# Patient Record
Sex: Female | Born: 1935 | Race: White | Hispanic: No | Smoking: Never smoker
Health system: Southern US, Community
[De-identification: ages and names within clinical notes are randomized; demographics above are authoritative.]

## PROBLEM LIST (undated history)

## (undated) DIAGNOSIS — K219 Gastro-esophageal reflux disease without esophagitis: Secondary | ICD-10-CM

## (undated) DIAGNOSIS — J439 Emphysema, unspecified: Secondary | ICD-10-CM

## (undated) DIAGNOSIS — D510 Vitamin B12 deficiency anemia due to intrinsic factor deficiency: Secondary | ICD-10-CM

## (undated) DIAGNOSIS — M545 Low back pain, unspecified: Secondary | ICD-10-CM

## (undated) DIAGNOSIS — G8929 Other chronic pain: Secondary | ICD-10-CM

## (undated) DIAGNOSIS — S32599A Other specified fracture of unspecified pubis, initial encounter for closed fracture: Secondary | ICD-10-CM

## (undated) DIAGNOSIS — IMO0002 Reserved for concepts with insufficient information to code with codable children: Secondary | ICD-10-CM

## (undated) DIAGNOSIS — Z8619 Personal history of other infectious and parasitic diseases: Secondary | ICD-10-CM

## (undated) DIAGNOSIS — R609 Edema, unspecified: Secondary | ICD-10-CM

## (undated) DIAGNOSIS — N182 Chronic kidney disease, stage 2 (mild): Secondary | ICD-10-CM

## (undated) DIAGNOSIS — G629 Polyneuropathy, unspecified: Secondary | ICD-10-CM

## (undated) DIAGNOSIS — T7840XA Allergy, unspecified, initial encounter: Secondary | ICD-10-CM

## (undated) DIAGNOSIS — E669 Obesity, unspecified: Secondary | ICD-10-CM

## (undated) DIAGNOSIS — T50905A Adverse effect of unspecified drugs, medicaments and biological substances, initial encounter: Secondary | ICD-10-CM

## (undated) DIAGNOSIS — R739 Hyperglycemia, unspecified: Secondary | ICD-10-CM

## (undated) DIAGNOSIS — E538 Deficiency of other specified B group vitamins: Secondary | ICD-10-CM

## (undated) DIAGNOSIS — I10 Essential (primary) hypertension: Secondary | ICD-10-CM

## (undated) DIAGNOSIS — D696 Thrombocytopenia, unspecified: Secondary | ICD-10-CM

## (undated) HISTORY — PX: ROTATOR CUFF REPAIR: SHX139

## (undated) HISTORY — PX: VEIN SURGERY: SHX48

## (undated) HISTORY — DX: Polyneuropathy, unspecified: G62.9

## (undated) HISTORY — DX: Essential (primary) hypertension: I10

## (undated) HISTORY — PX: ORIF ANKLE FRACTURE: SHX5408

## (undated) HISTORY — DX: Deficiency of other specified B group vitamins: E53.8

## (undated) HISTORY — DX: Low back pain: M54.5

## (undated) HISTORY — DX: Allergy, unspecified, initial encounter: T78.40XA

## (undated) HISTORY — DX: Chronic kidney disease, stage 2 (mild): N18.2

## (undated) HISTORY — PX: TOTAL ABDOMINAL HYSTERECTOMY W/ BILATERAL SALPINGOOPHORECTOMY: SHX83

## (undated) HISTORY — DX: Other chronic pain: G89.29

## (undated) HISTORY — DX: Reserved for concepts with insufficient information to code with codable children: IMO0002

## (undated) HISTORY — DX: Low back pain, unspecified: M54.50

## (undated) HISTORY — DX: Gastro-esophageal reflux disease without esophagitis: K21.9

## (undated) HISTORY — PX: CARPAL TUNNEL RELEASE: SHX101

## (undated) HISTORY — DX: Vitamin B12 deficiency anemia due to intrinsic factor deficiency: D51.0

## (undated) HISTORY — DX: Edema, unspecified: R60.9

## (undated) HISTORY — DX: Obesity, unspecified: E66.9

## (undated) HISTORY — DX: Personal history of other infectious and parasitic diseases: Z86.19

---

## 1999-12-13 ENCOUNTER — Other Ambulatory Visit: Admission: RE | Admit: 1999-12-13 | Discharge: 1999-12-13 | Payer: Self-pay | Admitting: Radiology

## 1999-12-13 ENCOUNTER — Encounter (INDEPENDENT_AMBULATORY_CARE_PROVIDER_SITE_OTHER): Payer: Self-pay | Admitting: Specialist

## 2001-10-13 ENCOUNTER — Ambulatory Visit (HOSPITAL_COMMUNITY): Admission: RE | Admit: 2001-10-13 | Discharge: 2001-10-13 | Payer: Self-pay | Admitting: Family Medicine

## 2001-10-13 ENCOUNTER — Encounter: Payer: Self-pay | Admitting: Family Medicine

## 2003-04-26 ENCOUNTER — Encounter: Payer: Self-pay | Admitting: Family Medicine

## 2003-04-26 ENCOUNTER — Ambulatory Visit (HOSPITAL_COMMUNITY): Admission: RE | Admit: 2003-04-26 | Discharge: 2003-04-26 | Payer: Self-pay | Admitting: Family Medicine

## 2003-05-19 ENCOUNTER — Ambulatory Visit (HOSPITAL_COMMUNITY): Admission: RE | Admit: 2003-05-19 | Discharge: 2003-05-19 | Payer: Self-pay | Admitting: Internal Medicine

## 2003-05-19 ENCOUNTER — Encounter: Payer: Self-pay | Admitting: Internal Medicine

## 2003-05-20 ENCOUNTER — Encounter: Payer: Self-pay | Admitting: Family Medicine

## 2003-05-20 ENCOUNTER — Ambulatory Visit (HOSPITAL_COMMUNITY): Admission: RE | Admit: 2003-05-20 | Discharge: 2003-05-20 | Payer: Self-pay | Admitting: Family Medicine

## 2003-11-21 ENCOUNTER — Emergency Department (HOSPITAL_COMMUNITY): Admission: EM | Admit: 2003-11-21 | Discharge: 2003-11-21 | Payer: Self-pay | Admitting: Emergency Medicine

## 2005-05-11 ENCOUNTER — Ambulatory Visit (HOSPITAL_COMMUNITY): Admission: RE | Admit: 2005-05-11 | Discharge: 2005-05-11 | Payer: Self-pay | Admitting: Family Medicine

## 2006-01-21 ENCOUNTER — Ambulatory Visit (HOSPITAL_COMMUNITY): Admission: RE | Admit: 2006-01-21 | Discharge: 2006-01-21 | Payer: Self-pay | Admitting: Family Medicine

## 2006-03-06 ENCOUNTER — Emergency Department (HOSPITAL_COMMUNITY): Admission: EM | Admit: 2006-03-06 | Discharge: 2006-03-06 | Payer: Self-pay | Admitting: Emergency Medicine

## 2006-03-07 ENCOUNTER — Emergency Department (HOSPITAL_COMMUNITY): Admission: EM | Admit: 2006-03-07 | Discharge: 2006-03-07 | Payer: Self-pay | Admitting: Emergency Medicine

## 2006-09-16 ENCOUNTER — Ambulatory Visit (HOSPITAL_COMMUNITY): Admission: RE | Admit: 2006-09-16 | Discharge: 2006-09-16 | Payer: Self-pay | Admitting: General Surgery

## 2006-09-16 ENCOUNTER — Encounter (INDEPENDENT_AMBULATORY_CARE_PROVIDER_SITE_OTHER): Payer: Self-pay | Admitting: Specialist

## 2006-09-19 ENCOUNTER — Ambulatory Visit (HOSPITAL_COMMUNITY): Admission: RE | Admit: 2006-09-19 | Discharge: 2006-09-19 | Payer: Self-pay | Admitting: General Surgery

## 2006-09-26 ENCOUNTER — Ambulatory Visit (HOSPITAL_COMMUNITY): Admission: RE | Admit: 2006-09-26 | Discharge: 2006-09-26 | Payer: Self-pay | Admitting: *Deleted

## 2006-10-01 ENCOUNTER — Ambulatory Visit (HOSPITAL_COMMUNITY): Payer: Self-pay | Admitting: Oncology

## 2006-10-01 ENCOUNTER — Encounter (HOSPITAL_COMMUNITY): Admission: RE | Admit: 2006-10-01 | Discharge: 2006-10-21 | Payer: Self-pay | Admitting: Oncology

## 2006-10-02 ENCOUNTER — Encounter (INDEPENDENT_AMBULATORY_CARE_PROVIDER_SITE_OTHER): Payer: Self-pay | Admitting: Specialist

## 2006-10-03 ENCOUNTER — Ambulatory Visit (HOSPITAL_COMMUNITY): Admission: RE | Admit: 2006-10-03 | Discharge: 2006-10-03 | Payer: Self-pay | Admitting: Oncology

## 2006-10-23 ENCOUNTER — Encounter (HOSPITAL_COMMUNITY): Admission: RE | Admit: 2006-10-23 | Discharge: 2006-11-22 | Payer: Self-pay | Admitting: Oncology

## 2006-11-18 ENCOUNTER — Ambulatory Visit (HOSPITAL_COMMUNITY): Payer: Self-pay | Admitting: Oncology

## 2006-11-25 ENCOUNTER — Encounter (HOSPITAL_COMMUNITY): Admission: RE | Admit: 2006-11-25 | Discharge: 2006-12-25 | Payer: Self-pay | Admitting: Oncology

## 2007-01-27 ENCOUNTER — Encounter (HOSPITAL_COMMUNITY): Admission: RE | Admit: 2007-01-27 | Discharge: 2007-02-26 | Payer: Self-pay | Admitting: Oncology

## 2007-01-27 ENCOUNTER — Ambulatory Visit (HOSPITAL_COMMUNITY): Payer: Self-pay | Admitting: Oncology

## 2007-03-21 ENCOUNTER — Encounter (HOSPITAL_COMMUNITY): Admission: RE | Admit: 2007-03-21 | Discharge: 2007-04-20 | Payer: Self-pay | Admitting: Oncology

## 2007-03-21 ENCOUNTER — Ambulatory Visit (HOSPITAL_COMMUNITY): Payer: Self-pay | Admitting: Oncology

## 2007-03-24 ENCOUNTER — Ambulatory Visit: Payer: Self-pay | Admitting: Cardiology

## 2007-03-24 ENCOUNTER — Ambulatory Visit (HOSPITAL_COMMUNITY): Admission: RE | Admit: 2007-03-24 | Discharge: 2007-03-24 | Payer: Self-pay | Admitting: Cardiology

## 2007-03-25 ENCOUNTER — Ambulatory Visit: Payer: Self-pay | Admitting: Cardiology

## 2007-03-25 ENCOUNTER — Ambulatory Visit (HOSPITAL_COMMUNITY): Admission: RE | Admit: 2007-03-25 | Discharge: 2007-03-25 | Payer: Self-pay | Admitting: Cardiology

## 2007-04-15 ENCOUNTER — Ambulatory Visit: Payer: Self-pay | Admitting: Cardiology

## 2007-05-19 ENCOUNTER — Ambulatory Visit (HOSPITAL_COMMUNITY): Payer: Self-pay | Admitting: Oncology

## 2007-05-19 ENCOUNTER — Encounter (HOSPITAL_COMMUNITY): Admission: RE | Admit: 2007-05-19 | Discharge: 2007-06-18 | Payer: Self-pay | Admitting: Oncology

## 2007-06-30 ENCOUNTER — Encounter (HOSPITAL_COMMUNITY): Admission: RE | Admit: 2007-06-30 | Discharge: 2007-07-22 | Payer: Self-pay | Admitting: Oncology

## 2007-07-21 ENCOUNTER — Ambulatory Visit (HOSPITAL_COMMUNITY): Payer: Self-pay | Admitting: Oncology

## 2007-07-28 ENCOUNTER — Encounter (HOSPITAL_COMMUNITY): Admission: RE | Admit: 2007-07-28 | Discharge: 2007-08-27 | Payer: Self-pay | Admitting: Oncology

## 2007-09-03 ENCOUNTER — Encounter (HOSPITAL_COMMUNITY): Admission: RE | Admit: 2007-09-03 | Discharge: 2007-10-03 | Payer: Self-pay | Admitting: Oncology

## 2007-09-24 ENCOUNTER — Ambulatory Visit (HOSPITAL_COMMUNITY): Payer: Self-pay | Admitting: Oncology

## 2007-10-06 ENCOUNTER — Encounter (HOSPITAL_COMMUNITY): Admission: RE | Admit: 2007-10-06 | Discharge: 2007-10-22 | Payer: Self-pay | Admitting: Oncology

## 2007-11-03 ENCOUNTER — Encounter (HOSPITAL_COMMUNITY): Admission: RE | Admit: 2007-11-03 | Discharge: 2007-12-03 | Payer: Self-pay | Admitting: Oncology

## 2007-11-17 ENCOUNTER — Ambulatory Visit (HOSPITAL_COMMUNITY): Payer: Self-pay | Admitting: Oncology

## 2007-12-18 ENCOUNTER — Encounter (HOSPITAL_COMMUNITY): Admission: RE | Admit: 2007-12-18 | Discharge: 2008-01-17 | Payer: Self-pay | Admitting: Oncology

## 2008-01-12 ENCOUNTER — Ambulatory Visit (HOSPITAL_COMMUNITY): Payer: Self-pay | Admitting: Oncology

## 2008-01-29 ENCOUNTER — Encounter (HOSPITAL_COMMUNITY): Admission: RE | Admit: 2008-01-29 | Discharge: 2008-02-28 | Payer: Self-pay | Admitting: Oncology

## 2008-02-27 ENCOUNTER — Ambulatory Visit (HOSPITAL_COMMUNITY): Payer: Self-pay | Admitting: Oncology

## 2008-03-05 ENCOUNTER — Encounter (HOSPITAL_COMMUNITY): Admission: RE | Admit: 2008-03-05 | Discharge: 2008-04-04 | Payer: Self-pay | Admitting: Oncology

## 2008-04-15 ENCOUNTER — Ambulatory Visit (HOSPITAL_COMMUNITY): Admission: RE | Admit: 2008-04-15 | Discharge: 2008-04-15 | Payer: Self-pay | Admitting: Family Medicine

## 2008-04-21 ENCOUNTER — Ambulatory Visit (HOSPITAL_COMMUNITY): Payer: Self-pay | Admitting: Oncology

## 2008-04-21 ENCOUNTER — Encounter (HOSPITAL_COMMUNITY): Admission: RE | Admit: 2008-04-21 | Discharge: 2008-05-21 | Payer: Self-pay | Admitting: Oncology

## 2008-05-28 ENCOUNTER — Encounter (HOSPITAL_COMMUNITY): Admission: RE | Admit: 2008-05-28 | Discharge: 2008-06-27 | Payer: Self-pay | Admitting: Oncology

## 2008-06-25 ENCOUNTER — Ambulatory Visit (HOSPITAL_COMMUNITY): Payer: Self-pay | Admitting: Oncology

## 2008-07-13 ENCOUNTER — Encounter (HOSPITAL_COMMUNITY): Admission: RE | Admit: 2008-07-13 | Discharge: 2008-07-19 | Payer: Self-pay | Admitting: Oncology

## 2008-07-13 ENCOUNTER — Encounter (HOSPITAL_COMMUNITY): Payer: Self-pay | Admitting: Oncology

## 2008-07-19 ENCOUNTER — Ambulatory Visit (HOSPITAL_COMMUNITY): Admission: RE | Admit: 2008-07-19 | Discharge: 2008-07-19 | Payer: Self-pay | Admitting: Oncology

## 2008-07-28 ENCOUNTER — Encounter (HOSPITAL_COMMUNITY): Admission: RE | Admit: 2008-07-28 | Discharge: 2008-08-27 | Payer: Self-pay | Admitting: Oncology

## 2008-08-25 ENCOUNTER — Ambulatory Visit (HOSPITAL_COMMUNITY): Payer: Self-pay | Admitting: Oncology

## 2008-08-31 ENCOUNTER — Encounter (HOSPITAL_COMMUNITY): Admission: RE | Admit: 2008-08-31 | Discharge: 2008-09-30 | Payer: Self-pay | Admitting: Oncology

## 2008-10-05 ENCOUNTER — Encounter (HOSPITAL_COMMUNITY): Admission: RE | Admit: 2008-10-05 | Discharge: 2008-11-04 | Payer: Self-pay | Admitting: Oncology

## 2008-10-12 ENCOUNTER — Ambulatory Visit (HOSPITAL_COMMUNITY): Admission: RE | Admit: 2008-10-12 | Discharge: 2008-10-12 | Payer: Self-pay | Admitting: Ophthalmology

## 2008-10-15 ENCOUNTER — Emergency Department (HOSPITAL_COMMUNITY): Admission: EM | Admit: 2008-10-15 | Discharge: 2008-10-15 | Payer: Self-pay | Admitting: Emergency Medicine

## 2008-10-26 ENCOUNTER — Ambulatory Visit (HOSPITAL_COMMUNITY): Admission: RE | Admit: 2008-10-26 | Discharge: 2008-10-26 | Payer: Self-pay | Admitting: Family Medicine

## 2008-10-28 ENCOUNTER — Emergency Department (HOSPITAL_COMMUNITY): Admission: EM | Admit: 2008-10-28 | Discharge: 2008-10-28 | Payer: Self-pay | Admitting: Emergency Medicine

## 2008-11-02 ENCOUNTER — Ambulatory Visit (HOSPITAL_COMMUNITY): Admission: RE | Admit: 2008-11-02 | Discharge: 2008-11-02 | Payer: Self-pay | Admitting: Ophthalmology

## 2008-11-05 ENCOUNTER — Encounter (HOSPITAL_COMMUNITY): Admission: RE | Admit: 2008-11-05 | Discharge: 2008-12-05 | Payer: Self-pay | Admitting: Oncology

## 2008-11-05 ENCOUNTER — Ambulatory Visit (HOSPITAL_COMMUNITY): Payer: Self-pay | Admitting: Oncology

## 2008-12-07 ENCOUNTER — Encounter (HOSPITAL_COMMUNITY): Admission: RE | Admit: 2008-12-07 | Discharge: 2009-01-06 | Payer: Self-pay | Admitting: Oncology

## 2008-12-21 ENCOUNTER — Ambulatory Visit (HOSPITAL_COMMUNITY): Payer: Self-pay | Admitting: Oncology

## 2008-12-24 ENCOUNTER — Encounter (INDEPENDENT_AMBULATORY_CARE_PROVIDER_SITE_OTHER): Payer: Self-pay | Admitting: *Deleted

## 2009-01-18 ENCOUNTER — Encounter (HOSPITAL_COMMUNITY): Admission: RE | Admit: 2009-01-18 | Discharge: 2009-02-18 | Payer: Self-pay | Admitting: Oncology

## 2009-01-19 ENCOUNTER — Encounter: Admission: RE | Admit: 2009-01-19 | Discharge: 2009-01-19 | Payer: Self-pay | Admitting: Orthopedic Surgery

## 2009-03-01 ENCOUNTER — Encounter (HOSPITAL_COMMUNITY): Admission: RE | Admit: 2009-03-01 | Discharge: 2009-03-31 | Payer: Self-pay | Admitting: Oncology

## 2009-03-01 ENCOUNTER — Ambulatory Visit (HOSPITAL_COMMUNITY): Payer: Self-pay | Admitting: Oncology

## 2009-03-23 ENCOUNTER — Encounter: Payer: Self-pay | Admitting: Cardiology

## 2009-03-29 ENCOUNTER — Encounter (HOSPITAL_COMMUNITY): Admission: RE | Admit: 2009-03-29 | Discharge: 2009-04-28 | Payer: Self-pay | Admitting: Oncology

## 2009-04-20 ENCOUNTER — Ambulatory Visit (HOSPITAL_COMMUNITY): Payer: Self-pay | Admitting: Oncology

## 2009-04-29 DIAGNOSIS — R609 Edema, unspecified: Secondary | ICD-10-CM | POA: Insufficient documentation

## 2009-04-29 DIAGNOSIS — I10 Essential (primary) hypertension: Secondary | ICD-10-CM

## 2009-05-10 ENCOUNTER — Encounter (HOSPITAL_COMMUNITY): Admission: RE | Admit: 2009-05-10 | Discharge: 2009-06-09 | Payer: Self-pay | Admitting: Oncology

## 2009-06-03 ENCOUNTER — Encounter (INDEPENDENT_AMBULATORY_CARE_PROVIDER_SITE_OTHER): Payer: Self-pay | Admitting: *Deleted

## 2009-06-09 ENCOUNTER — Ambulatory Visit (HOSPITAL_COMMUNITY): Payer: Self-pay | Admitting: Oncology

## 2009-06-23 ENCOUNTER — Encounter (HOSPITAL_COMMUNITY): Admission: RE | Admit: 2009-06-23 | Discharge: 2009-07-21 | Payer: Self-pay | Admitting: Oncology

## 2009-08-04 ENCOUNTER — Ambulatory Visit (HOSPITAL_COMMUNITY): Payer: Self-pay | Admitting: Oncology

## 2009-08-04 ENCOUNTER — Encounter (HOSPITAL_COMMUNITY): Admission: RE | Admit: 2009-08-04 | Discharge: 2009-09-03 | Payer: Self-pay | Admitting: Oncology

## 2009-09-19 ENCOUNTER — Ambulatory Visit (HOSPITAL_COMMUNITY): Payer: Self-pay | Admitting: Oncology

## 2009-09-19 ENCOUNTER — Encounter (HOSPITAL_COMMUNITY): Admission: RE | Admit: 2009-09-19 | Discharge: 2009-10-19 | Payer: Self-pay | Admitting: Oncology

## 2009-10-01 ENCOUNTER — Observation Stay (HOSPITAL_COMMUNITY): Admission: EM | Admit: 2009-10-01 | Discharge: 2009-10-02 | Payer: Self-pay | Admitting: Emergency Medicine

## 2009-11-03 ENCOUNTER — Ambulatory Visit (HOSPITAL_COMMUNITY): Payer: Self-pay | Admitting: Oncology

## 2009-11-03 ENCOUNTER — Encounter (HOSPITAL_COMMUNITY): Admission: RE | Admit: 2009-11-03 | Discharge: 2009-12-03 | Payer: Self-pay | Admitting: Oncology

## 2009-11-15 ENCOUNTER — Ambulatory Visit (HOSPITAL_COMMUNITY): Admission: RE | Admit: 2009-11-15 | Discharge: 2009-11-15 | Payer: Self-pay | Admitting: General Surgery

## 2009-12-13 ENCOUNTER — Encounter (HOSPITAL_COMMUNITY): Admission: RE | Admit: 2009-12-13 | Discharge: 2010-01-12 | Payer: Self-pay | Admitting: Oncology

## 2009-12-22 ENCOUNTER — Ambulatory Visit (HOSPITAL_COMMUNITY): Payer: Self-pay | Admitting: Oncology

## 2010-01-11 ENCOUNTER — Ambulatory Visit (HOSPITAL_COMMUNITY): Admission: RE | Admit: 2010-01-11 | Discharge: 2010-01-11 | Payer: Self-pay | Admitting: General Surgery

## 2010-01-11 ENCOUNTER — Encounter (HOSPITAL_COMMUNITY): Admission: RE | Admit: 2010-01-11 | Discharge: 2010-02-10 | Payer: Self-pay | Admitting: Oncology

## 2010-02-08 ENCOUNTER — Ambulatory Visit (HOSPITAL_COMMUNITY): Admission: RE | Admit: 2010-02-08 | Discharge: 2010-02-08 | Payer: Self-pay | Admitting: Family Medicine

## 2010-02-20 ENCOUNTER — Encounter (HOSPITAL_COMMUNITY): Admission: RE | Admit: 2010-02-20 | Discharge: 2010-03-22 | Payer: Self-pay | Admitting: Oncology

## 2010-02-20 ENCOUNTER — Ambulatory Visit (HOSPITAL_COMMUNITY): Payer: Self-pay | Admitting: Oncology

## 2010-03-23 ENCOUNTER — Encounter (HOSPITAL_COMMUNITY): Admission: RE | Admit: 2010-03-23 | Discharge: 2010-04-22 | Payer: Self-pay | Admitting: Oncology

## 2010-04-11 ENCOUNTER — Ambulatory Visit (HOSPITAL_COMMUNITY): Payer: Self-pay | Admitting: Oncology

## 2010-05-02 ENCOUNTER — Encounter (HOSPITAL_COMMUNITY): Admission: RE | Admit: 2010-05-02 | Discharge: 2010-06-01 | Payer: Self-pay | Admitting: Oncology

## 2010-06-09 ENCOUNTER — Encounter (HOSPITAL_COMMUNITY): Admission: RE | Admit: 2010-06-09 | Discharge: 2010-07-09 | Payer: Self-pay | Admitting: Oncology

## 2010-06-12 IMAGING — CR DG LUMBAR SPINE COMPLETE 4+V
5 series · 5 of 5 positions shown · non-contrast
Comparison: None

CLINICAL DATA: Low back pain into pelvis, history multiple myeloma

LUMBAR SPINE - COMPLETE 4+ VIEW

[view not recorded (1 of 5)]
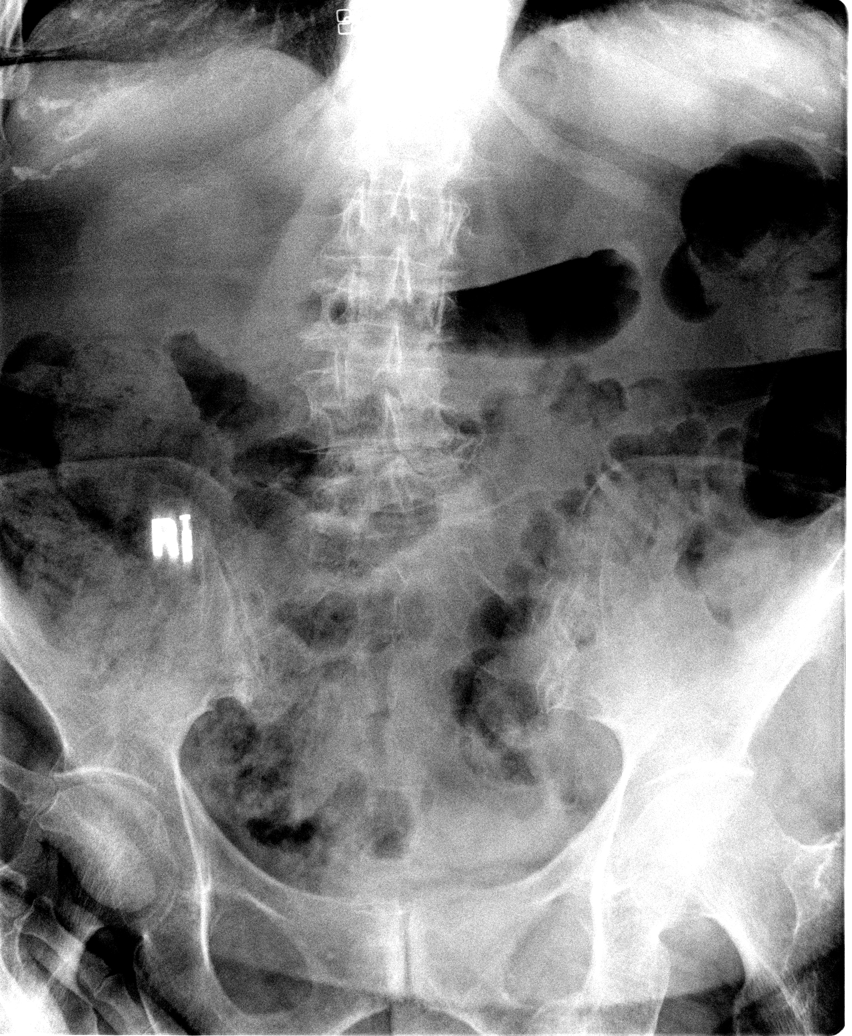

[view not recorded (2 of 5)]
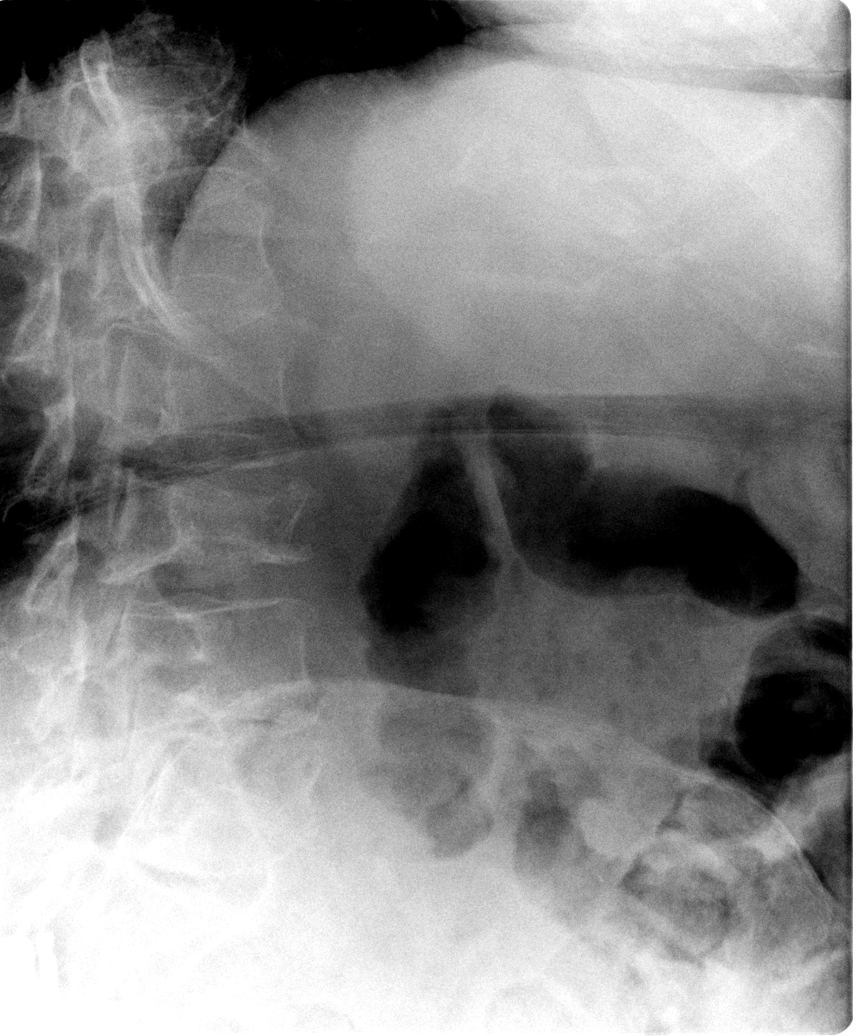

[view not recorded (3 of 5)]
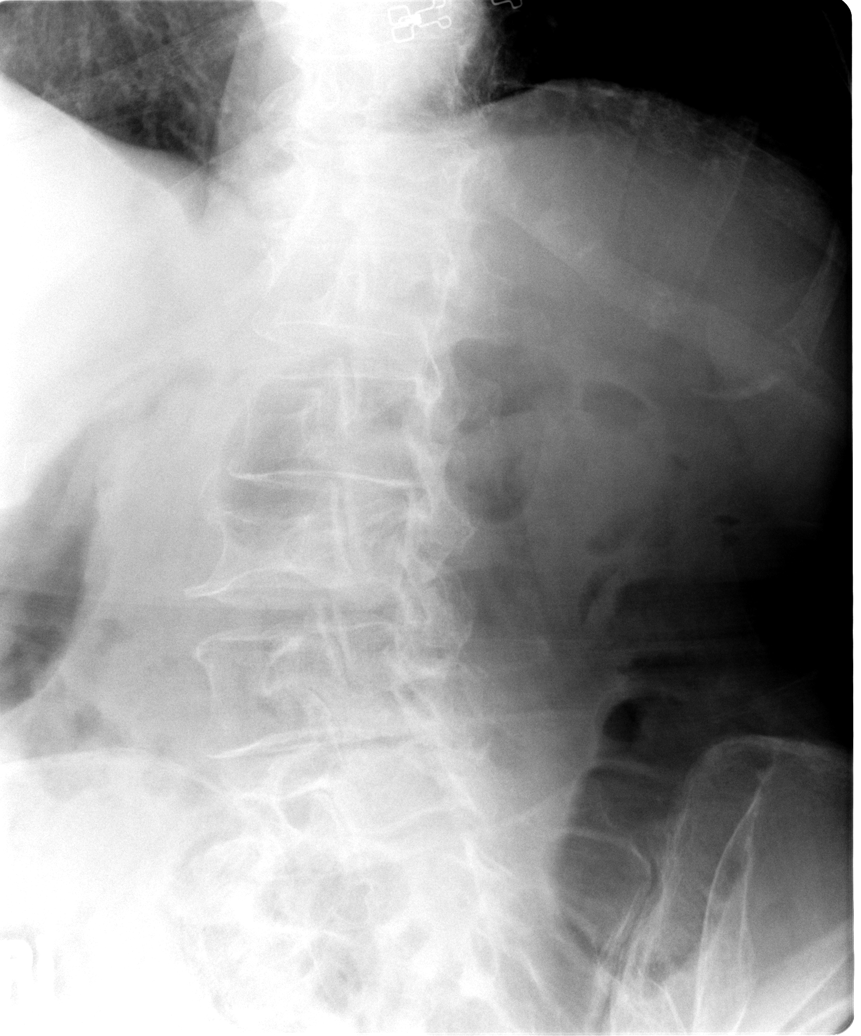

[view not recorded (4 of 5)]
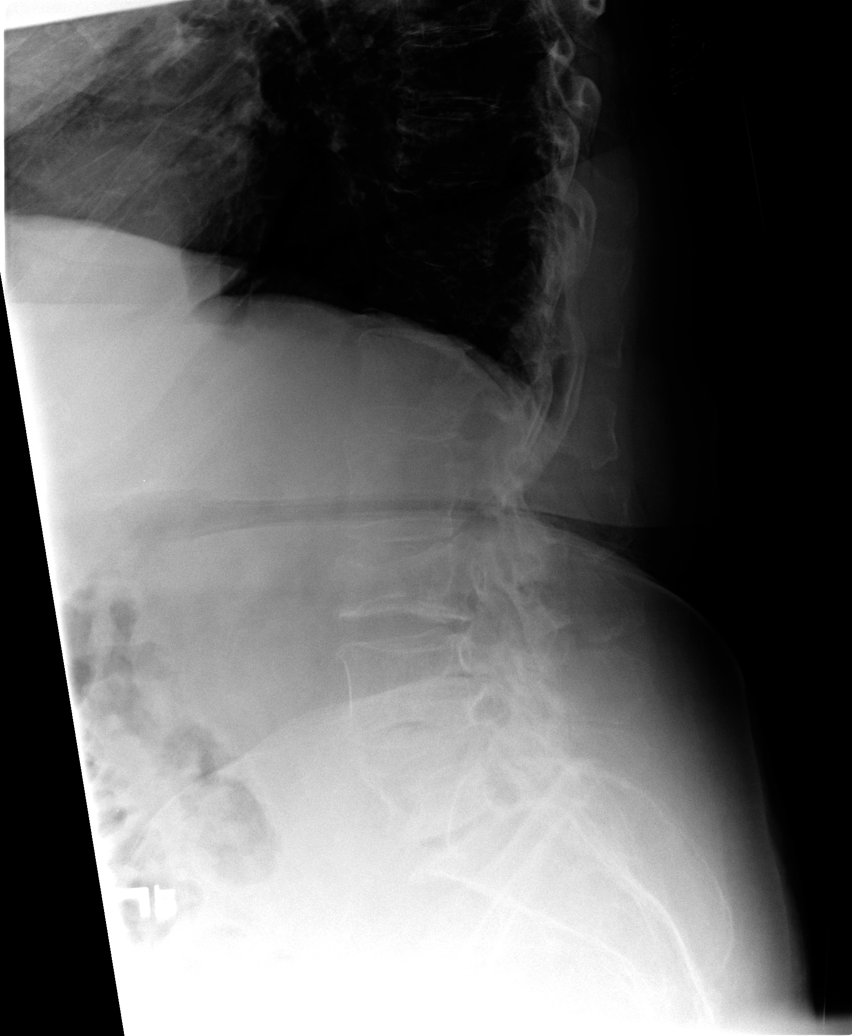

[view not recorded (5 of 5)]
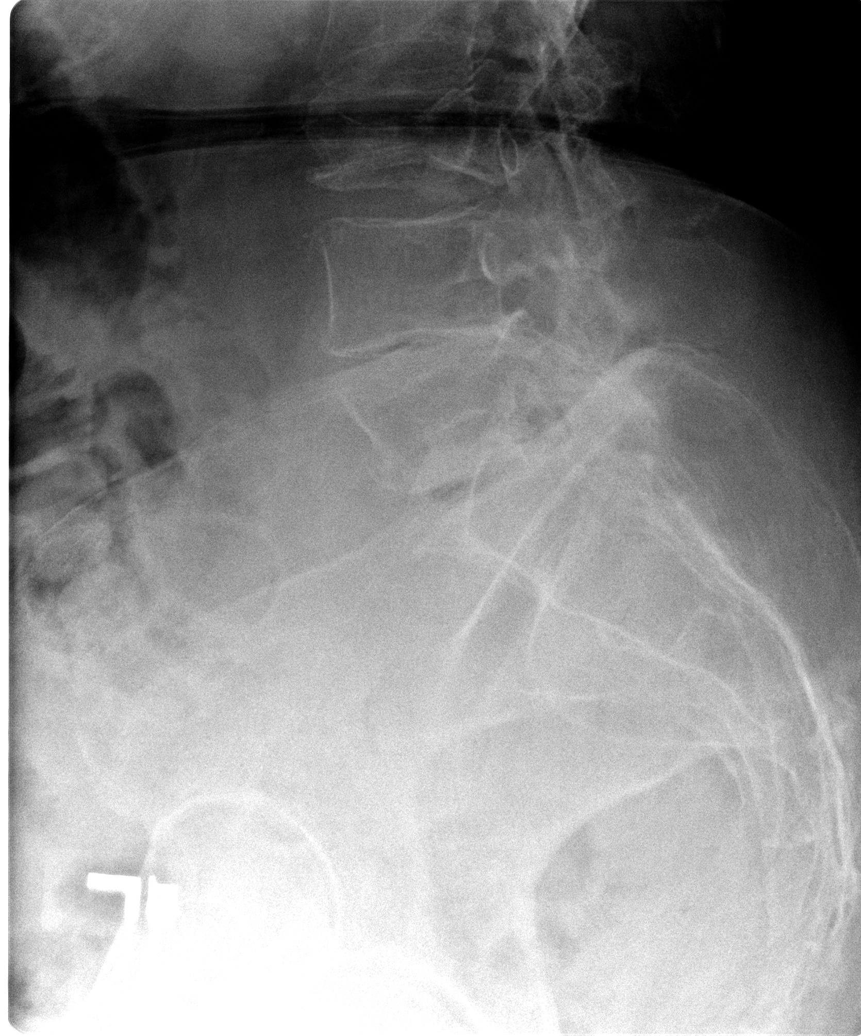

[5 of 5 positions shown; findings below may reference images not displayed]

FINDINGS: Five lumbar vertebrae.
Marked bony demineralization.
Dextroconvex scoliosis apex L3.
Marked compression deformity of L3 vertebral body, approximately
50% anterior height loss, progressive since sagittal images from
prior CT abdomen of 10/15/2008.
Mild anterior height losses at T11 and T12 unchanged.
Facet degenerative changes lower lumbar spine.
No gross evidence of spondylolysis.
IMPRESSION: Marked bony demineralization with stable compression deformities at
T11 and T12 and increased compression fracture of L3 vertebral
body, now demonstrating 50% height loss.

## 2010-06-15 ENCOUNTER — Ambulatory Visit (HOSPITAL_COMMUNITY): Payer: Self-pay | Admitting: Oncology

## 2010-06-29 ENCOUNTER — Ambulatory Visit: Payer: Self-pay | Admitting: Cardiovascular Disease

## 2010-06-29 ENCOUNTER — Encounter (HOSPITAL_COMMUNITY): Payer: Self-pay | Admitting: Oncology

## 2010-07-13 ENCOUNTER — Encounter (HOSPITAL_COMMUNITY): Admission: RE | Admit: 2010-07-13 | Discharge: 2010-07-21 | Payer: Self-pay | Admitting: Oncology

## 2010-07-28 ENCOUNTER — Encounter (HOSPITAL_COMMUNITY)
Admission: RE | Admit: 2010-07-28 | Discharge: 2010-08-27 | Payer: Self-pay | Source: Home / Self Care | Admitting: Oncology

## 2010-08-03 ENCOUNTER — Ambulatory Visit (HOSPITAL_COMMUNITY): Payer: Self-pay | Admitting: Oncology

## 2010-09-01 ENCOUNTER — Encounter (HOSPITAL_COMMUNITY)
Admission: RE | Admit: 2010-09-01 | Discharge: 2010-10-01 | Payer: Self-pay | Source: Home / Self Care | Attending: Oncology | Admitting: Oncology

## 2010-09-18 ENCOUNTER — Ambulatory Visit (HOSPITAL_COMMUNITY): Payer: Self-pay | Admitting: Oncology

## 2010-09-22 ENCOUNTER — Ambulatory Visit (HOSPITAL_COMMUNITY): Payer: Self-pay | Admitting: Oncology

## 2010-09-22 ENCOUNTER — Encounter (HOSPITAL_COMMUNITY): Payer: Self-pay | Admitting: Oncology

## 2010-09-22 ENCOUNTER — Ambulatory Visit: Payer: Self-pay | Admitting: Cardiology

## 2010-10-19 ENCOUNTER — Encounter (HOSPITAL_COMMUNITY)
Admission: RE | Admit: 2010-10-19 | Discharge: 2010-11-18 | Payer: Self-pay | Source: Home / Self Care | Attending: Oncology | Admitting: Oncology

## 2010-10-22 HISTORY — PX: CENTRAL VENOUS CATHETER TUNNELED INSERTION SINGLE LUMEN: SHX1325

## 2010-11-06 ENCOUNTER — Ambulatory Visit (HOSPITAL_COMMUNITY)
Admission: RE | Admit: 2010-11-06 | Discharge: 2010-11-21 | Payer: Self-pay | Source: Home / Self Care | Attending: Oncology | Admitting: Oncology

## 2010-11-12 ENCOUNTER — Encounter: Payer: Self-pay | Admitting: Family Medicine

## 2010-11-12 ENCOUNTER — Encounter: Payer: Self-pay | Admitting: General Surgery

## 2010-11-13 LAB — COMPREHENSIVE METABOLIC PANEL
ALT: 20 U/L (ref 0–35)
AST: 18 U/L (ref 0–37)
Albumin: 3.7 g/dL (ref 3.5–5.2)
BUN: 33 mg/dL — ABNORMAL HIGH (ref 6–23)
Calcium: 9.6 mg/dL (ref 8.4–10.5)
Chloride: 99 mEq/L (ref 96–112)
GFR calc non Af Amer: 48 mL/min — ABNORMAL LOW (ref >60)
Glucose, Bld: 81 mg/dL (ref 70–99)
Potassium: 4.9 mEq/L (ref 3.5–5.1)
Total Protein: 5.8 g/dL — ABNORMAL LOW (ref 6.0–8.3)

## 2010-11-13 LAB — CBC
HCT: 31.3 % — ABNORMAL LOW (ref 36.0–46.0)
MCH: 34.9 pg — ABNORMAL HIGH (ref 26.0–34.0)
MCHC: 34.8 g/dL (ref 30.0–36.0)
MCV: 100.3 fL — ABNORMAL HIGH (ref 78.0–100.0)
Platelets: 100 10*3/uL — ABNORMAL LOW (ref 150–400)
RDW: 16.2 % — ABNORMAL HIGH (ref 11.5–15.5)

## 2010-11-13 LAB — DIFFERENTIAL
Eosinophils Absolute: 0 10*3/uL (ref 0.0–0.7)
Eosinophils Relative: 0 % (ref 0–5)
Lymphs Abs: 1 10*3/uL (ref 0.7–4.0)
Monocytes Absolute: 0.6 10*3/uL (ref 0.1–1.0)
Monocytes Relative: 5 % (ref 3–12)

## 2010-11-15 LAB — PROTEIN ELECTROPHORESIS, SERUM
Albumin ELP: 60.6 % (ref 55.8–66.1)
Beta 2: 4 % (ref 3.2–6.5)
M-Spike, %: NOT DETECTED g/dL
Total Protein ELP: 6.4 g/dL (ref 6.0–8.3)

## 2010-11-15 LAB — IMMUNOFIXATION ELECTROPHORESIS
IgA: 72 mg/dL (ref 68–378)
IgM, Serum: 29 mg/dL — ABNORMAL LOW (ref 60–263)

## 2010-11-20 ENCOUNTER — Encounter (HOSPITAL_COMMUNITY)
Admission: RE | Admit: 2010-11-20 | Discharge: 2010-11-21 | Payer: Self-pay | Source: Home / Self Care | Attending: Oncology | Admitting: Oncology

## 2010-11-22 ENCOUNTER — Ambulatory Visit (HOSPITAL_COMMUNITY): Payer: MEDICARE | Admitting: Oncology

## 2010-11-22 ENCOUNTER — Other Ambulatory Visit (HOSPITAL_COMMUNITY): Payer: Self-pay | Admitting: Oncology

## 2010-11-22 ENCOUNTER — Ambulatory Visit (HOSPITAL_COMMUNITY)
Admission: RE | Admit: 2010-11-22 | Discharge: 2010-11-22 | Disposition: A | Discharge status: Home / Self Care | Payer: MEDICARE | Source: Ambulatory Visit | Attending: Oncology | Admitting: Oncology

## 2010-11-22 ENCOUNTER — Encounter (HOSPITAL_COMMUNITY): Payer: Self-pay | Admitting: Oncology

## 2010-11-22 DIAGNOSIS — R609 Edema, unspecified: Secondary | ICD-10-CM

## 2010-11-22 DIAGNOSIS — R52 Pain, unspecified: Secondary | ICD-10-CM

## 2010-11-22 DIAGNOSIS — M79609 Pain in unspecified limb: Secondary | ICD-10-CM | POA: Insufficient documentation

## 2010-11-22 DIAGNOSIS — M7989 Other specified soft tissue disorders: Secondary | ICD-10-CM | POA: Insufficient documentation

## 2010-11-22 DIAGNOSIS — C9000 Multiple myeloma not having achieved remission: Secondary | ICD-10-CM

## 2010-11-24 ENCOUNTER — Inpatient Hospital Stay (HOSPITAL_COMMUNITY): Payer: MEDICARE

## 2010-11-24 DIAGNOSIS — C9000 Multiple myeloma not having achieved remission: Secondary | ICD-10-CM

## 2010-11-24 DIAGNOSIS — Z5112 Encounter for antineoplastic immunotherapy: Secondary | ICD-10-CM

## 2010-11-27 ENCOUNTER — Inpatient Hospital Stay (HOSPITAL_COMMUNITY): Payer: MEDICARE

## 2010-11-27 ENCOUNTER — Ambulatory Visit (HOSPITAL_COMMUNITY): Payer: MEDICARE | Admitting: Oncology

## 2010-11-27 ENCOUNTER — Other Ambulatory Visit (HOSPITAL_COMMUNITY): Payer: Self-pay | Admitting: Oncology

## 2010-11-27 ENCOUNTER — Encounter (HOSPITAL_COMMUNITY): Payer: Medicare Other | Attending: Oncology

## 2010-11-27 ENCOUNTER — Inpatient Hospital Stay (HOSPITAL_COMMUNITY): Payer: Self-pay

## 2010-11-27 DIAGNOSIS — Z5112 Encounter for antineoplastic immunotherapy: Secondary | ICD-10-CM

## 2010-11-27 DIAGNOSIS — C9000 Multiple myeloma not having achieved remission: Secondary | ICD-10-CM

## 2010-11-27 DIAGNOSIS — Z79899 Other long term (current) drug therapy: Secondary | ICD-10-CM | POA: Insufficient documentation

## 2010-11-27 DIAGNOSIS — E538 Deficiency of other specified B group vitamins: Secondary | ICD-10-CM | POA: Insufficient documentation

## 2010-11-27 LAB — CBC
MCV: 100.3 fL — ABNORMAL HIGH (ref 78.0–100.0)
Platelets: 78 10*3/uL — ABNORMAL LOW (ref 150–400)
RBC: 3.18 MIL/uL — ABNORMAL LOW (ref 3.87–5.11)
RDW: 15.2 % (ref 11.5–15.5)
WBC: 16.6 10*3/uL — ABNORMAL HIGH (ref 4.0–10.5)

## 2010-11-27 LAB — BASIC METABOLIC PANEL
BUN: 82 mg/dL — ABNORMAL HIGH (ref 6–23)
Chloride: 90 mEq/L — ABNORMAL LOW (ref 96–112)
GFR calc non Af Amer: 17 mL/min — ABNORMAL LOW (ref >60)
Potassium: 5.5 mEq/L — ABNORMAL HIGH (ref 3.5–5.1)
Sodium: 130 mEq/L — ABNORMAL LOW (ref 135–145)

## 2010-11-27 LAB — DIFFERENTIAL
Basophils Absolute: 0 10*3/uL (ref 0.0–0.1)
Eosinophils Absolute: 0 10*3/uL (ref 0.0–0.7)
Eosinophils Relative: 0 % (ref 0–5)
Lymphocytes Relative: 3 % — ABNORMAL LOW (ref 12–46)
Lymphs Abs: 0.5 10*3/uL — ABNORMAL LOW (ref 0.7–4.0)
Neutrophils Relative %: 95 % — ABNORMAL HIGH (ref 43–77)

## 2010-12-01 ENCOUNTER — Other Ambulatory Visit (HOSPITAL_COMMUNITY): Payer: MEDICARE

## 2010-12-01 ENCOUNTER — Ambulatory Visit (HOSPITAL_COMMUNITY): Payer: MEDICARE | Admitting: Oncology

## 2010-12-01 DIAGNOSIS — I890 Lymphedema, not elsewhere classified: Secondary | ICD-10-CM

## 2010-12-01 DIAGNOSIS — C9000 Multiple myeloma not having achieved remission: Secondary | ICD-10-CM

## 2010-12-04 ENCOUNTER — Ambulatory Visit (HOSPITAL_COMMUNITY): Payer: MEDICARE | Admitting: Oncology

## 2010-12-08 ENCOUNTER — Inpatient Hospital Stay (HOSPITAL_COMMUNITY): Payer: MEDICARE

## 2010-12-08 ENCOUNTER — Other Ambulatory Visit (HOSPITAL_COMMUNITY): Payer: MEDICARE

## 2010-12-08 ENCOUNTER — Ambulatory Visit (HOSPITAL_COMMUNITY): Payer: MEDICARE | Admitting: Oncology

## 2010-12-08 ENCOUNTER — Ambulatory Visit (HOSPITAL_COMMUNITY)
Admission: RE | Admit: 2010-12-08 | Discharge: 2010-12-08 | Disposition: A | Discharge status: Home / Self Care | Payer: Medicare Other | Source: Ambulatory Visit | Attending: Oncology | Admitting: Oncology

## 2010-12-08 DIAGNOSIS — I517 Cardiomegaly: Secondary | ICD-10-CM

## 2010-12-08 DIAGNOSIS — C9000 Multiple myeloma not having achieved remission: Secondary | ICD-10-CM | POA: Insufficient documentation

## 2010-12-08 DIAGNOSIS — Z9221 Personal history of antineoplastic chemotherapy: Secondary | ICD-10-CM | POA: Insufficient documentation

## 2010-12-11 ENCOUNTER — Ambulatory Visit (HOSPITAL_COMMUNITY): Payer: Medicare Other | Admitting: Oncology

## 2010-12-11 ENCOUNTER — Other Ambulatory Visit (HOSPITAL_COMMUNITY): Payer: Self-pay | Admitting: Oncology

## 2010-12-11 ENCOUNTER — Other Ambulatory Visit (HOSPITAL_COMMUNITY): Payer: Medicare Other

## 2010-12-11 ENCOUNTER — Inpatient Hospital Stay (HOSPITAL_COMMUNITY): Payer: Medicare Other

## 2010-12-11 ENCOUNTER — Encounter (HOSPITAL_COMMUNITY): Payer: Medicare Other

## 2010-12-11 ENCOUNTER — Inpatient Hospital Stay (HOSPITAL_COMMUNITY): Payer: MEDICARE

## 2010-12-11 DIAGNOSIS — Z5112 Encounter for antineoplastic immunotherapy: Secondary | ICD-10-CM

## 2010-12-11 DIAGNOSIS — N289 Disorder of kidney and ureter, unspecified: Secondary | ICD-10-CM

## 2010-12-11 DIAGNOSIS — D649 Anemia, unspecified: Secondary | ICD-10-CM

## 2010-12-11 DIAGNOSIS — C9000 Multiple myeloma not having achieved remission: Secondary | ICD-10-CM

## 2010-12-11 LAB — DIFFERENTIAL
Lymphocytes Relative: 3 % — ABNORMAL LOW (ref 12–46)
Lymphs Abs: 0.6 10*3/uL — ABNORMAL LOW (ref 0.7–4.0)
Monocytes Relative: 3 % (ref 3–12)
Neutro Abs: 17.6 10*3/uL — ABNORMAL HIGH (ref 1.7–7.7)
Neutrophils Relative %: 94 % — ABNORMAL HIGH (ref 43–77)

## 2010-12-11 LAB — CBC
HCT: 28.9 % — ABNORMAL LOW (ref 36.0–46.0)
Hemoglobin: 10.1 g/dL — ABNORMAL LOW (ref 12.0–15.0)
MCH: 35.1 pg — ABNORMAL HIGH (ref 26.0–34.0)
MCV: 100.3 fL — ABNORMAL HIGH (ref 78.0–100.0)
Platelets: 100 10*3/uL — ABNORMAL LOW (ref 150–400)
RBC: 2.88 MIL/uL — ABNORMAL LOW (ref 3.87–5.11)
WBC: 18.7 10*3/uL — ABNORMAL HIGH (ref 4.0–10.5)

## 2010-12-12 LAB — KAPPA/LAMBDA LIGHT CHAINS
Kappa free light chain: 1.27 mg/dL (ref 0.33–1.94)
Kappa, lambda light chain ratio: 1.15 (ref 0.26–1.65)
Lambda free light chains: 1.1 mg/dL (ref 0.57–2.63)

## 2010-12-13 LAB — PROTEIN ELECTROPHORESIS, SERUM
Albumin ELP: 55.7 % — ABNORMAL LOW (ref 55.8–66.1)
Alpha-1-Globulin: 6.8 % — ABNORMAL HIGH (ref 2.9–4.9)
Beta 2: 5 % (ref 3.2–6.5)
Total Protein ELP: 6 g/dL (ref 6.0–8.3)

## 2010-12-13 LAB — IMMUNOFIXATION ELECTROPHORESIS
IgA: 95 mg/dL (ref 68–378)
IgG (Immunoglobin G), Serum: 462 mg/dL — ABNORMAL LOW (ref 694–1618)
Total Protein ELP: 5.9 g/dL — ABNORMAL LOW (ref 6.0–8.3)

## 2010-12-18 ENCOUNTER — Ambulatory Visit (HOSPITAL_COMMUNITY): Payer: Medicare Other | Admitting: Oncology

## 2010-12-18 ENCOUNTER — Ambulatory Visit (HOSPITAL_COMMUNITY): Payer: Self-pay | Admitting: Oncology

## 2010-12-18 ENCOUNTER — Inpatient Hospital Stay (HOSPITAL_COMMUNITY): Payer: Medicare Other

## 2010-12-18 DIAGNOSIS — C9000 Multiple myeloma not having achieved remission: Secondary | ICD-10-CM

## 2010-12-25 ENCOUNTER — Inpatient Hospital Stay (HOSPITAL_COMMUNITY): Payer: MEDICARE

## 2010-12-26 ENCOUNTER — Other Ambulatory Visit (HOSPITAL_COMMUNITY): Payer: Medicare Other

## 2010-12-26 ENCOUNTER — Inpatient Hospital Stay (HOSPITAL_COMMUNITY)
Admission: EM | Admit: 2010-12-26 | Discharge: 2010-12-29 | DRG: 194 | Disposition: A | Discharge status: Skilled Nursing Facility | Payer: Medicare Other | Attending: Internal Medicine | Admitting: Internal Medicine

## 2010-12-26 ENCOUNTER — Ambulatory Visit (HOSPITAL_COMMUNITY): Payer: Medicare Other | Admitting: Oncology

## 2010-12-26 ENCOUNTER — Emergency Department (HOSPITAL_COMMUNITY): Payer: Medicare Other

## 2010-12-26 DIAGNOSIS — E875 Hyperkalemia: Secondary | ICD-10-CM | POA: Diagnosis present

## 2010-12-26 DIAGNOSIS — R5381 Other malaise: Secondary | ICD-10-CM | POA: Diagnosis present

## 2010-12-26 DIAGNOSIS — N390 Urinary tract infection, site not specified: Secondary | ICD-10-CM | POA: Diagnosis present

## 2010-12-26 DIAGNOSIS — N179 Acute kidney failure, unspecified: Secondary | ICD-10-CM | POA: Diagnosis present

## 2010-12-26 DIAGNOSIS — D630 Anemia in neoplastic disease: Secondary | ICD-10-CM | POA: Diagnosis present

## 2010-12-26 DIAGNOSIS — D6959 Other secondary thrombocytopenia: Secondary | ICD-10-CM | POA: Diagnosis present

## 2010-12-26 DIAGNOSIS — T451X5A Adverse effect of antineoplastic and immunosuppressive drugs, initial encounter: Secondary | ICD-10-CM | POA: Diagnosis present

## 2010-12-26 DIAGNOSIS — G8929 Other chronic pain: Secondary | ICD-10-CM | POA: Diagnosis present

## 2010-12-26 DIAGNOSIS — B370 Candidal stomatitis: Secondary | ICD-10-CM | POA: Diagnosis present

## 2010-12-26 DIAGNOSIS — J189 Pneumonia, unspecified organism: Principal | ICD-10-CM | POA: Diagnosis present

## 2010-12-26 DIAGNOSIS — M545 Low back pain, unspecified: Secondary | ICD-10-CM | POA: Diagnosis present

## 2010-12-26 DIAGNOSIS — C9000 Multiple myeloma not having achieved remission: Secondary | ICD-10-CM | POA: Diagnosis present

## 2010-12-26 DIAGNOSIS — E538 Deficiency of other specified B group vitamins: Secondary | ICD-10-CM | POA: Diagnosis present

## 2010-12-26 DIAGNOSIS — E860 Dehydration: Secondary | ICD-10-CM | POA: Diagnosis present

## 2010-12-26 LAB — INFLUENZA PANEL BY PCR (TYPE A & B)
H1N1 flu by pcr: NOT DETECTED
Influenza A By PCR: NEGATIVE
Influenza B By PCR: NEGATIVE

## 2010-12-26 LAB — URINALYSIS, ROUTINE W REFLEX MICROSCOPIC
Glucose, UA: NEGATIVE mg/dL
Ketones, ur: NEGATIVE mg/dL
Nitrite: POSITIVE — AB
Specific Gravity, Urine: >1.03 — ABNORMAL HIGH (ref 1.005–1.030)
pH: 5 (ref 5.0–8.0)

## 2010-12-26 LAB — CARDIAC PANEL(CRET KIN+CKTOT+MB+TROPI)
Relative Index: INVALID (ref 0.0–2.5)
Total CK: 31 U/L (ref 7–177)
Troponin I: 0.07 ng/mL — ABNORMAL HIGH (ref 0.00–0.06)

## 2010-12-26 LAB — BASIC METABOLIC PANEL
CO2: 20 mEq/L (ref 19–32)
CO2: 22 mEq/L (ref 19–32)
Calcium: 8.7 mg/dL (ref 8.4–10.5)
Calcium: 7.9 mg/dL — ABNORMAL LOW (ref 8.4–10.5)
Creatinine, Ser: 3.16 mg/dL — ABNORMAL HIGH (ref 0.4–1.2)
Creatinine, Ser: 2.66 mg/dL — ABNORMAL HIGH (ref 0.4–1.2)
GFR calc Af Amer: 21 mL/min — ABNORMAL LOW (ref >60)
GFR calc non Af Amer: 14 mL/min — ABNORMAL LOW (ref >60)
GFR calc non Af Amer: 17 mL/min — ABNORMAL LOW (ref >60)
Glucose, Bld: 144 mg/dL — ABNORMAL HIGH (ref 70–99)
Sodium: 132 mEq/L — ABNORMAL LOW (ref 135–145)
Sodium: 137 mEq/L (ref 135–145)

## 2010-12-26 LAB — URINE MICROSCOPIC-ADD ON

## 2010-12-26 LAB — CBC
HCT: 28.7 % — ABNORMAL LOW (ref 36.0–46.0)
MCHC: 34.1 g/dL (ref 30.0–36.0)
MCV: 103.6 fL — ABNORMAL HIGH (ref 78.0–100.0)
Platelets: 63 10*3/uL — ABNORMAL LOW (ref 150–400)
RDW: 15.6 % — ABNORMAL HIGH (ref 11.5–15.5)

## 2010-12-26 LAB — GLUCOSE, CAPILLARY: Glucose-Capillary: 264 mg/dL — ABNORMAL HIGH (ref 70–99)

## 2010-12-26 LAB — DIFFERENTIAL
Basophils Relative: 0 % (ref 0–1)
Eosinophils Absolute: 0 10*3/uL (ref 0.0–0.7)
Eosinophils Relative: 0 % (ref 0–5)
Lymphs Abs: 0.4 10*3/uL — ABNORMAL LOW (ref 0.7–4.0)
Monocytes Relative: 3 % (ref 3–12)
WBC Morphology: INCREASED

## 2010-12-26 LAB — CK TOTAL AND CKMB (NOT AT ARMC): Total CK: 34 U/L (ref 7–177)

## 2010-12-27 DIAGNOSIS — C9000 Multiple myeloma not having achieved remission: Secondary | ICD-10-CM

## 2010-12-27 LAB — HEPATIC FUNCTION PANEL
ALT: 15 U/L (ref 0–35)
Albumin: 2 g/dL — ABNORMAL LOW (ref 3.5–5.2)
Indirect Bilirubin: 0.3 mg/dL (ref 0.3–0.9)

## 2010-12-27 LAB — DIFFERENTIAL
Basophils Absolute: 0 10*3/uL (ref 0.0–0.1)
Basophils Relative: 0 % (ref 0–1)
Eosinophils Absolute: 0 10*3/uL (ref 0.0–0.7)
Eosinophils Relative: 0 % (ref 0–5)
Monocytes Absolute: 0.1 10*3/uL (ref 0.1–1.0)
Monocytes Relative: 1 % — ABNORMAL LOW (ref 3–12)
Neutro Abs: 5 10*3/uL (ref 1.7–7.7)

## 2010-12-27 LAB — VITAMIN B12: Vitamin B-12: 1280 pg/mL — ABNORMAL HIGH (ref 211–911)

## 2010-12-27 LAB — BASIC METABOLIC PANEL
CO2: 23 mEq/L (ref 19–32)
Calcium: 8 mg/dL — ABNORMAL LOW (ref 8.4–10.5)
Creatinine, Ser: 2.16 mg/dL — ABNORMAL HIGH (ref 0.4–1.2)
GFR calc Af Amer: 27 mL/min — ABNORMAL LOW (ref >60)
GFR calc non Af Amer: 22 mL/min — ABNORMAL LOW (ref >60)
Sodium: 139 mEq/L (ref 135–145)

## 2010-12-27 LAB — CARDIAC PANEL(CRET KIN+CKTOT+MB+TROPI): Relative Index: INVALID (ref 0.0–2.5)

## 2010-12-27 LAB — PHOSPHORUS: Phosphorus: 5 mg/dL — ABNORMAL HIGH (ref 2.3–4.6)

## 2010-12-27 LAB — CBC
Hemoglobin: 7.2 g/dL — ABNORMAL LOW (ref 12.0–15.0)
MCH: 35.3 pg — ABNORMAL HIGH (ref 26.0–34.0)
MCHC: 33.8 g/dL (ref 30.0–36.0)
RDW: 15.6 % — ABNORMAL HIGH (ref 11.5–15.5)

## 2010-12-27 LAB — LEGIONELLA ANTIGEN, URINE

## 2010-12-27 LAB — TSH: TSH: 0.424 u[IU]/mL (ref 0.350–4.500)

## 2010-12-27 LAB — URINE CULTURE

## 2010-12-27 LAB — MAGNESIUM: Magnesium: 1.7 mg/dL (ref 1.5–2.5)

## 2010-12-28 DIAGNOSIS — C9000 Multiple myeloma not having achieved remission: Secondary | ICD-10-CM

## 2010-12-28 LAB — CULTURE, RESPIRATORY W GRAM STAIN: Gram Stain: NONE SEEN

## 2010-12-28 LAB — CBC
Hemoglobin: 7.1 g/dL — ABNORMAL LOW (ref 12.0–15.0)
MCHC: 33.6 g/dL (ref 30.0–36.0)
RBC: 2.04 MIL/uL — ABNORMAL LOW (ref 3.87–5.11)
WBC: 6.8 10*3/uL (ref 4.0–10.5)

## 2010-12-28 LAB — DIFFERENTIAL
Basophils Absolute: 0 10*3/uL (ref 0.0–0.1)
Basophils Relative: 0 % (ref 0–1)
Monocytes Absolute: 0.3 10*3/uL (ref 0.1–1.0)
Neutro Abs: 6.3 10*3/uL (ref 1.7–7.7)
Neutrophils Relative %: 92 % — ABNORMAL HIGH (ref 43–77)

## 2010-12-28 LAB — BASIC METABOLIC PANEL
CO2: 22 mEq/L (ref 19–32)
Calcium: 7.7 mg/dL — ABNORMAL LOW (ref 8.4–10.5)
GFR calc Af Amer: 34 mL/min — ABNORMAL LOW (ref >60)
Potassium: 3.5 mEq/L (ref 3.5–5.1)
Sodium: 139 mEq/L (ref 135–145)

## 2010-12-29 ENCOUNTER — Ambulatory Visit (HOSPITAL_COMMUNITY): Payer: MEDICARE | Admitting: Oncology

## 2010-12-29 ENCOUNTER — Inpatient Hospital Stay
Admission: RE | Admit: 2010-12-29 | Discharge: 2011-02-01 | Disposition: A | Discharge status: Home / Self Care | Payer: Medicare Other | Source: Ambulatory Visit | Attending: Internal Medicine | Admitting: Internal Medicine

## 2010-12-29 DIAGNOSIS — C9000 Multiple myeloma not having achieved remission: Secondary | ICD-10-CM

## 2010-12-29 LAB — BASIC METABOLIC PANEL
GFR calc non Af Amer: 36 mL/min — ABNORMAL LOW (ref >60)
Potassium: 3.7 mEq/L (ref 3.5–5.1)
Sodium: 138 mEq/L (ref 135–145)

## 2010-12-29 LAB — DIFFERENTIAL
Basophils Absolute: 0 10*3/uL (ref 0.0–0.1)
Basophils Relative: 0 % (ref 0–1)
Monocytes Relative: 5 % (ref 3–12)
Neutro Abs: 7.2 10*3/uL (ref 1.7–7.7)
Neutrophils Relative %: 93 % — ABNORMAL HIGH (ref 43–77)

## 2010-12-29 LAB — CBC
Platelets: 46 10*3/uL — ABNORMAL LOW (ref 150–400)
RDW: 16.4 % — ABNORMAL HIGH (ref 11.5–15.5)
WBC: 7.7 10*3/uL (ref 4.0–10.5)

## 2010-12-29 LAB — FERRITIN: Ferritin: 1058 ng/mL — ABNORMAL HIGH (ref 10–291)

## 2010-12-30 LAB — CROSSMATCH
ABO/RH(D): A POS
Antibody Screen: NEGATIVE
Unit division: 0

## 2011-01-01 ENCOUNTER — Ambulatory Visit (HOSPITAL_COMMUNITY): Payer: Self-pay

## 2011-01-01 ENCOUNTER — Inpatient Hospital Stay (HOSPITAL_COMMUNITY): Payer: MEDICARE

## 2011-01-01 LAB — COMPREHENSIVE METABOLIC PANEL
ALT: 14 U/L (ref 0–35)
ALT: 16 U/L (ref 0–35)
AST: 15 U/L (ref 0–37)
Albumin: 4 g/dL (ref 3.5–5.2)
Alkaline Phosphatase: 45 U/L (ref 39–117)
CO2: 24 mEq/L (ref 19–32)
Calcium: 10.2 mg/dL (ref 8.4–10.5)
Calcium: 11 mg/dL — ABNORMAL HIGH (ref 8.4–10.5)
Chloride: 99 mEq/L (ref 96–112)
GFR calc Af Amer: 49 mL/min — ABNORMAL LOW (ref >60)
GFR calc Af Amer: 54 mL/min — ABNORMAL LOW (ref >60)
GFR calc non Af Amer: 40 mL/min — ABNORMAL LOW (ref >60)
Potassium: 4.1 mEq/L (ref 3.5–5.1)
Sodium: 133 mEq/L — ABNORMAL LOW (ref 135–145)
Sodium: 135 mEq/L (ref 135–145)
Total Bilirubin: 0.5 mg/dL (ref 0.3–1.2)
Total Protein: 6.5 g/dL (ref 6.0–8.3)

## 2011-01-01 LAB — DIFFERENTIAL
Basophils Relative: 0 % (ref 0–1)
Eosinophils Absolute: 0 10*3/uL (ref 0.0–0.7)
Eosinophils Absolute: 0 10*3/uL (ref 0.0–0.7)
Eosinophils Relative: 0 % (ref 0–5)
Lymphs Abs: 1 10*3/uL (ref 0.7–4.0)
Lymphs Abs: 1.2 10*3/uL (ref 0.7–4.0)
Monocytes Absolute: 0.5 10*3/uL (ref 0.1–1.0)
Monocytes Absolute: 0.6 10*3/uL (ref 0.1–1.0)
Monocytes Relative: 7 % (ref 3–12)

## 2011-01-01 LAB — CBC
Hemoglobin: 10.1 g/dL — ABNORMAL LOW (ref 12.0–15.0)
MCHC: 34.7 g/dL (ref 30.0–36.0)
Platelets: 166 10*3/uL (ref 150–400)
RBC: 2.91 MIL/uL — ABNORMAL LOW (ref 3.87–5.11)
RDW: 16.2 % — ABNORMAL HIGH (ref 11.5–15.5)
WBC: 7.1 10*3/uL (ref 4.0–10.5)

## 2011-01-01 LAB — PROTEIN ELECTROPHORESIS, SERUM
Albumin ELP: 60.7 % (ref 55.8–66.1)
Alpha-1-Globulin: 6.4 % — ABNORMAL HIGH (ref 2.9–4.9)
Alpha-2-Globulin: 16.2 % — ABNORMAL HIGH (ref 7.1–11.8)
Gamma Globulin: 5.7 % — ABNORMAL LOW (ref 11.1–18.8)

## 2011-01-01 LAB — IGG, IGA, IGM
IgA: 100 mg/dL (ref 68–378)
IgG (Immunoglobin G), Serum: 441 mg/dL — ABNORMAL LOW (ref 694–1618)
IgM, Serum: 51 mg/dL — ABNORMAL LOW (ref 60–263)

## 2011-01-01 LAB — PROTEIN ELECTROPH W RFLX QUANT IMMUNOGLOBULINS
Alpha-2-Globulin: 15.4 % — ABNORMAL HIGH (ref 7.1–11.8)
Beta Globulin: 6.1 % (ref 4.7–7.2)
Gamma Globulin: 6.8 % — ABNORMAL LOW (ref 11.1–18.8)
M-Spike, %: NOT DETECTED g/dL
Total Protein ELP: 6.4 g/dL (ref 6.0–8.3)

## 2011-01-01 LAB — IMMUNOFIXATION ELECTROPHORESIS
IgA: 209 mg/dL (ref 68–378)
IgM, Serum: 51 mg/dL — ABNORMAL LOW (ref 60–263)
IgM, Serum: 54 mg/dL — ABNORMAL LOW (ref 60–263)
Total Protein ELP: 6.4 g/dL (ref 6.0–8.3)

## 2011-01-01 LAB — FOLATE RBC: RBC Folate: 1801 ng/mL — ABNORMAL HIGH (ref 180–600)

## 2011-01-01 LAB — KAPPA/LAMBDA LIGHT CHAINS
Kappa free light chain: 0.78 mg/dL (ref 0.33–1.94)
Kappa, lambda light chain ratio: 0.33 (ref 0.26–1.65)

## 2011-01-01 NOTE — H&P (Signed)
NAME:  [name], [name]                ACCOUNT NO.:  [id number]  MEDICAL RECORD NO.:  [id number]           PATIENT TYPE:  I  LOCATION:  A334                          FACILITY:  [facility]  PHYSICIAN:  Elliot Cousin, M.D.    DATE OF BIRTH:  01/17/1933  DATE OF ADMISSION:  12/26/2010 DATE OF DISCHARGE:  LH                             HISTORY & PHYSICAL   PRIMARY CARE PHYSICIAN:  [name], MD  PRIMARY ONCOLOGIST:  Ladona Horns. Neijstrom, MD  CHIEF COMPLAINTS:  Productive cough, shortness of breath, and generalized weakness.  HISTORY OF PRESENT ILLNESS:  The patient is a 77 year old woman with a past medical history significant for multiple myeloma, diagnosed in November 2007, gastroesophageal reflux disease, bilateral lower extremity edema, and anemia, who presented to the emergency department this morning with a chief complaint of a productive cough, shortness of breath, and generalized weakness.  The history is being provided by the patient and her son, Mr. Keena Dinse, (phone number [phone]). Accordingly, over the past week, the patient has developed a productive cough with yellow and white-colored sputum.  She has had an increase in chest congestion.  She has had shortness of breath at rest and with activity.  She has become progressively weak generally.  She has become so weak, that she has had multiple falls over the past week.  During one of the falls, she did bump her head on a wall at home; however, there was no apparent bruises or cuts or loss of consciousness.  She has scrapped her legs a few times.  Her appetite has been poor.  She has had no subjective fever or chills.  She may have had pleurisy, she is not sure.  She has had nausea but no vomiting.  She has no complaints of abdominal pain.  She has had a few loose black tarry stools.  No bright red blood per rectum.  She denies pain with urination.  In the emergency department, the patient is afebrile, blood pressure  is 118/56, pulse rate is 114, and respiratory rate is 29. She is oxygenating 99% on oxygen supplementation.  Her EKG reveals sinus tachycardia with PAC, incomplete right bundle-branch block, left anterior fascicular block, and a heart rate of 113 beats per minute. Her chest x-ray reveals pneumonia involving the right lung base, stable cardiomegaly, and no pulmonary edema.  Her most significant lab data reveal a serum potassium of 6.6, BUN of 64, creatinine of 3.16, WBC of 12.1, hemoglobin of 9.8, platelet count of 63,000, and a urinalysis that has positive nitrites and leukocytes.  She is being admitted for further evaluation and management.  PAST MEDICAL HISTORY: 1. IgA lambda multiple myeloma, diagnosed in November 2007 following     biopsy of a right scalp mass.  The patient has undergone treatment     with several chemotherapeutic agents under the guidance of Dr.     Mariel Sleet.  Recently, all chemotherapy had to be discontinued due     to toxicities that led to severe peripheral neuropathy. 2. Bilateral lower extremity edema, recently treated with     spironolactone and Lasix. 3.  Ejection fraction of 50% to 55%, akinesis of the basal mid     inferolateral myocardium, moderately dilated left atrium, and     moderately increased right ventricular wall thickness, per 2-D     echocardiogram on December 08, 2010. 4. Morbid obesity. 5. History of vitamin B12 deficiency. 6. Status post Port-A-Cath insertion on November 15, 2009, by Dr.     Lovell Sheehan with a redo on January 12, 2010, by Dr. Lovell Sheehan. 7. History of thrombocytopenia and anemia secondary to multiple     myeloma and chemotherapy. 8. Chronic low back pain.  MEDICATIONS: 1. Prednisone 5 mg daily. 2. Methadone 5 mg half a tablet twice daily. 3. Zovirax 200 mg twice daily. 4. Omeprazole 20 mg daily. 5. Amlodipine 2.5 mg daily. 6. Iron dextran complex 150 mg daily. 7. Spirolactone 50 mg daily, decreased from 50 mg twice daily. 8.  Vitamin E 1000 International Units daily. 9. Hydrocodone/APAP 10/325 mg every 6 hours as needed for breakthrough     pain. 10.Vitamin D3 2000 International Units b.i.d. 11.Potassium chloride 20 mEq daily. 12.Lorazepam 1 mg 3 times daily as needed. 13.Furosemide 20 mg daily, decreased from 40 mg daily. 14.Folic acid 1 mg daily. 15.Diazepam 5 mg 1 tablet every 8 hours as needed. 16.Calcium 600 mg daily.  ALLERGIES:  The patient has an allergy to Revlimid which causes a rash.  SOCIAL HISTORY:  The patient is widowed.  She lives alone in [place].  Her son, Dorene Sorrow, lives next door and checks on her at least twice daily.  The patient has two children in all.  She has no history of tobacco, alcohol, or illicit drug use.  She does not drive.  She had been ambulating with a walker; however, she has become too weak to ambulate.  She now needs help with all of her ADLs.  FAMILY HISTORY:  Both of her parents are deceased.  She does not recall what her father died of.  Her mother may have died of complications from either asthma or COPD.  REVIEW OF SYSTEMS:  As above in the history present illness.  Otherwise, review of systems is negative.  PHYSICAL EXAMINATION:  VITAL SIGNS:  Temperature 98.5, blood pressure 105/63, pulse 101, respiratory rate 22, oxygen saturation 96% on oxygen supplementation. GENERAL:  The patient is a semi alert obese 77 year old Caucasian woman who is currently lying in bed in no acute distress.  She does appear ill. HEENT: There is a scar-like dimple on the right frontal scalp from the previous biopsy site.  Otherwise, head is normocephalic and nontraumatic.  Pupils are equal, round, reactive to light.  Extraocular muscles are intact.  Conjunctivae are clear, and sclerae are white. Tympanic membranes not examined.  Nasal mucosa is moist.  No sinus tenderness.  Oropharynx reveals mildly dry mucous membranes.  There is a white exudate on her tongue, posterior  pharynx, and buccal mucosa which may be from thrush or mucous.  No posterior erythema. NECK:  Supple and obese.  Mild thyromegaly.  Otherwise, no masses palpated, no JVD, and no bruit. LUNGS/RESPIRATORY:  The patient's breathing is nonlabored.  She experiences several coughs with yellow sputum.  There are a few wheezes auscultated bilaterally and crackles auscultated primarily on the right. HEART:  Distant S1 and S2.  Chest wall Port-A-Cath present at the right upper chest. ABDOMEN:  Positive bowel sounds, morbidly obese, soft, nontender, nondistended.  No hepatosplenomegaly.  No masses palpated. GU:  There is an indwelling Foley catheter draining dark yellow urine.  The Foley catheter was placed by the registered nurse in the emergency department. RECTAL:  Deferred. EXTREMITIES:  There are a few excoriated lesions on the pretibial surface of her right leg, nonbleeding.  Pedal pulses are palpable bilaterally.  Just a trace of pedal edema bilaterally.  There are no acute hot red joints.  The patient is able to flex and extend her lower extremities bilaterally. NEUROLOGIC:  The patient is semi alert; however, when she is alert, her speech is clear and she is oriented to place and family.  Cranial nerves II through XII appear to be grossly intact.  She has a mildly weak handgrip bilaterally.  She is able to raise both of her legs off the bed against gravity, approximately 30 degrees.  ADMISSION LABORATORIES:  EKG and chest x-ray results were dictated above.  WBC 12.1, hemoglobin 9.8, platelet count 63,000.  PT 14.6, INR 1.12, PTT 32.  Urinalysis greater than 1.030 specific gravity, small bilirubin, trace protein, positive nitrite, small leukocytes.  Micro urine, many squamous cells, 21-50 wbc's, 3-6 rbc's, and many bacteria.  Sodium 132, potassium 6.6, chloride 101, CO2 20, glucose 144, BUN 64, creatinine 3.16, calcium 8.7.  Repeat potassium 6.4.  ASSESSMENT: 1. Community-acquired  right lower lobe pneumonia in a patient with     multiple myeloma and relative immunosuppression from recent     chemotherapy. 2. Urinary tract infection. 3. Acute renal failure in the setting of recent treatment for lower     extremity edema with spironolactone and Lasix.  The patient's BUN     was 39 and her creatinine was 1.29 on October 19, 2010.  Her BUN     increased to 82 and her creatinine increased to 2.1 following     diuretic therapy on November 27, 2010.  Today, her BUN is 64 and her     creatinine is 3.16.  In part, the patient appears to be volume     depleted or dehydrated which has probably led to prerenal azotemia.     Also, myeloma nephropathy is a consideration. 4. Hyperkalemia.  The patient's serum potassium is 6.6.  The     hyperkalemia is secondary to not only acute renal failure but also     to spironolactone and potassium chloride supplementation. 5. Abnormal EKG as dictated above. 6. Oral thrush. 7. Thrombocytopenia which has been chronic, likely chemotherapy     induced. 8. Chronic anemia, secondary to chronic disease and multiple myeloma.  PLAN: 1. The patient was given Rocephin in the emergency department.  We     will continue antibiotic therapy with Cefepime and azithromycin. 2. We will start albuterol nebulization, oxygen therapy, and     supportive treatment. 3. We will give the patient Kayexalate, dextrose, insulin, calcium     carbonate, and Lasix to treat her hyperkalemia. 4. We will change her IV fluids to half normal saline with bicarbonate     added in an attempt to further decrease her serum potassium. 5. We will monitor her renal function and serum potassium closely.  We     will start Diflucan for treatment of oral thrush. 6. We will order a sputum culture, urine culture, influenza PCR, and     urine Legionella antigen. 7. We will consult Dr. Mariel Sleet as needed. 8. I discussed code status with the patient and her son, and per our      conversation, she is a full code.     Elliot Cousin, M.D.     DF/MEDQ  D:  12/26/2010  T:  12/26/2010  Job:  [id number]  cc:   [name], M.D. Fax: [phone]  Ladona Horns. Mariel Sleet, MD Fax: [phone]  Electronically Signed by Elliot Cousin M.D. on 01/01/2011 09:19:54 PM

## 2011-01-02 LAB — DIFFERENTIAL
Basophils Absolute: 0 10*3/uL (ref 0.0–0.1)
Basophils Relative: 0 % (ref 0–1)
Eosinophils Absolute: 0 10*3/uL (ref 0.0–0.7)
Eosinophils Relative: 0 % (ref 0–5)
Eosinophils Relative: 0 % (ref 0–5)
Eosinophils Relative: 0 % (ref 0–5)
Lymphocytes Relative: 3 % — ABNORMAL LOW (ref 12–46)
Lymphocytes Relative: 3 % — ABNORMAL LOW (ref 12–46)
Lymphs Abs: 0.1 10*3/uL — ABNORMAL LOW (ref 0.7–4.0)
Lymphs Abs: 0.3 10*3/uL — ABNORMAL LOW (ref 0.7–4.0)
Monocytes Absolute: 0.1 10*3/uL (ref 0.1–1.0)
Monocytes Relative: 12 % (ref 3–12)
Monocytes Relative: 3 % (ref 3–12)
Neutro Abs: 4.1 10*3/uL (ref 1.7–7.7)
Neutro Abs: 7.2 10*3/uL (ref 1.7–7.7)
Neutrophils Relative %: 83 % — ABNORMAL HIGH (ref 43–77)
Neutrophils Relative %: 88 % — ABNORMAL HIGH (ref 43–77)

## 2011-01-02 LAB — CROSSMATCH
ABO/RH(D): A POS
Antibody Screen: NEGATIVE

## 2011-01-02 LAB — IGG, IGA, IGM: IgA: 91 mg/dL (ref 68–378)

## 2011-01-02 LAB — CBC
HCT: 30.9 % — ABNORMAL LOW (ref 36.0–46.0)
Hemoglobin: 10.4 g/dL — ABNORMAL LOW (ref 12.0–15.0)
Hemoglobin: 6.8 g/dL — CL (ref 12.0–15.0)
Hemoglobin: 8.8 g/dL — ABNORMAL LOW (ref 12.0–15.0)
MCH: 33.8 pg (ref 26.0–34.0)
MCH: 35.8 pg — ABNORMAL HIGH (ref 26.0–34.0)
MCHC: 33.2 g/dL (ref 30.0–36.0)
MCHC: 33.4 g/dL (ref 30.0–36.0)
MCV: 101.9 fL — ABNORMAL HIGH (ref 78.0–100.0)
MCV: 102 fL — ABNORMAL HIGH (ref 78.0–100.0)
MCV: 107.3 fL — ABNORMAL HIGH (ref 78.0–100.0)
Platelets: 184 10*3/uL (ref 150–400)
RBC: 1.9 MIL/uL — ABNORMAL LOW (ref 3.87–5.11)
RBC: 3.03 MIL/uL — ABNORMAL LOW (ref 3.87–5.11)
RDW: 18.4 % — ABNORMAL HIGH (ref 11.5–15.5)
WBC: 4.4 10*3/uL (ref 4.0–10.5)

## 2011-01-02 LAB — PROTEIN ELECTROPH W RFLX QUANT IMMUNOGLOBULINS
Albumin ELP: 58.9 % (ref 55.8–66.1)
Alpha-1-Globulin: 8.3 % — ABNORMAL HIGH (ref 2.9–4.9)
Alpha-2-Globulin: 14.6 % — ABNORMAL HIGH (ref 7.1–11.8)
Beta Globulin: 7.2 % (ref 4.7–7.2)
Total Protein ELP: 5.6 g/dL — ABNORMAL LOW (ref 6.0–8.3)

## 2011-01-02 LAB — IMMUNOFIXATION ELECTROPHORESIS: IgM, Serum: 32 mg/dL — ABNORMAL LOW (ref 60–263)

## 2011-01-02 LAB — KAPPA/LAMBDA LIGHT CHAINS
Kappa free light chain: 0.75 mg/dL (ref 0.33–1.94)
Kappa, lambda light chain ratio: 0.28 (ref 0.26–1.65)
Lambda free light chains: 2.64 mg/dL — ABNORMAL HIGH (ref 0.57–2.63)

## 2011-01-02 LAB — COMPREHENSIVE METABOLIC PANEL
ALT: 13 U/L (ref 0–35)
BUN: 30 mg/dL — ABNORMAL HIGH (ref 6–23)
CO2: 23 mEq/L (ref 19–32)
Calcium: 8.7 mg/dL (ref 8.4–10.5)
Calcium: 9.1 mg/dL (ref 8.4–10.5)
Chloride: 104 mEq/L (ref 96–112)
Creatinine, Ser: 1.09 mg/dL (ref 0.4–1.2)
Creatinine, Ser: 1.35 mg/dL — ABNORMAL HIGH (ref 0.4–1.2)
GFR calc Af Amer: 46 mL/min — ABNORMAL LOW (ref >60)
GFR calc non Af Amer: 38 mL/min — ABNORMAL LOW (ref >60)
GFR calc non Af Amer: 49 mL/min — ABNORMAL LOW (ref >60)
Glucose, Bld: 104 mg/dL — ABNORMAL HIGH (ref 70–99)
Glucose, Bld: 94 mg/dL (ref 70–99)
Sodium: 138 mEq/L (ref 135–145)
Total Bilirubin: 0.9 mg/dL (ref 0.3–1.2)
Total Protein: 6.2 g/dL (ref 6.0–8.3)

## 2011-01-03 ENCOUNTER — Inpatient Hospital Stay (HOSPITAL_COMMUNITY): Payer: Medicare Other | Attending: Emergency Medicine

## 2011-01-03 ENCOUNTER — Ambulatory Visit (HOSPITAL_COMMUNITY)
Admission: EM | Admit: 2011-01-03 | Discharge: 2011-01-03 | Disposition: A | Discharge status: Home / Self Care | Payer: Medicare Other | Source: Ambulatory Visit | Attending: Emergency Medicine | Admitting: Emergency Medicine

## 2011-01-03 DIAGNOSIS — IMO0002 Reserved for concepts with insufficient information to code with codable children: Secondary | ICD-10-CM | POA: Insufficient documentation

## 2011-01-03 DIAGNOSIS — D649 Anemia, unspecified: Secondary | ICD-10-CM | POA: Insufficient documentation

## 2011-01-03 DIAGNOSIS — R609 Edema, unspecified: Secondary | ICD-10-CM | POA: Insufficient documentation

## 2011-01-03 DIAGNOSIS — D696 Thrombocytopenia, unspecified: Secondary | ICD-10-CM | POA: Insufficient documentation

## 2011-01-03 DIAGNOSIS — I498 Other specified cardiac arrhythmias: Secondary | ICD-10-CM | POA: Insufficient documentation

## 2011-01-03 DIAGNOSIS — Z87898 Personal history of other specified conditions: Secondary | ICD-10-CM | POA: Insufficient documentation

## 2011-01-03 DIAGNOSIS — Z79899 Other long term (current) drug therapy: Secondary | ICD-10-CM | POA: Insufficient documentation

## 2011-01-03 LAB — BASIC METABOLIC PANEL
Chloride: 107 mEq/L (ref 96–112)
GFR calc non Af Amer: 43 mL/min — ABNORMAL LOW (ref >60)
Potassium: 3.7 mEq/L (ref 3.5–5.1)
Sodium: 140 mEq/L (ref 135–145)

## 2011-01-03 LAB — DIFFERENTIAL
Basophils Absolute: 0 10*3/uL (ref 0.0–0.1)
Eosinophils Absolute: 0 10*3/uL (ref 0.0–0.7)
Eosinophils Absolute: 0 10*3/uL (ref 0.0–0.7)
Eosinophils Relative: 1 % (ref 0–5)
Lymphocytes Relative: 6 % — ABNORMAL LOW (ref 12–46)
Lymphocytes Relative: 4 % — ABNORMAL LOW (ref 12–46)
Lymphs Abs: 0.3 10*3/uL — ABNORMAL LOW (ref 0.7–4.0)
Monocytes Relative: 3 % (ref 3–12)
Monocytes Relative: 6 % (ref 3–12)
Neutrophils Relative %: 90 % — ABNORMAL HIGH (ref 43–77)

## 2011-01-03 LAB — SAMPLE TO BLOOD BANK

## 2011-01-03 LAB — CBC
HCT: 22.8 % — ABNORMAL LOW (ref 36.0–46.0)
HCT: 23.3 % — ABNORMAL LOW (ref 36.0–46.0)
HCT: 25.6 % — ABNORMAL LOW (ref 36.0–46.0)
Hemoglobin: 7.9 g/dL — ABNORMAL LOW (ref 12.0–15.0)
Hemoglobin: 7.9 g/dL — ABNORMAL LOW (ref 12.0–15.0)
Hemoglobin: 8.5 g/dL — ABNORMAL LOW (ref 12.0–15.0)
MCH: 35.6 pg — ABNORMAL HIGH (ref 26.0–34.0)
MCH: 36 pg — ABNORMAL HIGH (ref 26.0–34.0)
MCHC: 34.8 g/dL (ref 30.0–36.0)
MCV: 104.1 fL — ABNORMAL HIGH (ref 78.0–100.0)
Platelets: 150 10*3/uL (ref 150–400)
RBC: 2.21 MIL/uL — ABNORMAL LOW (ref 3.87–5.11)
RBC: 2.23 MIL/uL — ABNORMAL LOW (ref 3.87–5.11)
RBC: 2.44 MIL/uL — ABNORMAL LOW (ref 3.87–5.11)
WBC: 4.5 10*3/uL (ref 4.0–10.5)
WBC: 7.7 10*3/uL (ref 4.0–10.5)

## 2011-01-03 LAB — POCT CARDIAC MARKERS
CKMB, poc: 2.2 ng/mL (ref 1.0–8.0)
Myoglobin, poc: 109 ng/mL (ref 12–200)
Troponin i, poc: <0.05 ng/mL (ref 0.00–0.09)

## 2011-01-04 LAB — KAPPA/LAMBDA LIGHT CHAINS
Kappa free light chain: 0.8 mg/dL (ref 0.33–1.94)
Kappa, lambda light chain ratio: 0.19 — ABNORMAL LOW (ref 0.26–1.65)
Lambda free light chains: 2.57 mg/dL (ref 0.57–2.63)
Lambda free light chains: 4.75 mg/dL — ABNORMAL HIGH (ref 0.57–2.63)

## 2011-01-04 LAB — COMPREHENSIVE METABOLIC PANEL
ALT: 13 U/L (ref 0–35)
AST: 12 U/L (ref 0–37)
AST: 19 U/L (ref 0–37)
Albumin: 3.3 g/dL — ABNORMAL LOW (ref 3.5–5.2)
Albumin: 3.5 g/dL (ref 3.5–5.2)
Alkaline Phosphatase: 35 U/L — ABNORMAL LOW (ref 39–117)
Alkaline Phosphatase: 42 U/L (ref 39–117)
Alkaline Phosphatase: 46 U/L (ref 39–117)
BUN: 19 mg/dL (ref 6–23)
BUN: 22 mg/dL (ref 6–23)
CO2: 20 mEq/L (ref 19–32)
Calcium: 9.5 mg/dL (ref 8.4–10.5)
Chloride: 113 mEq/L — ABNORMAL HIGH (ref 96–112)
Chloride: 108 mEq/L (ref 96–112)
Creatinine, Ser: 1.11 mg/dL (ref 0.4–1.2)
GFR calc Af Amer: >60 mL/min (ref >60)
GFR calc Af Amer: >60 mL/min (ref >60)
GFR calc non Af Amer: 51 mL/min — ABNORMAL LOW (ref >60)
Glucose, Bld: 88 mg/dL (ref 70–99)
Glucose, Bld: 158 mg/dL — ABNORMAL HIGH (ref 70–99)
Potassium: 3.9 mEq/L (ref 3.5–5.1)
Potassium: 4.3 mEq/L (ref 3.5–5.1)
Sodium: 138 mEq/L (ref 135–145)
Sodium: 138 mEq/L (ref 135–145)
Total Bilirubin: 0.3 mg/dL (ref 0.3–1.2)
Total Bilirubin: 0.7 mg/dL (ref 0.3–1.2)
Total Protein: 6.2 g/dL (ref 6.0–8.3)

## 2011-01-04 LAB — IMMUNOFIXATION ADD-ON

## 2011-01-04 LAB — DIFFERENTIAL
Basophils Absolute: 0 10*3/uL (ref 0.0–0.1)
Basophils Relative: 0 % (ref 0–1)
Basophils Relative: 0 % (ref 0–1)
Basophils Relative: 0 % (ref 0–1)
Eosinophils Absolute: 0 10*3/uL (ref 0.0–0.7)
Eosinophils Absolute: 0 10*3/uL (ref 0.0–0.7)
Eosinophils Relative: 0 % (ref 0–5)
Eosinophils Relative: 0 % (ref 0–5)
Eosinophils Relative: 1 % (ref 0–5)
Eosinophils Relative: 2 % (ref 0–5)
Lymphocytes Relative: 15 % (ref 12–46)
Lymphocytes Relative: 2 % — ABNORMAL LOW (ref 12–46)
Lymphocytes Relative: 4 % — ABNORMAL LOW (ref 12–46)
Lymphocytes Relative: 6 % — ABNORMAL LOW (ref 12–46)
Lymphs Abs: 0.1 10*3/uL — ABNORMAL LOW (ref 0.7–4.0)
Lymphs Abs: 0.3 10*3/uL — ABNORMAL LOW (ref 0.7–4.0)
Lymphs Abs: 0.3 10*3/uL — ABNORMAL LOW (ref 0.7–4.0)
Lymphs Abs: 0.5 10*3/uL — ABNORMAL LOW (ref 0.7–4.0)
Monocytes Absolute: 0.3 10*3/uL (ref 0.1–1.0)
Monocytes Absolute: 0.3 10*3/uL (ref 0.1–1.0)
Monocytes Absolute: 0.6 10*3/uL (ref 0.1–1.0)
Monocytes Relative: 1 % — ABNORMAL LOW (ref 3–12)
Monocytes Relative: 11 % (ref 3–12)
Monocytes Relative: 4 % (ref 3–12)
Neutro Abs: 1.4 10*3/uL — ABNORMAL LOW (ref 1.7–7.7)
Neutro Abs: 4.4 10*3/uL (ref 1.7–7.7)
Neutro Abs: 5 10*3/uL (ref 1.7–7.7)
Neutrophils Relative %: 65 % (ref 43–77)
Neutrophils Relative %: 95 % — ABNORMAL HIGH (ref 43–77)

## 2011-01-04 LAB — PROTEIN ELECTROPH W RFLX QUANT IMMUNOGLOBULINS
Albumin ELP: 58.5 % (ref 55.8–66.1)
Alpha-1-Globulin: 4.8 % (ref 2.9–4.9)
Alpha-2-Globulin: 11.2 % (ref 7.1–11.8)
Beta 2: 5.7 % (ref 3.2–6.5)

## 2011-01-04 LAB — CBC
HCT: 28.5 % — ABNORMAL LOW (ref 36.0–46.0)
HCT: 30.9 % — ABNORMAL LOW (ref 36.0–46.0)
HCT: 33.5 % — ABNORMAL LOW (ref 36.0–46.0)
Hemoglobin: 10.1 g/dL — ABNORMAL LOW (ref 12.0–15.0)
Hemoglobin: 10.7 g/dL — ABNORMAL LOW (ref 12.0–15.0)
Hemoglobin: 11.3 g/dL — ABNORMAL LOW (ref 12.0–15.0)
MCH: 34.5 pg — ABNORMAL HIGH (ref 26.0–34.0)
MCH: 35 pg — ABNORMAL HIGH (ref 26.0–34.0)
MCHC: 33.7 g/dL (ref 30.0–36.0)
MCV: 102.4 fL — ABNORMAL HIGH (ref 78.0–100.0)
MCV: 102.5 fL — ABNORMAL HIGH (ref 78.0–100.0)
MCV: 102.8 fL — ABNORMAL HIGH (ref 78.0–100.0)
MCV: 102.8 fL — ABNORMAL HIGH (ref 78.0–100.0)
MCV: 103.1 fL — ABNORMAL HIGH (ref 78.0–100.0)
Platelets: 130 10*3/uL — ABNORMAL LOW (ref 150–400)
Platelets: 150 10*3/uL (ref 150–400)
Platelets: 159 10*3/uL (ref 150–400)
RBC: 2.59 MIL/uL — ABNORMAL LOW (ref 3.87–5.11)
RBC: 2.78 MIL/uL — ABNORMAL LOW (ref 3.87–5.11)
RBC: 2.91 MIL/uL — ABNORMAL LOW (ref 3.87–5.11)
RBC: 3 MIL/uL — ABNORMAL LOW (ref 3.87–5.11)
RBC: 3.22 MIL/uL — ABNORMAL LOW (ref 3.87–5.11)
RDW: 14.8 % (ref 11.5–15.5)
RDW: 15.2 % (ref 11.5–15.5)
RDW: 15.4 % (ref 11.5–15.5)
WBC: 11.6 10*3/uL — ABNORMAL HIGH (ref 4.0–10.5)
WBC: 5.6 10*3/uL (ref 4.0–10.5)
WBC: 5.6 10*3/uL (ref 4.0–10.5)
WBC: 7.6 10*3/uL (ref 4.0–10.5)

## 2011-01-04 LAB — IMMUNOFIXATION ELECTROPHORESIS: IgA: 165 mg/dL (ref 68–378)

## 2011-01-04 LAB — IGG, IGA, IGM: IgG (Immunoglobin G), Serum: 879 mg/dL (ref 694–1618)

## 2011-01-05 LAB — IMMUNOFIXATION ELECTROPHORESIS
IgA: 275 mg/dL (ref 68–378)
IgG (Immunoglobin G), Serum: 911 mg/dL (ref 694–1618)
IgM, Serum: 95 mg/dL (ref 60–263)

## 2011-01-05 LAB — CBC
HCT: 39.5 % (ref 36.0–46.0)
Hemoglobin: 13.3 g/dL (ref 12.0–15.0)
MCV: 104.9 fL — ABNORMAL HIGH (ref 78.0–100.0)
WBC: 4.9 10*3/uL (ref 4.0–10.5)

## 2011-01-05 LAB — BASIC METABOLIC PANEL
Chloride: 105 mEq/L (ref 96–112)
GFR calc non Af Amer: 57 mL/min — ABNORMAL LOW (ref >60)
Potassium: 4.1 mEq/L (ref 3.5–5.1)
Sodium: 137 mEq/L (ref 135–145)

## 2011-01-05 LAB — DIFFERENTIAL
Lymphocytes Relative: 16 % (ref 12–46)
Lymphs Abs: 0.8 10*3/uL (ref 0.7–4.0)
Monocytes Absolute: 0.5 10*3/uL (ref 0.1–1.0)
Monocytes Relative: 10 % (ref 3–12)
Neutro Abs: 3.5 10*3/uL (ref 1.7–7.7)

## 2011-01-05 LAB — KAPPA/LAMBDA LIGHT CHAINS: Kappa, lambda light chain ratio: 0.23 — ABNORMAL LOW (ref 0.26–1.65)

## 2011-01-05 LAB — PROTEIN ELECTROPH W RFLX QUANT IMMUNOGLOBULINS
Albumin ELP: 57.4 % (ref 55.8–66.1)
Beta 2: 5.8 % (ref 3.2–6.5)
Beta Globulin: 5.6 % (ref 4.7–7.2)
Gamma Globulin: 14.7 % (ref 11.1–18.8)
M-Spike, %: 0.25 g/dL

## 2011-01-05 LAB — IGG, IGA, IGM
IgA: 304 mg/dL (ref 68–378)
IgG (Immunoglobin G), Serum: 987 mg/dL (ref 694–1618)
IgM, Serum: 103 mg/dL (ref 60–263)

## 2011-01-05 LAB — IMMUNOFIXATION ADD-ON

## 2011-01-06 LAB — COMPREHENSIVE METABOLIC PANEL
CO2: 24 mEq/L (ref 19–32)
Calcium: 9.1 mg/dL (ref 8.4–10.5)
Creatinine, Ser: 0.92 mg/dL (ref 0.4–1.2)
GFR calc Af Amer: >60 mL/min (ref >60)
GFR calc non Af Amer: 60 mL/min — ABNORMAL LOW (ref >60)
Glucose, Bld: 108 mg/dL — ABNORMAL HIGH (ref 70–99)
Total Protein: 6.5 g/dL (ref 6.0–8.3)

## 2011-01-07 LAB — DIFFERENTIAL
Eosinophils Relative: 3 % (ref 0–5)
Lymphocytes Relative: 14 % (ref 12–46)
Lymphs Abs: 0.6 10*3/uL — ABNORMAL LOW (ref 0.7–4.0)
Lymphs Abs: 0.6 10*3/uL — ABNORMAL LOW (ref 0.7–4.0)
Monocytes Absolute: 0.5 10*3/uL (ref 0.1–1.0)
Monocytes Relative: 12 % (ref 3–12)
Monocytes Relative: 10 % (ref 3–12)
Neutro Abs: 4 10*3/uL (ref 1.7–7.7)
Neutrophils Relative %: 75 % (ref 43–77)

## 2011-01-07 LAB — PROTEIN ELECTROPH W RFLX QUANT IMMUNOGLOBULINS
Alpha-2-Globulin: 11.7 % (ref 7.1–11.8)
Beta Globulin: 5.7 % (ref 4.7–7.2)
Gamma Globulin: 14.6 % (ref 11.1–18.8)
M-Spike, %: NOT DETECTED g/dL
Total Protein ELP: 6.9 g/dL (ref 6.0–8.3)

## 2011-01-07 LAB — PROTEIN ELECTROPHORESIS, SERUM
Albumin ELP: 54 % — ABNORMAL LOW (ref 55.8–66.1)
Beta 2: 5.7 % (ref 3.2–6.5)

## 2011-01-07 LAB — CBC
HCT: 28.1 % — ABNORMAL LOW (ref 36.0–46.0)
HCT: 38.8 % (ref 36.0–46.0)
Hemoglobin: 9.3 g/dL — ABNORMAL LOW (ref 12.0–15.0)
Hemoglobin: 12.9 g/dL (ref 12.0–15.0)
MCHC: 33.4 g/dL (ref 30.0–36.0)
RBC: 2.73 MIL/uL — ABNORMAL LOW (ref 3.87–5.11)
RDW: 16.5 % — ABNORMAL HIGH (ref 11.5–15.5)
WBC: 4.2 10*3/uL (ref 4.0–10.5)

## 2011-01-07 LAB — IMMUNOFIXATION ELECTROPHORESIS
IgM, Serum: 64 mg/dL (ref 60–263)
Total Protein ELP: 5.5 g/dL — ABNORMAL LOW (ref 6.0–8.3)

## 2011-01-07 LAB — COMPREHENSIVE METABOLIC PANEL
AST: 12 U/L (ref 0–37)
BUN: 24 mg/dL — ABNORMAL HIGH (ref 6–23)
BUN: 29 mg/dL — ABNORMAL HIGH (ref 6–23)
CO2: 29 mEq/L (ref 19–32)
Calcium: 10.7 mg/dL — ABNORMAL HIGH (ref 8.4–10.5)
Chloride: 108 mEq/L (ref 96–112)
Creatinine, Ser: 1 mg/dL (ref 0.4–1.2)
GFR calc Af Amer: >60 mL/min (ref >60)
GFR calc non Af Amer: 54 mL/min — ABNORMAL LOW (ref >60)
Glucose, Bld: 110 mg/dL — ABNORMAL HIGH (ref 70–99)
Glucose, Bld: 77 mg/dL (ref 70–99)
Sodium: 135 mEq/L (ref 135–145)
Total Bilirubin: 0.5 mg/dL (ref 0.3–1.2)
Total Protein: 7 g/dL (ref 6.0–8.3)

## 2011-01-07 LAB — KAPPA/LAMBDA LIGHT CHAINS
Kappa, lambda light chain ratio: 0.34 (ref 0.26–1.65)
Lambda free light chains: 5.33 mg/dL — ABNORMAL HIGH (ref 0.57–2.63)

## 2011-01-07 LAB — IGG, IGA, IGM
IgA: 289 mg/dL (ref 68–378)
IgG (Immunoglobin G), Serum: 935 mg/dL (ref 694–1618)

## 2011-01-08 ENCOUNTER — Ambulatory Visit (HOSPITAL_COMMUNITY): Payer: Medicare Other | Admitting: Oncology

## 2011-01-08 ENCOUNTER — Ambulatory Visit (HOSPITAL_COMMUNITY): Payer: Medicare Other

## 2011-01-08 DIAGNOSIS — C9000 Multiple myeloma not having achieved remission: Secondary | ICD-10-CM

## 2011-01-08 DIAGNOSIS — D638 Anemia in other chronic diseases classified elsewhere: Secondary | ICD-10-CM

## 2011-01-08 DIAGNOSIS — N19 Unspecified kidney failure: Secondary | ICD-10-CM

## 2011-01-08 LAB — CBC
HCT: 26.6 % — ABNORMAL LOW (ref 36.0–46.0)
HCT: 33.8 % — ABNORMAL LOW (ref 36.0–46.0)
Hemoglobin: 11.5 g/dL — ABNORMAL LOW (ref 12.0–15.0)
MCH: 35.7 pg — ABNORMAL HIGH (ref 26.0–34.0)
MCHC: 33.5 g/dL (ref 30.0–36.0)
MCV: 102.6 fL — ABNORMAL HIGH (ref 78.0–100.0)
RBC: 3.3 MIL/uL — ABNORMAL LOW (ref 3.87–5.11)
RDW: 16 % — ABNORMAL HIGH (ref 11.5–15.5)
WBC: 4.6 10*3/uL (ref 4.0–10.5)

## 2011-01-08 LAB — COMPREHENSIVE METABOLIC PANEL
ALT: 15 U/L (ref 0–35)
Alkaline Phosphatase: 36 U/L — ABNORMAL LOW (ref 39–117)
BUN: 30 mg/dL — ABNORMAL HIGH (ref 6–23)
BUN: 19 mg/dL (ref 6–23)
CO2: 25 mEq/L (ref 19–32)
Calcium: 8.9 mg/dL (ref 8.4–10.5)
Chloride: 104 mEq/L (ref 96–112)
Creatinine, Ser: 1.18 mg/dL (ref 0.4–1.2)
Creatinine, Ser: 1.1 mg/dL (ref 0.4–1.2)
GFR calc non Af Amer: 45 mL/min — ABNORMAL LOW (ref >60)
GFR calc non Af Amer: 49 mL/min — ABNORMAL LOW (ref >60)
Glucose, Bld: 131 mg/dL — ABNORMAL HIGH (ref 70–99)
Glucose, Bld: 97 mg/dL (ref 70–99)
Potassium: 3.8 mEq/L (ref 3.5–5.1)
Sodium: 139 mEq/L (ref 135–145)
Total Bilirubin: 0.6 mg/dL (ref 0.3–1.2)
Total Protein: 6.4 g/dL (ref 6.0–8.3)

## 2011-01-08 LAB — DIFFERENTIAL
Basophils Absolute: 0 10*3/uL (ref 0.0–0.1)
Basophils Absolute: 0 10*3/uL (ref 0.0–0.1)
Basophils Relative: 1 % (ref 0–1)
Eosinophils Relative: 0 % (ref 0–5)
Eosinophils Relative: 1 % (ref 0–5)
Lymphocytes Relative: 7 % — ABNORMAL LOW (ref 12–46)
Lymphocytes Relative: 10 % — ABNORMAL LOW (ref 12–46)
Monocytes Absolute: 0.5 10*3/uL (ref 0.1–1.0)
Monocytes Absolute: 0.6 10*3/uL (ref 0.1–1.0)
Monocytes Relative: 8 % (ref 3–12)
Neutro Abs: 3.5 10*3/uL (ref 1.7–7.7)

## 2011-01-09 LAB — COMPREHENSIVE METABOLIC PANEL
ALT: 10 U/L (ref 0–35)
Albumin: 3.3 g/dL — ABNORMAL LOW (ref 3.5–5.2)
Alkaline Phosphatase: 49 U/L (ref 39–117)
BUN: 14 mg/dL (ref 6–23)
Chloride: 107 mEq/L (ref 96–112)
Potassium: 3.5 mEq/L (ref 3.5–5.1)
Sodium: 136 mEq/L (ref 135–145)
Total Bilirubin: 0.2 mg/dL — ABNORMAL LOW (ref 0.3–1.2)
Total Protein: 6.9 g/dL (ref 6.0–8.3)

## 2011-01-09 LAB — DIFFERENTIAL
Basophils Absolute: 0 10*3/uL (ref 0.0–0.1)
Basophils Relative: 0 % (ref 0–1)
Eosinophils Absolute: 0.1 10*3/uL (ref 0.0–0.7)
Eosinophils Relative: 1 % (ref 0–5)
Monocytes Absolute: 0.4 10*3/uL (ref 0.1–1.0)
Monocytes Relative: 5 % (ref 3–12)

## 2011-01-09 LAB — CBC
HCT: 29.4 % — ABNORMAL LOW (ref 36.0–46.0)
Hemoglobin: 10.3 g/dL — ABNORMAL LOW (ref 12.0–15.0)
Platelets: 172 10*3/uL (ref 150–400)
RDW: 17.9 % — ABNORMAL HIGH (ref 11.5–15.5)
WBC: 7.2 10*3/uL (ref 4.0–10.5)

## 2011-01-09 LAB — PROTEIN ELECTROPHORESIS, SERUM
Alpha-2-Globulin: 12.2 % — ABNORMAL HIGH (ref 7.1–11.8)
M-Spike, %: NOT DETECTED g/dL
Total Protein ELP: 6.3 g/dL (ref 6.0–8.3)

## 2011-01-09 LAB — IMMUNOFIXATION ELECTROPHORESIS
IgA: 245 mg/dL (ref 68–378)
IgM, Serum: 85 mg/dL (ref 60–263)
Total Protein ELP: 6.4 g/dL (ref 6.0–8.3)

## 2011-01-09 LAB — KAPPA/LAMBDA LIGHT CHAINS: Lambda free light chains: 2.03 mg/dL (ref 0.57–2.63)

## 2011-01-10 LAB — PROTEIN ELECTROPH W RFLX QUANT IMMUNOGLOBULINS
Albumin ELP: 50.7 % — ABNORMAL LOW (ref 55.8–66.1)
Alpha-1-Globulin: 6.8 % — ABNORMAL HIGH (ref 2.9–4.9)
Beta 2: 6.4 % (ref 3.2–6.5)
Beta Globulin: 6.2 % (ref 4.7–7.2)
Total Protein ELP: 5.6 g/dL — ABNORMAL LOW (ref 6.0–8.3)

## 2011-01-10 LAB — COMPREHENSIVE METABOLIC PANEL
ALT: 12 U/L (ref 0–35)
AST: 16 U/L (ref 0–37)
Albumin: 2.8 g/dL — ABNORMAL LOW (ref 3.5–5.2)
Alkaline Phosphatase: 56 U/L (ref 39–117)
BUN: 32 mg/dL — ABNORMAL HIGH (ref 6–23)
CO2: 25 mEq/L (ref 19–32)
CO2: 25 mEq/L (ref 19–32)
Chloride: 108 mEq/L (ref 96–112)
Chloride: 105 mEq/L (ref 96–112)
Creatinine, Ser: 1.49 mg/dL — ABNORMAL HIGH (ref 0.4–1.2)
GFR calc Af Amer: >60 mL/min (ref >60)
GFR calc non Af Amer: 51 mL/min — ABNORMAL LOW (ref >60)
GFR calc non Af Amer: 34 mL/min — ABNORMAL LOW (ref >60)
Glucose, Bld: 109 mg/dL — ABNORMAL HIGH (ref 70–99)
Potassium: 3.4 mEq/L — ABNORMAL LOW (ref 3.5–5.1)
Sodium: 139 mEq/L (ref 135–145)
Total Bilirubin: 0.7 mg/dL (ref 0.3–1.2)
Total Bilirubin: 0.4 mg/dL (ref 0.3–1.2)

## 2011-01-10 LAB — DIFFERENTIAL
Basophils Absolute: 0 10*3/uL (ref 0.0–0.1)
Basophils Absolute: 0 10*3/uL (ref 0.0–0.1)
Basophils Relative: 0 % (ref 0–1)
Eosinophils Absolute: 0.2 10*3/uL (ref 0.0–0.7)
Eosinophils Relative: 3 % (ref 0–5)
Lymphocytes Relative: 13 % (ref 12–46)
Monocytes Absolute: 0.3 10*3/uL (ref 0.1–1.0)
Neutro Abs: 2.7 10*3/uL (ref 1.7–7.7)
Neutrophils Relative %: 71 % (ref 43–77)

## 2011-01-10 LAB — CROSSMATCH: ABO/RH(D): A POS

## 2011-01-10 LAB — PROTEIN ELECTROPHORESIS, SERUM
Albumin ELP: 44.7 % — ABNORMAL LOW (ref 55.8–66.1)
Alpha-2-Globulin: 12.1 % — ABNORMAL HIGH (ref 7.1–11.8)
Beta 2: 4.3 % (ref 3.2–6.5)
Gamma Globulin: 16.3 % (ref 11.1–18.8)
M-Spike, %: NOT DETECTED g/dL
M-Spike, %: NOT DETECTED g/dL
Total Protein ELP: 6.6 g/dL (ref 6.0–8.3)

## 2011-01-10 LAB — CBC
HCT: 24.4 % — ABNORMAL LOW (ref 36.0–46.0)
Hemoglobin: 8.1 g/dL — ABNORMAL LOW (ref 12.0–15.0)
MCV: 101.4 fL — ABNORMAL HIGH (ref 78.0–100.0)
Platelets: 257 10*3/uL (ref 150–400)
RBC: 3 MIL/uL — ABNORMAL LOW (ref 3.87–5.11)
WBC: 5.2 10*3/uL (ref 4.0–10.5)
WBC: 3.9 10*3/uL — ABNORMAL LOW (ref 4.0–10.5)

## 2011-01-10 LAB — IMMUNOFIXATION ELECTROPHORESIS
IgA: 222 mg/dL (ref 68–378)
IgG (Immunoglobin G), Serum: 1070 mg/dL (ref 694–1618)
IgM, Serum: 125 mg/dL (ref 60–263)
IgM, Serum: 38 mg/dL — ABNORMAL LOW (ref 60–263)
Total Protein ELP: 6.4 g/dL (ref 6.0–8.3)
Total Protein ELP: 6.1 g/dL (ref 6.0–8.3)

## 2011-01-12 ENCOUNTER — Ambulatory Visit (HOSPITAL_COMMUNITY): Payer: Medicare Other | Admitting: Oncology

## 2011-01-12 DIAGNOSIS — C9000 Multiple myeloma not having achieved remission: Secondary | ICD-10-CM

## 2011-01-14 LAB — DIFFERENTIAL
Basophils Absolute: 0 10*3/uL (ref 0.0–0.1)
Basophils Relative: 0 % (ref 0–1)
Lymphocytes Relative: 9 % — ABNORMAL LOW (ref 12–46)
Monocytes Absolute: 0.4 10*3/uL (ref 0.1–1.0)
Neutro Abs: 6.4 10*3/uL (ref 1.7–7.7)
Neutrophils Relative %: 84 % — ABNORMAL HIGH (ref 43–77)

## 2011-01-14 LAB — COMPREHENSIVE METABOLIC PANEL
Alkaline Phosphatase: 52 U/L (ref 39–117)
BUN: 17 mg/dL (ref 6–23)
CO2: 24 mEq/L (ref 19–32)
Chloride: 107 mEq/L (ref 96–112)
Creatinine, Ser: 1.22 mg/dL — ABNORMAL HIGH (ref 0.4–1.2)
GFR calc non Af Amer: 43 mL/min — ABNORMAL LOW (ref >60)
Glucose, Bld: 99 mg/dL (ref 70–99)
Potassium: 4 mEq/L (ref 3.5–5.1)
Total Bilirubin: 0.7 mg/dL (ref 0.3–1.2)

## 2011-01-14 LAB — PROTEIN ELECTROPHORESIS, SERUM
Albumin ELP: 51.5 % — ABNORMAL LOW (ref 55.8–66.1)
Beta 2: 6 % (ref 3.2–6.5)
Total Protein ELP: 5.7 g/dL — ABNORMAL LOW (ref 6.0–8.3)

## 2011-01-14 LAB — IMMUNOFIXATION ELECTROPHORESIS
IgG (Immunoglobin G), Serum: 1050 mg/dL (ref 694–1618)
IgM, Serum: 78 mg/dL (ref 60–263)
Total Protein ELP: 5.8 g/dL — ABNORMAL LOW (ref 6.0–8.3)

## 2011-01-14 LAB — CBC
HCT: 29.3 % — ABNORMAL LOW (ref 36.0–46.0)
Hemoglobin: 10.1 g/dL — ABNORMAL LOW (ref 12.0–15.0)
MCV: 97.1 fL (ref 78.0–100.0)
WBC: 7.6 10*3/uL (ref 4.0–10.5)

## 2011-01-15 ENCOUNTER — Ambulatory Visit (HOSPITAL_COMMUNITY): Payer: Medicare Other

## 2011-01-15 DIAGNOSIS — R609 Edema, unspecified: Secondary | ICD-10-CM

## 2011-01-15 DIAGNOSIS — C9000 Multiple myeloma not having achieved remission: Secondary | ICD-10-CM

## 2011-01-15 DIAGNOSIS — N179 Acute kidney failure, unspecified: Secondary | ICD-10-CM

## 2011-01-16 ENCOUNTER — Ambulatory Visit (HOSPITAL_COMMUNITY): Payer: Medicare Other | Admitting: Oncology

## 2011-01-22 ENCOUNTER — Encounter (HOSPITAL_COMMUNITY): Payer: Medicare Other | Attending: Oncology

## 2011-01-22 DIAGNOSIS — N189 Chronic kidney disease, unspecified: Secondary | ICD-10-CM

## 2011-01-22 DIAGNOSIS — C9000 Multiple myeloma not having achieved remission: Secondary | ICD-10-CM

## 2011-01-22 DIAGNOSIS — Z79899 Other long term (current) drug therapy: Secondary | ICD-10-CM | POA: Insufficient documentation

## 2011-01-22 DIAGNOSIS — E538 Deficiency of other specified B group vitamins: Secondary | ICD-10-CM | POA: Insufficient documentation

## 2011-01-22 DIAGNOSIS — D638 Anemia in other chronic diseases classified elsewhere: Secondary | ICD-10-CM

## 2011-01-23 LAB — DIFFERENTIAL
Eosinophils Absolute: 0.1 10*3/uL (ref 0.0–0.7)
Eosinophils Relative: 3 % (ref 0–5)
Lymphocytes Relative: 11 % — ABNORMAL LOW (ref 12–46)
Lymphs Abs: 0.4 10*3/uL — ABNORMAL LOW (ref 0.7–4.0)
Monocytes Absolute: 0.6 10*3/uL (ref 0.1–1.0)

## 2011-01-23 LAB — URINALYSIS, ROUTINE W REFLEX MICROSCOPIC
Bilirubin Urine: NEGATIVE
Ketones, ur: NEGATIVE mg/dL
Specific Gravity, Urine: 1.02 (ref 1.005–1.030)
Urobilinogen, UA: 1 mg/dL (ref 0.0–1.0)

## 2011-01-23 LAB — URINE CULTURE

## 2011-01-23 LAB — FOLATE: Folate: >20 ng/mL

## 2011-01-23 LAB — URINE MICROSCOPIC-ADD ON

## 2011-01-23 LAB — CBC
Hemoglobin: 9.4 g/dL — ABNORMAL LOW (ref 12.0–15.0)
MCHC: 33.2 g/dL (ref 30.0–36.0)
MCV: 104 fL — ABNORMAL HIGH (ref 78.0–100.0)
Platelets: 133 10*3/uL — ABNORMAL LOW (ref 150–400)
RBC: 3.15 MIL/uL — ABNORMAL LOW (ref 3.87–5.11)
RDW: 16.1 % — ABNORMAL HIGH (ref 11.5–15.5)
WBC: 3.5 10*3/uL — ABNORMAL LOW (ref 4.0–10.5)

## 2011-01-23 LAB — TSH: TSH: 1.17 u[IU]/mL (ref 0.350–4.500)

## 2011-01-23 LAB — CLOSTRIDIUM DIFFICILE EIA: C difficile Toxins A+B, EIA: NEGATIVE

## 2011-01-23 LAB — COMPREHENSIVE METABOLIC PANEL
ALT: 8 U/L (ref 0–35)
AST: 10 U/L (ref 0–37)
Alkaline Phosphatase: 49 U/L (ref 39–117)
CO2: 20 mEq/L (ref 19–32)
Chloride: 109 mEq/L (ref 96–112)
GFR calc Af Amer: 40 mL/min — ABNORMAL LOW (ref >60)
GFR calc non Af Amer: 33 mL/min — ABNORMAL LOW (ref >60)
Sodium: 138 mEq/L (ref 135–145)
Total Bilirubin: 0.5 mg/dL (ref 0.3–1.2)

## 2011-01-23 LAB — RETICULOCYTES
Retic Count, Absolute: 46.4 10*3/uL (ref 19.0–186.0)
Retic Ct Pct: 1.7 % (ref 0.4–3.1)

## 2011-01-23 LAB — IRON AND TIBC
Saturation Ratios: 23 % (ref 20–55)
TIBC: 160 ug/dL — ABNORMAL LOW (ref 250–470)

## 2011-01-23 LAB — FECAL LACTOFERRIN, QUANT: Fecal Lactoferrin: POSITIVE

## 2011-01-23 LAB — HEMOGLOBIN AND HEMATOCRIT, BLOOD
HCT: 30.9 % — ABNORMAL LOW (ref 36.0–46.0)
Hemoglobin: 10.3 g/dL — ABNORMAL LOW (ref 12.0–15.0)

## 2011-01-23 LAB — STOOL CULTURE

## 2011-01-23 LAB — BASIC METABOLIC PANEL
BUN: 33 mg/dL — ABNORMAL HIGH (ref 6–23)
CO2: 17 mEq/L — ABNORMAL LOW (ref 19–32)
Calcium: 8.4 mg/dL (ref 8.4–10.5)
GFR calc non Af Amer: 35 mL/min — ABNORMAL LOW (ref >60)
Glucose, Bld: 81 mg/dL (ref 70–99)
Sodium: 137 mEq/L (ref 135–145)

## 2011-01-24 LAB — BASIC METABOLIC PANEL
BUN: 44 mg/dL — ABNORMAL HIGH (ref 6–23)
CO2: 20 mEq/L (ref 19–32)
Glucose, Bld: 88 mg/dL (ref 70–99)
Potassium: 5.1 mEq/L (ref 3.5–5.1)
Sodium: 139 mEq/L (ref 135–145)

## 2011-01-24 LAB — COMPREHENSIVE METABOLIC PANEL
AST: 9 U/L (ref 0–37)
Albumin: 3.4 g/dL — ABNORMAL LOW (ref 3.5–5.2)
BUN: 39 mg/dL — ABNORMAL HIGH (ref 6–23)
Creatinine, Ser: 1.78 mg/dL — ABNORMAL HIGH (ref 0.4–1.2)
GFR calc Af Amer: 34 mL/min — ABNORMAL LOW (ref >60)
Potassium: 5.8 mEq/L — ABNORMAL HIGH (ref 3.5–5.1)
Total Protein: 6.4 g/dL (ref 6.0–8.3)

## 2011-01-25 LAB — PROTEIN ELECTROPHORESIS, SERUM
Albumin ELP: 56.4 % (ref 55.8–66.1)
Alpha-1-Globulin: 5.8 % — ABNORMAL HIGH (ref 2.9–4.9)
Beta Globulin: 6.3 % (ref 4.7–7.2)
Total Protein ELP: 7.3 g/dL (ref 6.0–8.3)

## 2011-01-25 LAB — CBC
HCT: 32.7 % — ABNORMAL LOW (ref 36.0–46.0)
Hemoglobin: 11.1 g/dL — ABNORMAL LOW (ref 12.0–15.0)
MCHC: 34 g/dL (ref 30.0–36.0)
MCV: 104.4 fL — ABNORMAL HIGH (ref 78.0–100.0)
RDW: 16.1 % — ABNORMAL HIGH (ref 11.5–15.5)

## 2011-01-25 LAB — COMPREHENSIVE METABOLIC PANEL
Alkaline Phosphatase: 42 U/L (ref 39–117)
BUN: 61 mg/dL — ABNORMAL HIGH (ref 6–23)
Calcium: 9.5 mg/dL (ref 8.4–10.5)
Creatinine, Ser: 2.67 mg/dL — ABNORMAL HIGH (ref 0.4–1.2)
Glucose, Bld: 85 mg/dL (ref 70–99)
Total Protein: 7.1 g/dL (ref 6.0–8.3)

## 2011-01-25 LAB — IMMUNOFIXATION ELECTROPHORESIS
IgA: 184 mg/dL (ref 68–378)
IgM, Serum: 72 mg/dL (ref 60–263)
Total Protein ELP: 7.2 g/dL (ref 6.0–8.3)

## 2011-01-25 LAB — DIFFERENTIAL
Basophils Relative: 1 % (ref 0–1)
Lymphocytes Relative: 14 % (ref 12–46)
Lymphs Abs: 0.4 10*3/uL — ABNORMAL LOW (ref 0.7–4.0)
Monocytes Relative: 12 % (ref 3–12)
Neutro Abs: 2.2 10*3/uL (ref 1.7–7.7)
Neutrophils Relative %: 70 % (ref 43–77)

## 2011-01-26 LAB — CBC
HCT: 29.7 % — ABNORMAL LOW (ref 36.0–46.0)
Hemoglobin: 9.8 g/dL — ABNORMAL LOW (ref 12.0–15.0)
MCHC: 34.5 g/dL (ref 30.0–36.0)
MCV: 103.6 fL — ABNORMAL HIGH (ref 78.0–100.0)
MCV: 103.9 fL — ABNORMAL HIGH (ref 78.0–100.0)
RBC: 2.87 MIL/uL — ABNORMAL LOW (ref 3.87–5.11)
RBC: 2.72 MIL/uL — ABNORMAL LOW (ref 3.87–5.11)
WBC: 4 10*3/uL (ref 4.0–10.5)

## 2011-01-26 LAB — PROTEIN ELECTROPHORESIS, SERUM
Alpha-1-Globulin: 6.9 % — ABNORMAL HIGH (ref 2.9–4.9)
Beta 2: 3.2 % (ref 3.2–6.5)
Gamma Globulin: 12 % (ref 11.1–18.8)
M-Spike, %: NOT DETECTED g/dL

## 2011-01-26 LAB — COMPREHENSIVE METABOLIC PANEL
ALT: 10 U/L (ref 0–35)
BUN: 30 mg/dL — ABNORMAL HIGH (ref 6–23)
BUN: 30 mg/dL — ABNORMAL HIGH (ref 6–23)
CO2: 24 mEq/L (ref 19–32)
CO2: 21 mEq/L (ref 19–32)
Calcium: 9 mg/dL (ref 8.4–10.5)
Calcium: 9.6 mg/dL (ref 8.4–10.5)
Chloride: 112 mEq/L (ref 96–112)
Chloride: 108 mEq/L (ref 96–112)
Creatinine, Ser: 1.24 mg/dL — ABNORMAL HIGH (ref 0.4–1.2)
GFR calc non Af Amer: 42 mL/min — ABNORMAL LOW (ref >60)
GFR calc non Af Amer: 33 mL/min — ABNORMAL LOW (ref >60)
Glucose, Bld: 91 mg/dL (ref 70–99)
Glucose, Bld: 89 mg/dL (ref 70–99)
Sodium: 138 mEq/L (ref 135–145)
Sodium: 137 mEq/L (ref 135–145)
Total Bilirubin: 0.7 mg/dL (ref 0.3–1.2)
Total Bilirubin: 0.6 mg/dL (ref 0.3–1.2)
Total Protein: 6.1 g/dL (ref 6.0–8.3)

## 2011-01-26 LAB — DIFFERENTIAL
Basophils Absolute: 0 10*3/uL (ref 0.0–0.1)
Basophils Absolute: 0 10*3/uL (ref 0.0–0.1)
Basophils Relative: 1 % (ref 0–1)
Eosinophils Absolute: 0.1 10*3/uL (ref 0.0–0.7)
Eosinophils Relative: 4 % (ref 0–5)
Lymphocytes Relative: 11 % — ABNORMAL LOW (ref 12–46)
Lymphs Abs: 1.5 10*3/uL (ref 0.7–4.0)
Neutro Abs: 2.9 10*3/uL (ref 1.7–7.7)
Neutrophils Relative %: 34 % — ABNORMAL LOW (ref 43–77)

## 2011-01-26 LAB — IMMUNOFIXATION ELECTROPHORESIS
IgG (Immunoglobin G), Serum: 735 mg/dL (ref 694–1618)
Total Protein ELP: 6.4 g/dL (ref 6.0–8.3)

## 2011-01-26 LAB — PROTEIN ELECTROPH W RFLX QUANT IMMUNOGLOBULINS
Alpha-1-Globulin: 5.6 % — ABNORMAL HIGH (ref 2.9–4.9)
Alpha-2-Globulin: 14.1 % — ABNORMAL HIGH (ref 7.1–11.8)
Total Protein ELP: 6.3 g/dL (ref 6.0–8.3)

## 2011-01-27 LAB — UIFE/LIGHT CHAINS/TP QN, 24-HR UR
Albumin, U: DETECTED
Alpha 1, Urine: NOT DETECTED
Alpha 2, Urine: NOT DETECTED
Beta, Urine: NOT DETECTED
Free Kappa/Lambda Ratio: 14.51 ratio — ABNORMAL HIGH (ref 0.46–4.00)
Total Protein, Urine: 11.8 mg/dL
Volume, Urine: 1900 mL

## 2011-01-27 LAB — PROTEIN ELECTROPH W RFLX QUANT IMMUNOGLOBULINS
Alpha-2-Globulin: 13.8 % — ABNORMAL HIGH (ref 7.1–11.8)
Beta 2: 5 % (ref 3.2–6.5)
Beta Globulin: 5.7 % (ref 4.7–7.2)
Gamma Globulin: 9.7 % — ABNORMAL LOW (ref 11.1–18.8)
M-Spike, %: NOT DETECTED g/dL

## 2011-01-27 LAB — COMPREHENSIVE METABOLIC PANEL
ALT: 12 U/L (ref 0–35)
Albumin: 3.6 g/dL (ref 3.5–5.2)
Alkaline Phosphatase: 56 U/L (ref 39–117)
BUN: 25 mg/dL — ABNORMAL HIGH (ref 6–23)
Chloride: 114 mEq/L — ABNORMAL HIGH (ref 96–112)
Glucose, Bld: 89 mg/dL (ref 70–99)
Potassium: 5.3 mEq/L — ABNORMAL HIGH (ref 3.5–5.1)
Sodium: 138 mEq/L (ref 135–145)
Total Bilirubin: 0.6 mg/dL (ref 0.3–1.2)
Total Protein: 6.4 g/dL (ref 6.0–8.3)

## 2011-01-27 LAB — DIFFERENTIAL
Basophils Absolute: 0 10*3/uL (ref 0.0–0.1)
Basophils Relative: 1 % (ref 0–1)
Eosinophils Absolute: 0 10*3/uL (ref 0.0–0.7)
Monocytes Absolute: 0.4 10*3/uL (ref 0.1–1.0)
Monocytes Relative: 14 % — ABNORMAL HIGH (ref 3–12)
Neutro Abs: 1.2 10*3/uL — ABNORMAL LOW (ref 1.7–7.7)
Neutrophils Relative %: 45 % (ref 43–77)

## 2011-01-27 LAB — CBC
HCT: 27.9 % — ABNORMAL LOW (ref 36.0–46.0)
Hemoglobin: 9.7 g/dL — ABNORMAL LOW (ref 12.0–15.0)
RDW: 20.2 % — ABNORMAL HIGH (ref 11.5–15.5)
WBC: 2.6 10*3/uL — ABNORMAL LOW (ref 4.0–10.5)

## 2011-01-28 LAB — COMPREHENSIVE METABOLIC PANEL
AST: 14 U/L (ref 0–37)
Albumin: 3.5 g/dL (ref 3.5–5.2)
BUN: 43 mg/dL — ABNORMAL HIGH (ref 6–23)
Calcium: 10.5 mg/dL (ref 8.4–10.5)
Chloride: 108 mEq/L (ref 96–112)
Creatinine, Ser: 1.54 mg/dL — ABNORMAL HIGH (ref 0.4–1.2)
GFR calc Af Amer: 40 mL/min — ABNORMAL LOW (ref >60)
Total Bilirubin: 1.3 mg/dL — ABNORMAL HIGH (ref 0.3–1.2)
Total Protein: 6 g/dL (ref 6.0–8.3)

## 2011-01-28 LAB — CBC
HCT: 27.8 % — ABNORMAL LOW (ref 36.0–46.0)
MCHC: 34.7 g/dL (ref 30.0–36.0)
MCV: 100.2 fL — ABNORMAL HIGH (ref 78.0–100.0)
Platelets: 110 10*3/uL — ABNORMAL LOW (ref 150–400)
RDW: 25.2 % — ABNORMAL HIGH (ref 11.5–15.5)

## 2011-01-28 LAB — IMMUNOFIXATION ELECTROPHORESIS
IgG (Immunoglobin G), Serum: 597 mg/dL — ABNORMAL LOW (ref 694–1618)
IgM, Serum: 34 mg/dL — ABNORMAL LOW (ref 60–263)
Total Protein ELP: 6.2 g/dL (ref 6.0–8.3)

## 2011-01-28 LAB — PROTEIN ELECTROPHORESIS, SERUM
Albumin ELP: 60.4 % (ref 55.8–66.1)
Alpha-1-Globulin: 8.2 % — ABNORMAL HIGH (ref 2.9–4.9)
Alpha-2-Globulin: 12.8 % — ABNORMAL HIGH (ref 7.1–11.8)
Beta 2: 3.5 % (ref 3.2–6.5)

## 2011-01-28 LAB — DIFFERENTIAL
Basophils Absolute: 0 10*3/uL (ref 0.0–0.1)
Lymphocytes Relative: 7 % — ABNORMAL LOW (ref 12–46)
Lymphs Abs: 0.3 10*3/uL — ABNORMAL LOW (ref 0.7–4.0)
Monocytes Absolute: 0.4 10*3/uL (ref 0.1–1.0)
Neutro Abs: 3.7 10*3/uL (ref 1.7–7.7)

## 2011-01-29 ENCOUNTER — Encounter (HOSPITAL_COMMUNITY): Payer: Medicare Other

## 2011-01-29 ENCOUNTER — Ambulatory Visit (HOSPITAL_COMMUNITY): Payer: Medicare Other | Admitting: Oncology

## 2011-01-29 DIAGNOSIS — C9000 Multiple myeloma not having achieved remission: Secondary | ICD-10-CM

## 2011-01-29 DIAGNOSIS — D649 Anemia, unspecified: Secondary | ICD-10-CM

## 2011-01-29 LAB — CBC
Hemoglobin: 7.9 g/dL — CL (ref 12.0–15.0)
Platelets: 99 10*3/uL — ABNORMAL LOW (ref 150–400)
RDW: 18.2 % — ABNORMAL HIGH (ref 11.5–15.5)
WBC: 5.2 10*3/uL (ref 4.0–10.5)

## 2011-01-29 LAB — COMPREHENSIVE METABOLIC PANEL
ALT: 14 U/L (ref 0–35)
AST: 12 U/L (ref 0–37)
Albumin: 3.1 g/dL — ABNORMAL LOW (ref 3.5–5.2)
Alkaline Phosphatase: 54 U/L (ref 39–117)
Potassium: 5 mEq/L (ref 3.5–5.1)
Sodium: 135 mEq/L (ref 135–145)
Total Protein: 5.9 g/dL — ABNORMAL LOW (ref 6.0–8.3)

## 2011-01-29 LAB — PROTEIN ELECTROPHORESIS, SERUM
Albumin ELP: 57.3 % (ref 55.8–66.1)
Beta 2: 5 % (ref 3.2–6.5)
Beta Globulin: 5.6 % (ref 4.7–7.2)
M-Spike, %: NOT DETECTED g/dL

## 2011-01-29 LAB — CROSSMATCH: Antibody Screen: NEGATIVE

## 2011-01-29 LAB — DIFFERENTIAL
Basophils Relative: 0 % (ref 0–1)
Eosinophils Absolute: 0.1 10*3/uL (ref 0.0–0.7)
Eosinophils Relative: 1 % (ref 0–5)
Monocytes Absolute: 0.2 10*3/uL (ref 0.1–1.0)
Monocytes Relative: 4 % (ref 3–12)

## 2011-01-29 LAB — IMMUNOFIXATION ELECTROPHORESIS: IgM, Serum: 34 mg/dL — ABNORMAL LOW (ref 60–263)

## 2011-01-30 LAB — COMPREHENSIVE METABOLIC PANEL
ALT: 17 U/L (ref 0–35)
AST: 14 U/L (ref 0–37)
Albumin: 3.8 g/dL (ref 3.5–5.2)
Alkaline Phosphatase: 48 U/L (ref 39–117)
BUN: 58 mg/dL — ABNORMAL HIGH (ref 6–23)
Chloride: 104 mEq/L (ref 96–112)
GFR calc Af Amer: 38 mL/min — ABNORMAL LOW (ref >60)
Potassium: 4.9 mEq/L (ref 3.5–5.1)
Sodium: 135 mEq/L (ref 135–145)
Total Bilirubin: 1.4 mg/dL — ABNORMAL HIGH (ref 0.3–1.2)

## 2011-01-30 LAB — PROTEIN ELECTROPH W RFLX QUANT IMMUNOGLOBULINS
Albumin ELP: 61.3 % (ref 55.8–66.1)
Alpha-1-Globulin: 5.6 % — ABNORMAL HIGH (ref 2.9–4.9)
Alpha-2-Globulin: 13.7 % — ABNORMAL HIGH (ref 7.1–11.8)
Beta 2: 4.5 % (ref 3.2–6.5)
Beta Globulin: 5.8 % (ref 4.7–7.2)
Total Protein ELP: 6.4 g/dL (ref 6.0–8.3)

## 2011-01-30 LAB — DIFFERENTIAL
Basophils Relative: 0 % (ref 0–1)
Lymphocytes Relative: 11 % — ABNORMAL LOW (ref 12–46)
Lymphs Abs: 1 10*3/uL (ref 0.7–4.0)
Monocytes Absolute: 0.2 10*3/uL (ref 0.1–1.0)
Monocytes Relative: 2 % — ABNORMAL LOW (ref 3–12)
Neutro Abs: 7.4 10*3/uL (ref 1.7–7.7)
Neutrophils Relative %: 87 % — ABNORMAL HIGH (ref 43–77)

## 2011-01-30 LAB — CBC
HCT: 32.1 % — ABNORMAL LOW (ref 36.0–46.0)
Platelets: 104 10*3/uL — ABNORMAL LOW (ref 150–400)
WBC: 8.5 10*3/uL (ref 4.0–10.5)

## 2011-01-31 LAB — PROTEIN ELECTROPHORESIS, SERUM
Gamma Globulin: 10.1 % — ABNORMAL LOW (ref 11.1–18.8)
M-Spike, %: NOT DETECTED g/dL
Total Protein ELP: 6.4 g/dL (ref 6.0–8.3)

## 2011-01-31 LAB — DIFFERENTIAL
Basophils Absolute: 0 10*3/uL (ref 0.0–0.1)
Basophils Relative: 0 % (ref 0–1)
Lymphocytes Relative: 11 % — ABNORMAL LOW (ref 12–46)
Monocytes Relative: 7 % (ref 3–12)
Neutro Abs: 3.3 10*3/uL (ref 1.7–7.7)
Neutrophils Relative %: 80 % — ABNORMAL HIGH (ref 43–77)

## 2011-01-31 LAB — COMPREHENSIVE METABOLIC PANEL
Alkaline Phosphatase: 67 U/L (ref 39–117)
BUN: 33 mg/dL — ABNORMAL HIGH (ref 6–23)
Creatinine, Ser: 1.28 mg/dL — ABNORMAL HIGH (ref 0.4–1.2)
Glucose, Bld: 88 mg/dL (ref 70–99)
Potassium: 4.2 mEq/L (ref 3.5–5.1)
Total Bilirubin: 1.1 mg/dL (ref 0.3–1.2)
Total Protein: 6.2 g/dL (ref 6.0–8.3)

## 2011-01-31 LAB — CBC
HCT: 37.4 % (ref 36.0–46.0)
Hemoglobin: 12.8 g/dL (ref 12.0–15.0)
MCV: 100.1 fL — ABNORMAL HIGH (ref 78.0–100.0)
Platelets: 85 10*3/uL — ABNORMAL LOW (ref 150–400)
RDW: 17.9 % — ABNORMAL HIGH (ref 11.5–15.5)

## 2011-01-31 LAB — HEMOGLOBIN A1C: Mean Plasma Glucose: 103 mg/dL

## 2011-01-31 LAB — IMMUNOFIXATION ELECTROPHORESIS
IgA: 138 mg/dL (ref 68–378)
IgG (Immunoglobin G), Serum: 693 mg/dL — ABNORMAL LOW (ref 694–1618)
Total Protein ELP: 6.6 g/dL (ref 6.0–8.3)

## 2011-02-01 LAB — COMPREHENSIVE METABOLIC PANEL
AST: 13 U/L (ref 0–37)
CO2: 23 mEq/L (ref 19–32)
Calcium: 9 mg/dL (ref 8.4–10.5)
Creatinine, Ser: 1.22 mg/dL — ABNORMAL HIGH (ref 0.4–1.2)
GFR calc Af Amer: 52 mL/min — ABNORMAL LOW (ref >60)
GFR calc non Af Amer: 43 mL/min — ABNORMAL LOW (ref >60)
Glucose, Bld: 93 mg/dL (ref 70–99)

## 2011-02-01 LAB — DIFFERENTIAL
Basophils Relative: 0 % (ref 0–1)
Eosinophils Absolute: 0.1 10*3/uL (ref 0.0–0.7)
Eosinophils Absolute: 0.2 10*3/uL (ref 0.0–0.7)
Eosinophils Relative: 2 % (ref 0–5)
Eosinophils Relative: 6 % — ABNORMAL HIGH (ref 0–5)
Lymphs Abs: 0.3 10*3/uL — ABNORMAL LOW (ref 0.7–4.0)
Lymphs Abs: 0.4 10*3/uL — ABNORMAL LOW (ref 0.7–4.0)
Monocytes Absolute: 0.5 10*3/uL (ref 0.1–1.0)
Monocytes Relative: 18 % — ABNORMAL HIGH (ref 3–12)
Neutro Abs: 1.7 10*3/uL (ref 1.7–7.7)
Neutrophils Relative %: 61 % (ref 43–77)
nRBC: 0 /100 WBC

## 2011-02-01 LAB — CBC
HCT: 36.9 % (ref 36.0–46.0)
HCT: 38 % (ref 36.0–46.0)
MCHC: 33.4 g/dL (ref 30.0–36.0)
MCHC: 34.1 g/dL (ref 30.0–36.0)
MCHC: 33.6 g/dL (ref 30.0–36.0)
MCV: 101.1 fL — ABNORMAL HIGH (ref 78.0–100.0)
MCV: 102.6 fL — ABNORMAL HIGH (ref 78.0–100.0)
MCV: 101.2 fL — ABNORMAL HIGH (ref 78.0–100.0)
Platelets: 85 10*3/uL — ABNORMAL LOW (ref 150–400)
Platelets: 89 10*3/uL — ABNORMAL LOW (ref 150–400)
RBC: 3.49 MIL/uL — ABNORMAL LOW (ref 3.87–5.11)
RDW: 18.7 % — ABNORMAL HIGH (ref 11.5–15.5)
RDW: 19.2 % — ABNORMAL HIGH (ref 11.5–15.5)
WBC: 3.8 10*3/uL — ABNORMAL LOW (ref 4.0–10.5)

## 2011-02-01 LAB — PROTEIN ELECTROPHORESIS, SERUM
Beta 2: 4.7 % (ref 3.2–6.5)
Beta Globulin: 5.9 % (ref 4.7–7.2)
M-Spike, %: NOT DETECTED g/dL

## 2011-02-01 LAB — RETICULOCYTES: Retic Ct Pct: 0.4 % (ref 0.4–3.1)

## 2011-02-01 NOTE — Discharge Summary (Signed)
NAME:  CATHERYNE, DEFORD                ACCOUNT NO.:  [id number]  MEDICAL RECORD NO.:  [id number]           PATIENT TYPE:  I  LOCATION:  A334                          FACILITY:  [facility]  PHYSICIAN:  Salle Brandle L. Lendell Caprice, MDDATE OF BIRTH:  07/25/1933  DATE OF ADMISSION:  12/26/2010 DATE OF DISCHARGE:  03/09/2012LH                              DISCHARGE SUMMARY   DISCHARGE DIAGNOSES: 1. Community-acquired pneumonia. 2. Urinary tract infection. 3. Acute renal failure. 4. Hyperkalemia. 5. Chronic edema. 6. Thrush. 7. Anemia. 8. Chronic thrombocytopenia. 9. IgA lambda multiple myeloma. 10.Morbid obesity 11.History of vitamin B12 deficiency. 12.Chronic low back pain. 13.Deconditioning.  DISCHARGE MEDICATIONS: 1. Tylenol 650 mg p.o. q.4 h. p.r.n. pain or fever. 2. Albuterol nebulizer 2.5 mg every 2 hours as needed for wheezing. 3. Azithromycin 250 mg p.o. on December 27, 2008, only. 4. Cefdinir 300 mg p.o. b.i.d. until January 05, 2011, then stop. 5. Colace 100 mg p.o. b.i.d. 6. Robitussin DM 5 mL p.o. q.4 h. p.r.n. cough. 7. Hydrocodone/APAP 5/325 mg 1-2 tablets every 6 hours as needed for     severe pain. 8. Calcium 600 mg a day. 9. Furosemide 20 mg p.o. every other day.  May need to increase     frequency if swelling worsens. 10.Diazepam 5 mg p.o. q.8 h. p.r.n. muscle spasm or anxiety. 11.Folic acid 1 mg a day. 12.Iron dextran 150 mg p.o. daily. 13.Omeprazole 20 mg a day. 14.Prednisone 5 mg a day. 15.Vitamin E 1000 units daily. 16.Vitamin D3 2000 units twice a day. 17.Zovirax 200 mg twice a day.  Stop amlodipine.  Stop Aldactone.  Stop Methadone.  Stop KCl.  Stop Lorazepam.  CONDITION:  Stable.  ACTIVITY:  Fall precautions.  Continue physical therapy and occupational therapy.  FOLLOWUP:  She will need to follow up with Dr. Mariel Sleet.  She will need daily weight and if significant fluid retention consider changing Lasix back to once a day.  She will need a repeat basic  metabolic panel and a CBC within a week.  She will need follow up with nursing home physician within a week.  CONDITION:  Stable.  CONSULTATIONS:  Ladona Horns. Neijstrom, MD  PROCEDURES:  None.  DIET:  Diet should be heart healthy.  LABORATORY DATA:  CBC on admission significant for white blood cell count of 12,000, hemoglobin 9.8, hematocrit 28.7, MCV is 103, and platelet count 63,000.  Her hemoglobin dropped to a low of 7.2 and she was transfused 1 unit of packed red blood cells.  At discharge, her hemoglobin is stable at 8.0, her white blood cell count is 7700, and platelet count 46,000.  On admission, sodium was 132, potassium 6.4, chloride 101, bicarbonate 28, glucose 144, BUN 64, and creatinine 3.16. At discharge, her sodium is 138, potassium 3.7, glucose 117, BUN 26, and creatinine 1.42.  Liver function tests remarkable for an albumin of 2.0 and a total protein of 4.8.  Phosphorus is 5.  Magnesium is 1.7.  Serial cardiac enzymes were significant for troponin of 0.07 and repeat strands remained flat.  TSH 0.424.  B12 normal.  Urinalysis showed a specific gravity of greater than  1.030, small bilirubin, trace protein, positive nitrite, small leukocyte esterase, many squamous epithelial cells, hyaline casts, 21-50 white cells, 3-6 red cells, many bacteria. Influenza Ab and H1N1 were negative.  Hemoccult of the stool was positive.  Respiratory culture grew out normal oropharyngeal flora. Urine Legionella antigen negative.  Urine culture grew out greater than 75,000 colonies of multiple bacterial morphotypes.  DIAGNOSTICS:  Chest x-ray showed right lung pneumonia, stable cardiomegaly.  H and P showed sinus tachycardia with PACs and incomplete right bundle-branch block, left anterior fascicular block, and moderate LVH.  HISTORY AND HOSPITAL COURSE:  Please see H and P for details.  Ms. Simerson is a 77 year old white female with multiple medical problems including multiple myeloma.   She had recently received chemotherapy and had become progressively weaker.  She was unable to perform activities of daily living and lives alone.  She also had a cough and shortness of breath.  She had yellow to white-colored sputum.  She on admission had a pulse of 101, otherwise normal vital signs.  She appeared ill.  She had thrush, nonlabored breathing, few wheezes, and rales.  She had trace pedal edema and according to her daughter had severe edema 3 weeks ago. Laboratory evaluation was remarkable for hyperkalemia and acute renal failure.  Her creatinine in February 2012, was 2.1; in December, creatinine was 1.39.  She had been on Aldactone and potassium as an outpatient.  Her hyperkalemia was treated medically and her potassium and spironolactone were stopped.  Also, her amlodipine was stopped.  She was given gentle hydration and started on broad-spectrum antibiotics as she is immunosuppressed from her multiple myeloma and recent chemotherapy as well as prednisone.  Her cough and shortness of breath improved.  Her potassium normalized and her creatinine improved to near baseline.  Her blood pressure has been normal off amlodipine and I have stopped it as it could contribute to her swelling.  I have started her Lasix back every other day and this may need to be adjusted based on her fluid status and renal function.Marland Kitchen  She was given Diflucan for her thrush.  She required an unit of blood.  She has chronic thrombocytopenia.  She had a heme-positive stool, but is not overtly bleeding and due to her poor functional status and multiple medical issues endoscopy would only be an option once she is stable medically and if she has continued bleeding.  Her cough improved.  Her shortness of breath improved.  She still profoundly deconditioned and family is considering skilled nursing facility placement as they can work out a Insurance claims handler.  Her code status is full code.  Total time on the day of  discharge is greater than 30 minutes.     Della Homan L. Lendell Caprice, MD     CLS/MEDQ  D:  12/29/2010  T:  12/29/2010  Job:  [id number]  cc:   Ladona Horns. Mariel Sleet, MD Fax: [phone]  [name], M.D. Fax: [phone]  Electronically Signed by Crista Curb MD on 02/01/2011 11:01:09 AM

## 2011-02-02 ENCOUNTER — Ambulatory Visit (HOSPITAL_COMMUNITY): Payer: Medicare Other | Admitting: Oncology

## 2011-02-05 ENCOUNTER — Encounter (HOSPITAL_COMMUNITY): Payer: Medicare Other

## 2011-02-05 ENCOUNTER — Encounter (HOSPITAL_COMMUNITY): Payer: Medicare Other | Admitting: Oncology

## 2011-02-05 ENCOUNTER — Ambulatory Visit (HOSPITAL_COMMUNITY): Payer: Medicare Other | Admitting: Oncology

## 2011-02-05 DIAGNOSIS — E538 Deficiency of other specified B group vitamins: Secondary | ICD-10-CM

## 2011-02-05 DIAGNOSIS — C9000 Multiple myeloma not having achieved remission: Secondary | ICD-10-CM

## 2011-02-05 DIAGNOSIS — D649 Anemia, unspecified: Secondary | ICD-10-CM

## 2011-02-05 DIAGNOSIS — N19 Unspecified kidney failure: Secondary | ICD-10-CM

## 2011-02-05 LAB — DIFFERENTIAL
Blasts: 0 %
Eosinophils Absolute: 0.1 10*3/uL (ref 0.0–0.7)
Eosinophils Relative: 1 % (ref 0–5)
Myelocytes: 0 %
Neutro Abs: 5.9 10*3/uL (ref 1.7–7.7)
Neutrophils Relative %: 81 % — ABNORMAL HIGH (ref 43–77)
Promyelocytes Absolute: 0 %
nRBC: 0 /100 WBC

## 2011-02-05 LAB — COMPREHENSIVE METABOLIC PANEL
ALT: 13 U/L (ref 0–35)
CO2: 22 mEq/L (ref 19–32)
Calcium: 9.5 mg/dL (ref 8.4–10.5)
Chloride: 109 mEq/L (ref 96–112)
GFR calc non Af Amer: 43 mL/min — ABNORMAL LOW (ref >60)
Glucose, Bld: 84 mg/dL (ref 70–99)
Sodium: 137 mEq/L (ref 135–145)
Total Bilirubin: 1.1 mg/dL (ref 0.3–1.2)

## 2011-02-05 LAB — PROTEIN ELECTROPH W RFLX QUANT IMMUNOGLOBULINS
Albumin ELP: 55.9 % (ref 55.8–66.1)
Alpha-1-Globulin: 8.5 % — ABNORMAL HIGH (ref 2.9–4.9)
Alpha-2-Globulin: 16.5 % — ABNORMAL HIGH (ref 7.1–11.8)
Beta 2: 3.7 % (ref 3.2–6.5)
Beta Globulin: 6.1 % (ref 4.7–7.2)
Total Protein ELP: 6 g/dL (ref 6.0–8.3)

## 2011-02-05 LAB — IMMUNOFIXATION ELECTROPHORESIS
IgA: 107 mg/dL (ref 68–378)
IgM, Serum: 69 mg/dL (ref 60–263)

## 2011-02-05 LAB — CBC
Hemoglobin: 8.3 g/dL — ABNORMAL LOW (ref 12.0–15.0)
MCHC: 33.6 g/dL (ref 30.0–36.0)
MCV: 103.2 fL — ABNORMAL HIGH (ref 78.0–100.0)
RBC: 2.39 MIL/uL — ABNORMAL LOW (ref 3.87–5.11)

## 2011-02-06 LAB — CROSSMATCH
ABO/RH(D): A POS
Antibody Screen: NEGATIVE

## 2011-02-06 LAB — COMPREHENSIVE METABOLIC PANEL
ALT: 13 U/L (ref 0–35)
AST: 9 U/L (ref 0–37)
Albumin: 3.4 g/dL — ABNORMAL LOW (ref 3.5–5.2)
Alkaline Phosphatase: 69 U/L (ref 39–117)
GFR calc Af Amer: >60 mL/min (ref >60)
Potassium: 4.6 mEq/L (ref 3.5–5.1)
Sodium: 138 mEq/L (ref 135–145)
Total Protein: 6.3 g/dL (ref 6.0–8.3)

## 2011-02-06 LAB — CBC
Hemoglobin: 11.4 g/dL — ABNORMAL LOW (ref 12.0–15.0)
MCHC: 34 g/dL (ref 30.0–36.0)
Platelets: 151 10*3/uL (ref 150–400)
RBC: 2.17 MIL/uL — ABNORMAL LOW (ref 3.87–5.11)
RBC: 3.32 MIL/uL — ABNORMAL LOW (ref 3.87–5.11)
WBC: 4 10*3/uL (ref 4.0–10.5)
WBC: 5.3 10*3/uL (ref 4.0–10.5)

## 2011-02-06 LAB — PROTEIN ELECTROPH W RFLX QUANT IMMUNOGLOBULINS
Albumin ELP: 57.8 % (ref 55.8–66.1)
Alpha-2-Globulin: 14.9 % — ABNORMAL HIGH (ref 7.1–11.8)
Beta Globulin: 6.2 % (ref 4.7–7.2)
M-Spike, %: NOT DETECTED g/dL
Total Protein ELP: 6.4 g/dL (ref 6.0–8.3)

## 2011-02-06 LAB — HEMOCCULT GUIAC POC 1CARD (OFFICE)
Fecal Occult Bld: NEGATIVE
Fecal Occult Bld: NEGATIVE
Fecal Occult Bld: POSITIVE

## 2011-02-06 LAB — SAMPLE TO BLOOD BANK

## 2011-02-06 LAB — RETICULOCYTES
RBC.: 2.17 MIL/uL — ABNORMAL LOW (ref 3.87–5.11)
RBC.: 3.32 MIL/uL — ABNORMAL LOW (ref 3.87–5.11)
Retic Count, Absolute: 69.4 10*3/uL (ref 19.0–186.0)
Retic Count, Absolute: 96.3 10*3/uL (ref 19.0–186.0)
Retic Ct Pct: 2.9 % (ref 0.4–3.1)

## 2011-02-06 LAB — VITAMIN B12: Vitamin B-12: 737 pg/mL (ref 211–911)

## 2011-02-06 LAB — DIFFERENTIAL
Basophils Relative: 1 % (ref 0–1)
Eosinophils Absolute: 0.1 10*3/uL (ref 0.0–0.7)
Lymphs Abs: 0.5 10*3/uL — ABNORMAL LOW (ref 0.7–4.0)
Monocytes Absolute: 0.6 10*3/uL (ref 0.1–1.0)
Monocytes Relative: 16 % — ABNORMAL HIGH (ref 3–12)

## 2011-02-06 LAB — HAPTOGLOBIN: Haptoglobin: 211 mg/dL — ABNORMAL HIGH (ref 16–200)

## 2011-02-06 LAB — FERRITIN: Ferritin: 560 ng/mL — ABNORMAL HIGH (ref 10–291)

## 2011-02-06 LAB — IMMUNOFIXATION ELECTROPHORESIS: IgM, Serum: 47 mg/dL — ABNORMAL LOW (ref 60–263)

## 2011-02-06 LAB — FOLATE: Folate: >20 ng/mL

## 2011-02-08 ENCOUNTER — Ambulatory Visit (INDEPENDENT_AMBULATORY_CARE_PROVIDER_SITE_OTHER): Payer: Medicare Other | Admitting: Cardiology

## 2011-02-08 ENCOUNTER — Encounter: Payer: Self-pay | Admitting: Cardiology

## 2011-02-08 DIAGNOSIS — M810 Age-related osteoporosis without current pathological fracture: Secondary | ICD-10-CM | POA: Insufficient documentation

## 2011-02-08 DIAGNOSIS — R0989 Other specified symptoms and signs involving the circulatory and respiratory systems: Secondary | ICD-10-CM

## 2011-02-08 DIAGNOSIS — G629 Polyneuropathy, unspecified: Secondary | ICD-10-CM | POA: Insufficient documentation

## 2011-02-08 DIAGNOSIS — G8929 Other chronic pain: Secondary | ICD-10-CM | POA: Insufficient documentation

## 2011-02-08 DIAGNOSIS — C9000 Multiple myeloma not having achieved remission: Secondary | ICD-10-CM | POA: Insufficient documentation

## 2011-02-08 DIAGNOSIS — D510 Vitamin B12 deficiency anemia due to intrinsic factor deficiency: Secondary | ICD-10-CM | POA: Insufficient documentation

## 2011-02-08 DIAGNOSIS — K219 Gastro-esophageal reflux disease without esophagitis: Secondary | ICD-10-CM | POA: Insufficient documentation

## 2011-02-08 DIAGNOSIS — N182 Chronic kidney disease, stage 2 (mild): Secondary | ICD-10-CM | POA: Insufficient documentation

## 2011-02-08 DIAGNOSIS — E669 Obesity, unspecified: Secondary | ICD-10-CM | POA: Insufficient documentation

## 2011-02-08 DIAGNOSIS — IMO0002 Reserved for concepts with insufficient information to code with codable children: Secondary | ICD-10-CM | POA: Insufficient documentation

## 2011-02-08 NOTE — Progress Notes (Signed)
[name] was referred by Dr. Mariel Sleet for cardiology reassessment after a hiatus of nearly 4 years.  In the past, she had been seen for hypertension and pedal edema.  She has no known cardiac disease other than hypertension.  She has a host of serious medical problems including multiple myeloma and was recently discharged from a nursing home stay.  Unfortunately, no information was available at the time of her office visit.  Specifically, the prior [facility] chart, her recent echocardiogram and office records from Dr. Mariel Sleet were unavailable.  Moreover, it was the opinion of the patient and her family that she is being referred for left ventricular dysfunction although her echocardiogram showed a small segmental wall motion abnormality and a normal ejection fraction.  The patient was asked to defer evaluation until adequate information could be obtained and was not seen in the office other than for a brief conversation.  She reported severe pedal edema that occurred around the time of her hospitalization and transfer to a skilled nursing facility.  Previously, she developed severe edema when treated with steroids.  Since her last visit with Dr. Mariel Sleet at which time a question of diastolic dysfunction was raised, pedal edema has improved dramatically.  The patient denies chest discomfort, dyspnea, orthopnea or PND.  The echocardiogram was reviewed and does show normal or near normal left ventricular systolic function without significant valvular abnormalities.  EKG shows sinus rhythm, left atrial abnormality, left axis deviation, LVH and delayed R-wave progression representing a possible anteroseptal MI.  The patient's weight was 223, 10 pounds less than when I last saw her in June of 2008.  She had no apparent jugular venous distention and had clear lung fields on exam.  At this point, I would not be inclined to perform further cardiac diagnostic testing and/or to add any pharmacologic therapy.  A  segmental wall motion abnormality on her echocardiogram suggests possible coronary disease as does her EKG; however, she is asymptomatic at the present time with significant additional medical problems including multiple myeloma, hypertension, peripheral neuropathy and mild chronic kidney disease.  I will plan to reassess his status will be in 6 months to make sure that there has been no deterioration in her cardiac status.

## 2011-02-12 ENCOUNTER — Encounter (HOSPITAL_COMMUNITY): Payer: Medicare Other

## 2011-02-12 ENCOUNTER — Encounter (HOSPITAL_COMMUNITY): Payer: Medicare Other | Admitting: Oncology

## 2011-02-12 ENCOUNTER — Ambulatory Visit (HOSPITAL_COMMUNITY): Payer: Medicare Other

## 2011-02-12 DIAGNOSIS — C9000 Multiple myeloma not having achieved remission: Secondary | ICD-10-CM

## 2011-02-12 DIAGNOSIS — N289 Disorder of kidney and ureter, unspecified: Secondary | ICD-10-CM

## 2011-02-12 DIAGNOSIS — D638 Anemia in other chronic diseases classified elsewhere: Secondary | ICD-10-CM

## 2011-02-13 ENCOUNTER — Encounter: Payer: Self-pay | Admitting: Cardiology

## 2011-02-17 ENCOUNTER — Encounter: Payer: Self-pay | Admitting: Cardiology

## 2011-02-19 ENCOUNTER — Encounter (HOSPITAL_COMMUNITY): Payer: Medicare Other

## 2011-02-19 DIAGNOSIS — D638 Anemia in other chronic diseases classified elsewhere: Secondary | ICD-10-CM

## 2011-02-19 DIAGNOSIS — R609 Edema, unspecified: Secondary | ICD-10-CM

## 2011-02-19 DIAGNOSIS — N189 Chronic kidney disease, unspecified: Secondary | ICD-10-CM

## 2011-02-19 DIAGNOSIS — C9000 Multiple myeloma not having achieved remission: Secondary | ICD-10-CM

## 2011-02-20 ENCOUNTER — Encounter: Payer: Self-pay | Admitting: Cardiology

## 2011-02-26 ENCOUNTER — Encounter (HOSPITAL_COMMUNITY): Payer: Medicare Other | Attending: Oncology

## 2011-02-26 DIAGNOSIS — N289 Disorder of kidney and ureter, unspecified: Secondary | ICD-10-CM

## 2011-02-26 DIAGNOSIS — D649 Anemia, unspecified: Secondary | ICD-10-CM

## 2011-02-26 DIAGNOSIS — E538 Deficiency of other specified B group vitamins: Secondary | ICD-10-CM | POA: Insufficient documentation

## 2011-02-26 DIAGNOSIS — C9000 Multiple myeloma not having achieved remission: Secondary | ICD-10-CM | POA: Insufficient documentation

## 2011-02-26 DIAGNOSIS — Z79899 Other long term (current) drug therapy: Secondary | ICD-10-CM | POA: Insufficient documentation

## 2011-03-05 ENCOUNTER — Other Ambulatory Visit (HOSPITAL_COMMUNITY): Payer: Self-pay | Admitting: Oncology

## 2011-03-05 ENCOUNTER — Encounter (HOSPITAL_COMMUNITY): Payer: Medicare Other

## 2011-03-05 ENCOUNTER — Encounter (HOSPITAL_COMMUNITY): Payer: Medicare Other | Attending: Oncology

## 2011-03-05 DIAGNOSIS — M1990 Unspecified osteoarthritis, unspecified site: Secondary | ICD-10-CM

## 2011-03-05 DIAGNOSIS — C9000 Multiple myeloma not having achieved remission: Secondary | ICD-10-CM

## 2011-03-05 DIAGNOSIS — D649 Anemia, unspecified: Secondary | ICD-10-CM

## 2011-03-05 DIAGNOSIS — E538 Deficiency of other specified B group vitamins: Secondary | ICD-10-CM | POA: Insufficient documentation

## 2011-03-05 DIAGNOSIS — N189 Chronic kidney disease, unspecified: Secondary | ICD-10-CM

## 2011-03-05 DIAGNOSIS — Z79899 Other long term (current) drug therapy: Secondary | ICD-10-CM | POA: Insufficient documentation

## 2011-03-05 LAB — CBC
HCT: 38.8 % (ref 36.0–46.0)
Hemoglobin: 12.2 g/dL (ref 12.0–15.0)
MCHC: 31.4 g/dL (ref 30.0–36.0)
WBC: 9.2 10*3/uL (ref 4.0–10.5)

## 2011-03-05 LAB — COMPREHENSIVE METABOLIC PANEL
ALT: 12 U/L (ref 0–35)
AST: 15 U/L (ref 0–37)
Alkaline Phosphatase: 43 U/L (ref 39–117)
CO2: 29 mEq/L (ref 19–32)
Calcium: 10.6 mg/dL — ABNORMAL HIGH (ref 8.4–10.5)
GFR calc Af Amer: 43 mL/min — ABNORMAL LOW (ref >60)
GFR calc non Af Amer: 36 mL/min — ABNORMAL LOW (ref >60)
Glucose, Bld: 101 mg/dL — ABNORMAL HIGH (ref 70–99)
Potassium: 4.2 mEq/L (ref 3.5–5.1)
Sodium: 139 mEq/L (ref 135–145)
Total Protein: 6.7 g/dL (ref 6.0–8.3)

## 2011-03-05 LAB — DIFFERENTIAL
Basophils Absolute: 0 10*3/uL (ref 0.0–0.1)
Basophils Relative: 0 % (ref 0–1)
Lymphocytes Relative: 4 % — ABNORMAL LOW (ref 12–46)
Monocytes Absolute: 0.7 10*3/uL (ref 0.1–1.0)
Neutro Abs: 8.1 10*3/uL — ABNORMAL HIGH (ref 1.7–7.7)
Neutrophils Relative %: 88 % — ABNORMAL HIGH (ref 43–77)

## 2011-03-06 LAB — KAPPA/LAMBDA LIGHT CHAINS
Kappa free light chain: 1.52 mg/dL (ref 0.33–1.94)
Lambda free light chains: 1.71 mg/dL (ref 0.57–2.63)

## 2011-03-06 NOTE — Procedures (Signed)
NAME:  [name], [name]                ACCOUNT NO.:  [id number]   MEDICAL RECORD NO.:  [id number]          PATIENT TYPE:  OUT   LOCATION:  RAD                           FACILITY:  [facility]   PHYSICIAN:  Gerrit Friends. Dietrich Pates, MD, FACCDATE OF BIRTH:  01-Nov-1932   DATE OF PROCEDURE:  03/25/2007  DATE OF DISCHARGE:                                ECHOCARDIOGRAM   REFERRING:  Dr. Mariel Sleet and Dr. Dietrich Pates.   CLINICAL DATA:  Congestive heart failure.  Aorta 3.1, left atrium 4.5,  septum 1.9, posterior wall 1.5, LV diastole 4.0, LV systole 2.9.   1. Technically suboptimal but adequate echocardiographic study.  2. Left atrial size at the upper limit of normal; normal right atrium.  3. Mild right ventricular dilatation; mild to moderate hypertrophy;      normal systolic function.  4. Structurally normal aortic valve; minimal insufficiency.  5. Normal proximal ascending aorta.  6. Normal pulmonic valve and proximal pulmonary artery.  7. Normal mitral valve and tricuspid valve.      Gerrit Friends. Dietrich Pates, MD, [facility]  Electronically Signed     RMR/MEDQ  D:  03/26/2007  T:  03/26/2007  Job:  [id number]

## 2011-03-06 NOTE — Letter (Signed)
April 15, 2007    Patrica Duel, M.D.  [address]  [place]  [place] [address]   RE:  KRISANN, MCKENNA  MRN:  [id number]  /  DOB:  [birth year]   Dear Loraine Leriche:    Ms. Eckles returns to the office for continued assessment and treatment  of hypertension and edema.  Since her last visit, she has felt and done  well.  She is active without any cardiopulmonary symptoms.  Peripheral  edema has markedly improved.   CURRENT MEDICATIONS:  Include:  1. Zometa.  2. Benicar 40 mg daily.  3. Calcium and vitamin D daily.  4. Thalidomide.  nightly.  5. Alkeran per Dr. Thornton Papas schedule.  6. Decadron per Dr. Thornton Papas schedule.  7. Furosemide 20 mg every other day.   EXAM:  A pleasant overweight woman in no acute distress.  The weight is 233, four pounds less than at her last visit.  Blood  pressure 110/80, heart rate 60 and regular, respirations 16.  NECK:  No jugular venous distention.  LUNGS:  Clear.  CARDIAC:  Normal 1st and 2nd heart sounds; grade 1-2/6 scratchy systolic  ejection murmur at the cardiac base.  ABDOMEN:  Soft, nontender; no masses; no organomegaly.  EXTREMITIES:  Normal distal pulses; trace ankle edema.   Most recent chemistry profile performed March 31, 2007:  Normal potassium  of 4.9.  BUN and creatinine elevated to 47 and 1.6.  Lipid profile was  excellent.   Echocardiogram was entirely normal.   IMPRESSION:  Ms. Bettinger now has excellent control of hypertension.  Her  pedal edema at least partially reflects her treatment with steroids and  is also under excellent control with a low dose of diuretic.  Furosemide  dosage was reduced after prerenal azotemia was identified.  She is  scheduled to have a repeat metabolic profile.  I will reassess this nice  woman in 6 months.    Sincerely,      Gerrit Friends. Dietrich Pates, MD, [facility]  Electronically Signed    RMR/MedQ  DD: 04/15/2007  DT: 04/15/2007  Job #: [id number]   CC:    Ladona Horns. Mariel Sleet, MD

## 2011-03-06 NOTE — Letter (Signed)
March 24, 2007    Ladona Horns. Neijstrom, MD  [address]  [place], [place] [address]   RE:  [name], [name]  MRN:  [id number]  /  DOB:  02-Jul-1933   Dear [name]:   It was my pleasure evaluating [name] in the office today for  peripheral edema and fluid retention. She has no prior cardiac history.  She has never been seen by a cardiologist nor undergone any significant  cardiac testing. She previously took Demodex on a daily basis, but this  was discontinued at the time she was found to have developed multiple  myeloma. She also uses HCTZ in an antihypertensive combination with  Benicar. Since Decadron was added to her medical regime, she has noted  fluid retention, significant pedal edema and some dyspnea and exertion  intolerance. With p.r.n. use of diuretics, this has improved, but she  has developed substantial hypokalemia. Her medications are currently  being adjusted to address this issue.   [name] has had no chest discomfort. Her dyspnea with moderate  exertion resolves promptly with rest. There are no associated symptoms.  The intensity is mild to moderate. This has been noted only within the  past few weeks since starting steroids.   CURRENT MEDICATIONS:  1. Zometa.  2. Benicar HCT 40/25 mg daily.  3. Calcium with a vitamin D supplement.  4. Aspirin 325 mg daily.  5. Thalidomide.  6. Alkeran.  7. Decadron 4 days per month.  8. Lasix 20 mg.  9. Demodex 20 mg on p.r.n. basis.   The patient has no known drug allergies.   There is a history of hypertension. Lipid status is unknown. She has not  had diabetes. She has not been a cigarette smoker.   PAST MEDICAL HISTORY:  Mostly notable for minor surgical procedures. She  remotely underwent venectomy bilaterally, ORIF of the right ankle for a  fracture, TAH with incidental appendectomy, right carpal tunnel repair  and left rotator cuff repair.   SOCIAL HISTORY:  Retired; widowed; 2 children live locally and are  involved with her care.   FAMILY HISTORY:  Father died due to abdominal aortic aneurysm; mother  had asthma. She has 4 siblings, one of whom have heart problems.   REVIEW OF SYSTEMS:  Notable for the need for corrective lenses,  bilateral cataracts, upper and lower dentures, a remote history of  colitis and osteoporosis.   PHYSICAL EXAMINATION:  GENERAL:  A pleasant, overweight woman.  VITAL SIGNS:  Weight 237, blood pressure 110/80, heart rate 75 and  regular, respirations 15.  HEENT:  Anicteric sclera; normal lids and conjunctiva.  NECK:  No jugular venous distention; normal carotid upstrokes without  bruits.  ENDOCRINE:  No thyromegaly.  HEMATOPOIETIC:  No adenopathy.  SKIN:  No significant lesions.  PSYCHIATRIC:  Alert and oriented; normal affect.  LUNGS:  Clear.  CARDIAC:  Normal first and second heart sounds; modest systolic ejection  murmur.  ABDOMEN:  Soft and nontender; no organomegaly.  EXTREMITIES:  Surgical scar is over both ankles; 1/2+ ankle edema;  distal pulses intact.  NEUROMUSCULAR:  Symmetric strength and tone; normal cranial nerves.   EKG:  Normal sinus rhythm; minimal nonspecific ST segment abnormality;  leftward axis; otherwise normal.   Chemistry profile from 3 days ago shows a BUN of 28, creatinine of 1.13  and a potassium of 2.9.   IMPRESSION:  [name] has a history of fluid retention that is  exacerbated by steroids. There is little to  implicate significant  cardiac problems. We will proceed with an echocardiogram and a BNP level  as well as a lipid profile.   She requires a consistent approach to management of fluids and  electrolyte disturbance. I would appreciate your deference in my  managing this issue for the time being. I have changed Benicar HCT to  Benicar alone. She will resume Demodex at a decreased dose of 10 mg per  day. She will take potassium as you instructed. We will monitor serum  potassium levels and consider whether a  potassium-sparing diuretic  should be added. I will plan to see this nice woman again in 3 weeks.    Sincerely,      Gerrit Friends. Dietrich Pates, MD, [facility]  Electronically Signed    RMR/MedQ  DD: 03/24/2007  DT: 03/24/2007  Job #: [id number]   CC:    Patrica Duel, M.D.

## 2011-03-07 LAB — PROTEIN ELECTROPH W RFLX QUANT IMMUNOGLOBULINS
Alpha-2-Globulin: 13.2 % — ABNORMAL HIGH (ref 7.1–11.8)
Beta 2: 5.1 % (ref 3.2–6.5)
Beta Globulin: 6.1 % (ref 4.7–7.2)
Gamma Globulin: 12.6 % (ref 11.1–18.8)
M-Spike, %: NOT DETECTED g/dL

## 2011-03-07 LAB — IMMUNOFIXATION ELECTROPHORESIS: IgA: 148 mg/dL (ref 68–378)

## 2011-03-09 NOTE — H&P (Signed)
NAME:  [name], [name]                ACCOUNT NO.:  [id number]   MEDICAL RECORD NO.:  [id number]          PATIENT TYPE:  AMB   LOCATION:  DAY                           FACILITY:  [facility]   PHYSICIAN:  Dalia Heading, M.D.  DATE OF BIRTH:  1933/08/21   DATE OF ADMISSION:  DATE OF DISCHARGE:  LH                                HISTORY & PHYSICAL   CHIEF COMPLAINT:  Follicular cyst, scalp.   HISTORY OF PRESENT ILLNESS:  The patient is a 77 year old white female who  is referred for evaluation and treatment of a follicular cyst on her scalp.  It has been present for over 2 months.  She has been treated with  antibiotics for infection.  No drainage has been noted.  The cyst seems to  be enlarging in size.   PAST MEDICAL HISTORY:  Includes hypertension.   PAST SURGICAL HISTORY:  Hysterectomy, carpal tunnel release.   CURRENT MEDICATIONS:  1. Benicar 1 tablet p.o. daily.  2. Keflex 1 tablet p.o. t.i.d.   ALLERGIES:  NO KNOWN DRUG ALLERGIES.   REVIEW OF SYSTEMS:  Noncontributory.   On physical examination, the patient is a well-developed, well-nourished  white female in no acute distress.  LUNGS:  Clear to auscultation with equal breath sounds bilaterally.  HEART:  Examination reveals regular rate and rhythm without S3, S4 or  murmurs.  SCALP:  Examination reveals a 2 cm inflamed cyst on the right frontal region  of the scalp.  No drainage is noted.  It is tender to touch.   IMPRESSION:  Follicular cyst, scalp.   PLAN:  The patient is scheduled for excision of the cyst, scalp on September 16, 2006.  The risks and benefits of the procedure including bleeding,  infection, and recurrence of the cyst were fully explained to the patient,  who gave informed consent.      Dalia Heading, M.D.  Electronically Signed     MAJ/MEDQ  D:  09/10/2006  T:  09/10/2006  Job:  [id number]   cc:   Patrica Duel, M.D.  Fax: [phone]

## 2011-03-09 NOTE — Op Note (Signed)
NAME:  [name], [name]                ACCOUNT NO.:  [id number]   MEDICAL RECORD NO.:  [id number]          PATIENT TYPE:  AMB   LOCATION:  DAY                           FACILITY:  [facility]   PHYSICIAN:  Dalia Heading, M.D.  DATE OF BIRTH:  [birth year]   DATE OF PROCEDURE:  09/16/2006  DATE OF DISCHARGE:                                 OPERATIVE REPORT   PREOPERATIVE DIAGNOSIS:  Follicular cyst, scalp.   POSTOPERATIVE DIAGNOSIS:  Granuloma of skull.   PROCEDURE:  Excision of granuloma, skull.   SURGEON:  Franky Macho, M.D.   ANESTHESIA:  General.   INDICATIONS:  The patient is a 77 year old white female who presents for  excision of what was presumed to be a follicular cyst of her scalp.  The  risks and benefits of the procedure were fully explained to the patient, who  gave informed consent.   PROCEDURE NOTE:  The patient was placed in the supine position.  After  general anesthesia was administered, the right frontoparietal region of the  scalp was prepped and draped using the usual sterile technique with  Betadine.  A small mound of the area over the mass, which was approximately  2-3 cm in size, was shaved.  Surgical site confirmation was performed.   A longitudinal incision was made over the mass.  Initially, pressure was  used to express the contents.  The contents were noted to be granulomatous  in nature.  No pus was noted.  On further inspection of the deep portion of  the wound, the patient was noted to have a quarter size defect in the skull.  No CSF fluid leakage was noted.  I did not continue to explore the base of  this wound given the findings.  The subcutaneous layer, which appeared to  have the galea present, was reapproximated using 4-0 Vicryl interrupted  sutures.  The skin was closed using 4-0 nylon interrupted sutures.  Sensorcaine 0.5%  was instilled in the surrounding wound.  Neosporin  ointment was then applied.   All tape and needle counts were correct and  the end of the procedure.  The  patient was awakened and transferred to PACU in stable condition.  No  neurologic defects deficits were noted at the time of neuro exam in the  PACU.   Dr. Gerlene Fee of Neurosurgery in [place] was contacted.  A CT scan of the  skull as well as MRI of the brain have been ordered.  He feels there is  nothing more surgically to do at this point.  The family has been made aware  of the findings.   COMPLICATIONS:  None.   SPECIMEN:  Granuloma, skull.   BLOOD LOSS:  Minimal.      Dalia Heading, M.D.  Electronically Signed     MAJ/MEDQ  D:  09/16/2006  T:  09/16/2006  Job:  [id number]   cc:   Patrica Duel, M.D.  Fax: [phone]

## 2011-03-12 ENCOUNTER — Encounter (HOSPITAL_COMMUNITY): Payer: Medicare Other

## 2011-03-12 DIAGNOSIS — N189 Chronic kidney disease, unspecified: Secondary | ICD-10-CM

## 2011-03-12 DIAGNOSIS — D649 Anemia, unspecified: Secondary | ICD-10-CM

## 2011-03-12 DIAGNOSIS — C9000 Multiple myeloma not having achieved remission: Secondary | ICD-10-CM

## 2011-03-26 ENCOUNTER — Encounter (HOSPITAL_COMMUNITY): Payer: Medicare Other | Attending: Oncology

## 2011-03-26 DIAGNOSIS — E538 Deficiency of other specified B group vitamins: Secondary | ICD-10-CM | POA: Insufficient documentation

## 2011-03-26 DIAGNOSIS — C9000 Multiple myeloma not having achieved remission: Secondary | ICD-10-CM

## 2011-03-26 DIAGNOSIS — D638 Anemia in other chronic diseases classified elsewhere: Secondary | ICD-10-CM

## 2011-03-26 DIAGNOSIS — N189 Chronic kidney disease, unspecified: Secondary | ICD-10-CM

## 2011-03-26 DIAGNOSIS — Z79899 Other long term (current) drug therapy: Secondary | ICD-10-CM | POA: Insufficient documentation

## 2011-04-02 ENCOUNTER — Other Ambulatory Visit (HOSPITAL_COMMUNITY): Payer: Self-pay | Admitting: Oncology

## 2011-04-02 ENCOUNTER — Encounter (HOSPITAL_COMMUNITY): Payer: Medicare Other

## 2011-04-02 DIAGNOSIS — C9000 Multiple myeloma not having achieved remission: Secondary | ICD-10-CM

## 2011-04-02 DIAGNOSIS — E538 Deficiency of other specified B group vitamins: Secondary | ICD-10-CM

## 2011-04-02 LAB — DIFFERENTIAL
Lymphocytes Relative: 4 % — ABNORMAL LOW (ref 12–46)
Lymphs Abs: 0.4 10*3/uL — ABNORMAL LOW (ref 0.7–4.0)
Monocytes Absolute: 0.8 10*3/uL (ref 0.1–1.0)
Monocytes Relative: 9 % (ref 3–12)
Neutro Abs: 7.8 10*3/uL — ABNORMAL HIGH (ref 1.7–7.7)
Neutrophils Relative %: 87 % — ABNORMAL HIGH (ref 43–77)

## 2011-04-02 LAB — COMPREHENSIVE METABOLIC PANEL
ALT: 10 U/L (ref 0–35)
Alkaline Phosphatase: 47 U/L (ref 39–117)
BUN: 43 mg/dL — ABNORMAL HIGH (ref 6–23)
CO2: 29 mEq/L (ref 19–32)
Calcium: 10.8 mg/dL — ABNORMAL HIGH (ref 8.4–10.5)
GFR calc Af Amer: 39 mL/min — ABNORMAL LOW (ref >60)
GFR calc non Af Amer: 33 mL/min — ABNORMAL LOW (ref >60)
Glucose, Bld: 88 mg/dL (ref 70–99)
Total Protein: 7.2 g/dL (ref 6.0–8.3)

## 2011-04-02 LAB — CBC
HCT: 42.9 % (ref 36.0–46.0)
Hemoglobin: 13.7 g/dL (ref 12.0–15.0)
MCH: 33.2 pg (ref 26.0–34.0)
MCHC: 31.9 g/dL (ref 30.0–36.0)
MCV: 103.9 fL — ABNORMAL HIGH (ref 78.0–100.0)
RBC: 4.13 MIL/uL (ref 3.87–5.11)

## 2011-04-03 LAB — KAPPA/LAMBDA LIGHT CHAINS
Kappa free light chain: 2.21 mg/dL — ABNORMAL HIGH (ref 0.33–1.94)
Kappa, lambda light chain ratio: 1.23 (ref 0.26–1.65)

## 2011-04-04 LAB — PROTEIN ELECTROPH W RFLX QUANT IMMUNOGLOBULINS
Albumin ELP: 58.3 % (ref 55.8–66.1)
Beta Globulin: 5.5 % (ref 4.7–7.2)
M-Spike, %: NOT DETECTED g/dL
Total Protein ELP: 6.8 g/dL (ref 6.0–8.3)

## 2011-04-04 LAB — IMMUNOFIXATION ELECTROPHORESIS
IgA: 173 mg/dL (ref 68–380)
IgG (Immunoglobin G), Serum: 837 mg/dL (ref 690–1700)
Total Protein ELP: 6.9 g/dL (ref 6.0–8.3)

## 2011-04-09 ENCOUNTER — Ambulatory Visit (HOSPITAL_COMMUNITY): Payer: Medicare Other

## 2011-04-10 ENCOUNTER — Encounter (HOSPITAL_COMMUNITY): Payer: Self-pay | Admitting: *Deleted

## 2011-04-14 ENCOUNTER — Other Ambulatory Visit (HOSPITAL_COMMUNITY): Payer: Self-pay | Admitting: Oncology

## 2011-04-14 ENCOUNTER — Encounter (HOSPITAL_COMMUNITY): Payer: Self-pay | Admitting: Oncology

## 2011-04-14 DIAGNOSIS — E538 Deficiency of other specified B group vitamins: Secondary | ICD-10-CM | POA: Insufficient documentation

## 2011-04-14 DIAGNOSIS — D638 Anemia in other chronic diseases classified elsewhere: Secondary | ICD-10-CM | POA: Insufficient documentation

## 2011-04-18 ENCOUNTER — Other Ambulatory Visit (HOSPITAL_COMMUNITY): Payer: Self-pay | Admitting: Oncology

## 2011-04-28 ENCOUNTER — Other Ambulatory Visit (HOSPITAL_COMMUNITY): Payer: Self-pay | Admitting: Oncology

## 2011-04-30 ENCOUNTER — Ambulatory Visit (HOSPITAL_COMMUNITY): Payer: Medicare Other

## 2011-05-03 ENCOUNTER — Other Ambulatory Visit (HOSPITAL_COMMUNITY): Payer: Self-pay | Admitting: Oncology

## 2011-05-03 ENCOUNTER — Encounter (HOSPITAL_COMMUNITY): Payer: Medicare Other | Attending: Oncology

## 2011-05-03 DIAGNOSIS — C9000 Multiple myeloma not having achieved remission: Secondary | ICD-10-CM | POA: Insufficient documentation

## 2011-05-03 DIAGNOSIS — E538 Deficiency of other specified B group vitamins: Secondary | ICD-10-CM

## 2011-05-03 DIAGNOSIS — D510 Vitamin B12 deficiency anemia due to intrinsic factor deficiency: Secondary | ICD-10-CM

## 2011-05-03 DIAGNOSIS — D638 Anemia in other chronic diseases classified elsewhere: Secondary | ICD-10-CM | POA: Insufficient documentation

## 2011-05-03 LAB — COMPREHENSIVE METABOLIC PANEL
ALT: 11 U/L (ref 0–35)
Alkaline Phosphatase: 40 U/L (ref 39–117)
BUN: 44 mg/dL — ABNORMAL HIGH (ref 6–23)
CO2: 27 mEq/L (ref 19–32)
GFR calc Af Amer: 35 mL/min — ABNORMAL LOW (ref >60)
GFR calc non Af Amer: 29 mL/min — ABNORMAL LOW (ref >60)
Glucose, Bld: 95 mg/dL (ref 70–99)
Potassium: 3.9 mEq/L (ref 3.5–5.1)
Sodium: 135 mEq/L (ref 135–145)

## 2011-05-03 LAB — CBC
HCT: 35.3 % — ABNORMAL LOW (ref 36.0–46.0)
Hemoglobin: 11.9 g/dL — ABNORMAL LOW (ref 12.0–15.0)
MCH: 33.2 pg (ref 26.0–34.0)
RBC: 3.58 MIL/uL — ABNORMAL LOW (ref 3.87–5.11)

## 2011-05-03 MED ORDER — HEPARIN SOD (PORK) LOCK FLUSH 100 UNIT/ML IV SOLN
INTRAVENOUS | Status: AC
  Administered 2011-05-03: 13:00:00
  Filled 2011-05-03: qty 5

## 2011-05-03 MED ORDER — ZOLEDRONIC ACID 4 MG/5ML IV CONC
2.0000 mg | Freq: Once | INTRAVENOUS | Status: AC
  Administered 2011-05-03: 2 mg via INTRAVENOUS
  Filled 2011-05-03: qty 2.5

## 2011-05-03 MED ORDER — SODIUM CHLORIDE 0.9 % IJ SOLN
INTRAMUSCULAR | Status: AC
  Administered 2011-05-03: 11:00:00
  Filled 2011-05-03: qty 10

## 2011-05-03 MED ORDER — CYANOCOBALAMIN 1000 MCG/ML IJ SOLN
INTRAMUSCULAR | Status: AC
  Filled 2011-05-03: qty 1

## 2011-05-03 MED ORDER — CYANOCOBALAMIN 1000 MCG/ML IJ SOLN
1000.0000 ug | Freq: Once | INTRAMUSCULAR | Status: AC
  Administered 2011-05-03: 1000 ug via INTRAMUSCULAR

## 2011-05-03 MED ORDER — SODIUM CHLORIDE 0.9 % IJ SOLN
INTRAMUSCULAR | Status: AC
  Administered 2011-05-03: 12:00:00
  Filled 2011-05-03: qty 10

## 2011-05-03 NOTE — Progress Notes (Signed)
Port accessed via protocol, good blood return, cbc/cmet/spep/ife/light chains drawn, flushed via protocol. Vitamin b12 given in lt deltoid. Tolerated well. Pharmacist Lorin Picket said it was ok to give Zometa with creat at 1.72. Zometa given.

## 2011-05-04 ENCOUNTER — Other Ambulatory Visit (HOSPITAL_COMMUNITY): Payer: Self-pay | Admitting: Oncology

## 2011-05-04 ENCOUNTER — Encounter (HOSPITAL_COMMUNITY): Payer: Self-pay | Admitting: *Deleted

## 2011-05-04 DIAGNOSIS — M545 Low back pain: Secondary | ICD-10-CM

## 2011-05-04 DIAGNOSIS — G8929 Other chronic pain: Secondary | ICD-10-CM

## 2011-05-04 MED ORDER — HYDROCODONE-ACETAMINOPHEN 10-325 MG PO TABS: 1.0000 | ORAL_TABLET | ORAL | Status: DC | PRN

## 2011-05-04 MED ORDER — METHADONE HCL 5 MG PO TABS: 10.0000 mg | ORAL_TABLET | Freq: Two times a day (BID) | ORAL | Status: AC

## 2011-05-07 LAB — KAPPA/LAMBDA LIGHT CHAINS: Kappa free light chain: 1.34 mg/dL (ref 0.33–1.94)

## 2011-05-15 ENCOUNTER — Encounter (HOSPITAL_COMMUNITY): Payer: Self-pay | Admitting: Oncology

## 2011-05-15 ENCOUNTER — Encounter (HOSPITAL_BASED_OUTPATIENT_CLINIC_OR_DEPARTMENT_OTHER): Payer: Medicare Other | Admitting: Oncology

## 2011-05-15 DIAGNOSIS — N289 Disorder of kidney and ureter, unspecified: Secondary | ICD-10-CM

## 2011-05-15 DIAGNOSIS — IMO0002 Reserved for concepts with insufficient information to code with codable children: Secondary | ICD-10-CM

## 2011-05-15 DIAGNOSIS — C9000 Multiple myeloma not having achieved remission: Secondary | ICD-10-CM

## 2011-05-15 NOTE — Progress Notes (Signed)
CC:   [name], M.D.  DIAGNOSES: 1. Multiple myeloma presenting with a poorly differentiated plasma     cell disorder with numerous mitotic figures and extremely small     amount of protein in her serum consistent with an IgA lambda.  She     did have free light chains in her serum and urine. 2. Obesity. 3. Chronic back pain due to severe degenerative joint disease, disk     disease and involvement by myeloma. 4. Mild renal sufficiency. 5. Severe deconditioning. 6. Velcade-induced weakness and fatigue with probable autonomic     neuropathy. 7. Probable adrenal insufficiency on prednisone now 5 mg maintenance     with marked improvement in her sense of well-being since we started     that.  She still continues [employer].  We are trying to do it monthly.  She has very low if any pain right now.  She is on methadone 10 mg b.i.d. and a p.r.n. pain medication but she hardly ever uses that now.  She is getting around with a walker better and better, especially since being on the prednisone and the methadone.  Her labs the other day still show no evidence of recurrent disease.  Her vital signs were also stable, except her weight is actually up much more than it should be, so I have asked her to lose some weight.  She has gained 11 pounds in a month.  I may try to reduce her prednisone to 4 mg if she continues to gain all this weight.  It is just a maintenance dose, obviously.  We will set her up for monthly labs.  We will see her back in 2 months.    ______________________________ Ladona Horns. Mariel Sleet, MD ESN/MEDQ  D:  05/15/2011  T:  05/15/2011  Job:  [id number]

## 2011-05-15 NOTE — Patient Instructions (Addendum)
[facility]  Discharge Instructions  RECOMMENDATIONS MADE BY THE CONSULTANT AND ANY TEST RESULTS WILL BE SENT TO YOUR REFERRING DOCTOR.   EXAM FINDINGS BY MD TODAY AND SIGNS AND SYMPTOMS TO REPORT TO CLINIC OR PRIMARY MD: continue meds as ordered, same schedule for labs and zometa        I acknowledge that I have been informed and understand all the instructions given to me and received a copy. I do not have any more questions at this time, but understand that I may call the Specialty Clinic at [facility] at [phone] during business hours should I have any further questions or need assistance in obtaining follow-up care.    __________________________________________  _____________  __________ Signature of Patient or Authorized Representative            Date                   Time    __________________________________________ Nurse's Signature

## 2011-05-31 ENCOUNTER — Encounter (HOSPITAL_COMMUNITY): Payer: Medicare Other | Attending: Oncology | Admitting: Oncology

## 2011-05-31 ENCOUNTER — Ambulatory Visit (HOSPITAL_COMMUNITY): Payer: Medicare Other

## 2011-05-31 DIAGNOSIS — C9000 Multiple myeloma not having achieved remission: Secondary | ICD-10-CM | POA: Insufficient documentation

## 2011-05-31 DIAGNOSIS — N289 Disorder of kidney and ureter, unspecified: Secondary | ICD-10-CM

## 2011-05-31 DIAGNOSIS — G4700 Insomnia, unspecified: Secondary | ICD-10-CM | POA: Insufficient documentation

## 2011-05-31 DIAGNOSIS — F419 Anxiety disorder, unspecified: Secondary | ICD-10-CM

## 2011-05-31 DIAGNOSIS — F411 Generalized anxiety disorder: Secondary | ICD-10-CM | POA: Insufficient documentation

## 2011-05-31 LAB — COMPREHENSIVE METABOLIC PANEL
ALT: 9 U/L (ref 0–35)
Alkaline Phosphatase: 40 U/L (ref 39–117)
GFR calc Af Amer: 30 mL/min — ABNORMAL LOW (ref >60)
Glucose, Bld: 105 mg/dL — ABNORMAL HIGH (ref 70–99)
Potassium: 4.2 mEq/L (ref 3.5–5.1)
Sodium: 139 mEq/L (ref 135–145)
Total Protein: 7.2 g/dL (ref 6.0–8.3)

## 2011-05-31 LAB — DIFFERENTIAL
Basophils Relative: 0 % (ref 0–1)
Eosinophils Absolute: 0 10*3/uL (ref 0.0–0.7)
Monocytes Absolute: 0.8 10*3/uL (ref 0.1–1.0)
Monocytes Relative: 8 % (ref 3–12)

## 2011-05-31 LAB — CBC
HCT: 31.6 % — ABNORMAL LOW (ref 36.0–46.0)
Hemoglobin: 10.4 g/dL — ABNORMAL LOW (ref 12.0–15.0)
MCH: 32.7 pg (ref 26.0–34.0)
MCHC: 32.9 g/dL (ref 30.0–36.0)
MCV: 99.4 fL (ref 78.0–100.0)

## 2011-05-31 MED ORDER — ZOLPIDEM TARTRATE 5 MG PO TABS: 5.0000 mg | ORAL_TABLET | Freq: Every day | ORAL | Status: DC

## 2011-05-31 MED ORDER — SPIRONOLACTONE 50 MG PO TABS: 50.0000 mg | ORAL_TABLET | ORAL | Status: DC

## 2011-05-31 MED ORDER — ALPRAZOLAM 0.5 MG PO TABS: 0.5000 mg | ORAL_TABLET | Freq: Three times a day (TID) | ORAL | Status: DC | PRN

## 2011-05-31 MED ORDER — OMEPRAZOLE 20 MG PO CPDR: 20.0000 mg | DELAYED_RELEASE_CAPSULE | Freq: Every day | ORAL | Status: DC

## 2011-05-31 NOTE — Patient Instructions (Signed)
[facility]  Discharge Instructions  RECOMMENDATIONS MADE BY THE CONSULTANT AND ANY TEST RESULTS WILL BE SENT TO YOUR REFERRING DOCTOR.   EXAM FINDINGS BY MD TODAY AND SIGNS AND SYMPTOMS TO REPORT TO CLINIC OR PRIMARY MD: Exam was good.    MEDICATIONS PRESCRIBED: Refill on Ambien given (4 refills) Refill Spironolactone 25mg  MWF given through the computer system (2 refills) Refill on Prilosec given through the computer system (4 refills given) Xanax 0.5mg  by mouth every 6 hours as needed for anxiety/nervousness #75 tablets (2 refills)    INSTRUCTIONS GIVEN AND DISCUSSED: CBC diff, CMET, multiple myeloma panel today. Zometa due 06/04/11 at 11:45. You are already scheduled to see Dr. Mariel Sleet on 07/16/11 at 11:00.    I acknowledge that I have been informed and understand all the instructions given to me and received a copy. I do not have any more questions at this time, but understand that I may call the Specialty Clinic at [facility] at [phone] during business hours should I have any further questions or need assistance in obtaining follow-up care.    __________________________________________  _____________  __________ Signature of Patient or Authorized Representative            Date                   Time    __________________________________________ Nurse's Signature

## 2011-05-31 NOTE — Progress Notes (Signed)
[name], MD [address] [place] [address]  1. Multiple myeloma  CBC, Differential, Comprehensive metabolic panel, Multiple myeloma panel, serum, Kappa/lambda light chains, Comprehensive metabolic panel, CBC, CBC, Comprehensive metabolic panel, Differential, Differential, Kappa/lambda light chains, Kappa/lambda light chains, Multiple myeloma panel, serum, Multiple myeloma panel, serum  2. Anxiety  ALPRAZolam (XANAX) 0.5 MG tablet, DISCONTINUED: ALPRAZolam (XANAX) 0.5 MG tablet  3. Insomnia  zolpidem (AMBIEN) 5 MG tablet    CURRENT THERAPY: Non presently  INTERVAL HISTORY: [name] 77 y.o. female returns for  regular  visit for followup of multiple myeloma, poorly differentiated plasma cell disorder with numerous mitotic figures and extremely small amount of protein in her serum consistent with an IgA lambda. She did have free light chains in her serum and urine.  The patient is happy to see her hair growing back.  She now has grey hair that is curly and the patient enjoys this hair style.  She reports that her hair is coming in curly on its own.  The patient was seen walking into the clinic.  The patient reports that her pain regimen helps with her pain, but it does not resolve.  It makes her pain more manageable.  She requests refills on a number of her medications: Prilosec, Spironolactone, Xanax, and Ambien.  These were filled for her.  She looks great and she feels wonderful.  She reports that her appetite is "too good."  She does admit that she averages approximately 75% of the daylight hours in a recliner or on the couch.  She explains that she has good days and bad days.  She is able to do some simple cooking and household chores.  Her son and daughter help her at home.  The patient lives at home by herself and seems to be doing quite well.   Past Medical History  Diagnosis Date  . Hypertension     LVH; RVH; normal EF in 04/2011 with the  basilar inferior wall akinesis  . Edema   . Obesity   . Osteoporosis     Lumbar and thoracic compression fractures  . Multiple myeloma 2007    IgA lambda  . Gastroesophageal reflux disease   . Pernicious anemia     plus thrombocytopenia;H./H.-9.8/29 in 12/2010  . Chronic kidney disease, stage 2, mildly decreased GFR     Creatinine of 1.3 in 09/2010; acute renal failure in 11/2010 secondary to diuretics with hyperkalemia secondary to Spiriva lactone  . Peripheral neuropathy 2012    Complicating chemotherapy  . Chronic low back pain   . Chronic steroid use   . History of candidiasis   . Cancer     multiple myeloma  . Allergy     revlimid  . Anemia of chronic disease 04/14/2011  . B12 deficiency 04/14/2011    has HYPERTENSION, UNSPECIFIED; EDEMA; Obesity; Osteoporosis; Multiple myeloma; Gastroesophageal reflux disease; Pernicious anemia; Chronic kidney disease, stage 2, mildly decreased GFR; Peripheral neuropathy; Chronic low back pain; Chronic steroid use; Anemia of chronic disease; and B12 deficiency on her problem list.     is allergic to revlimid.  Ms. Eberwein had no medications administered during this visit.  Past Surgical History  Procedure Date  . Total abdominal hysterectomy w/ bilateral salpingoophorectomy     incidental appendectomy  . Rotator cuff repair     Left  . Vein surgery     Ligation and stripping bilaterally  . Orif ankle fracture     Right  .  Carpal tunnel release     Right  . Central venous catheter tunneled insertion single lumen 2012     denies any headaches, dizziness, double vision, fevers, chills, night sweats, nausea, vomiting, diarrhea, constipation, chest pain, heart palpitations, shortness of breath, blood in stool, black tarry stool, urinary pain, urinary burning, urinary frequency, hematuria.   PHYSICAL EXAMINATION  ECOG PERFORMANCE STATUS: 3 - Symptomatic, >50% confined to bed  Filed Vitals:   05/31/11 1116  BP: 136/85  Pulse: 74    Temp: 98.4 F (36.9 C)    GENERAL:alert, no distress, well nourished, well developed, comfortable, cooperative and smiling SKIN: skin color, texture, turgor are normal, no rashes or significant lesions HEAD: Normocephalic, No masses, lesions, tenderness or abnormalities EYES: normal, PERRLA, EOMI EARS: External ears normal OROPHARYNX:mucous membranes are moist  NECK: supple, no adenopathy, no JVD, thyroid normal size, non-tender, without nodularity, no stridor, non-tender, trachea midline LYMPH:  no palpable lymphadenopathy BREAST:not examined LUNGS: clear to auscultation and percussion HEART: regular rate & rhythm, no murmurs, no gallops, S1 normal and S2 normal ABDOMEN:abdomen soft, non-tender and normal bowel sounds BACK: Back symmetric, no curvature., No CVA tenderness EXTREMITIES:less then 2 second capillary refill, no joint deformities, effusion, or inflammation, no skin discoloration, no clubbing, no cyanosis, positive findings:  edema trace pitting pedal edema  NEURO: alert & oriented x 3 with fluent speech, no focal motor/sensory deficits, gait normal    LABORATORY DATA: CBC    Component Value Date/Time   WBC 9.7 05/03/2011 1110   RBC 3.58* 05/03/2011 1110   HGB 11.9* 05/03/2011 1110   HCT 35.3* 05/03/2011 1110   PLT 134* 05/03/2011 1110   MCV 98.6 05/03/2011 1110   MCH 33.2 05/03/2011 1110   MCHC 33.7 05/03/2011 1110   RDW 14.2 05/03/2011 1110   LYMPHSABS 0.4* 04/02/2011 1006   MONOABS 0.8 04/02/2011 1006   EOSABS 0.0 04/02/2011 1006   BASOSABS 0.0 04/02/2011 1006      Chemistry      Component Value Date/Time   NA 135 05/03/2011 1110   K 3.9 05/03/2011 1110   CL 99 05/03/2011 1110   CO2 27 05/03/2011 1110   BUN 44* 05/03/2011 1110   CREATININE 1.72* 05/03/2011 1110      Component Value Date/Time   CALCIUM 10.0 05/03/2011 1110   ALKPHOS 40 05/03/2011 1110   AST 15 05/03/2011 1110   ALT 11 05/03/2011 1110   BILITOT 0.3 05/03/2011 1110        PENDING LABS: CBC diff,  CMET, MM panel    ASSESSMENT:  1. Multiple myeloma presenting with a poorly differentiated plasma cell disorder with numerous mitotic figures and extremely small amount of protein in her serum consistent with an IgA lambda. She did have free light chains in her serum and urine.  2. Obesity.  3. Chronic back pain due to severe degenerative joint disease, disk disease and involvement by myeloma.  4. Mild renal sufficiency.  5. Severe deconditioning.  6. Velcade-induced weakness and fatigue with probable autonomic neuropathy.  7. Probable adrenal insufficiency on prednisone now 5 mg maintenance with marked improvement in her sense of well-being since we started that.    PLAN:  1. Refill of Xanax, Ambien, Spironolactone, and Prilosec given 2. Lab work today: CBC diff, CMET, MM panel 3. Zometa infusion on Monday 4. Return appointment with Dr. Mariel Sleet scheduled for 07/16/11   All questions were answered. The patient knows to call the clinic with any problems, questions or concerns. We can certainly  see the patient much sooner if necessary.  I spent 25 minutes counseling the patient face to face. The total time spent in the appointment was 30 minutes.  KEFALAS,THOMAS

## 2011-06-04 ENCOUNTER — Encounter (HOSPITAL_BASED_OUTPATIENT_CLINIC_OR_DEPARTMENT_OTHER): Payer: Medicare Other

## 2011-06-04 DIAGNOSIS — C9000 Multiple myeloma not having achieved remission: Secondary | ICD-10-CM

## 2011-06-04 DIAGNOSIS — E538 Deficiency of other specified B group vitamins: Secondary | ICD-10-CM

## 2011-06-04 MED ORDER — CYANOCOBALAMIN 1000 MCG/ML IJ SOLN
INTRAMUSCULAR | Status: AC
  Filled 2011-06-04: qty 1

## 2011-06-04 MED ORDER — CYANOCOBALAMIN 1000 MCG/ML IJ SOLN
1000.0000 ug | Freq: Once | INTRAMUSCULAR | Status: AC
  Administered 2011-06-04: 1000 ug via INTRAMUSCULAR

## 2011-06-04 MED ORDER — SODIUM CHLORIDE 0.9 % IJ SOLN
INTRAMUSCULAR | Status: AC
  Administered 2011-06-04: 01:00:00
  Filled 2011-06-04: qty 10

## 2011-06-04 MED ORDER — ZOLEDRONIC ACID 4 MG/5ML IV CONC
2.0000 mg | Freq: Once | INTRAVENOUS | Status: AC
  Administered 2011-06-04: 2 mg via INTRAVENOUS
  Filled 2011-06-04: qty 2.5

## 2011-06-04 MED ORDER — HEPARIN SOD (PORK) LOCK FLUSH 100 UNIT/ML IV SOLN
INTRAVENOUS | Status: AC
  Administered 2011-06-04: 500 [IU]
  Filled 2011-06-04: qty 5

## 2011-06-04 NOTE — Progress Notes (Signed)
Tried to scan barcode, but it wouldn't scan for the normal saline and heparin flush

## 2011-06-14 LAB — MULTIPLE MYELOMA PANEL, SERUM
Albumin ELP: 56.7 % (ref 55.8–66.1)
Alpha-1-Globulin: 5.4 % — ABNORMAL HIGH (ref 2.9–4.9)
Alpha-2-Globulin: 15.2 % — ABNORMAL HIGH (ref 7.1–11.8)
Beta 2: 5.6 % (ref 3.2–6.5)
Gamma Globulin: 11.4 % (ref 11.1–18.8)
IgG (Immunoglobin G), Serum: 881 mg/dL (ref 690–1700)
IgM, Serum: 98 mg/dL (ref 52–322)

## 2011-07-02 ENCOUNTER — Encounter (HOSPITAL_COMMUNITY): Payer: Medicare Other | Attending: Oncology

## 2011-07-02 ENCOUNTER — Encounter (HOSPITAL_COMMUNITY): Payer: Medicare Other

## 2011-07-02 DIAGNOSIS — M545 Low back pain, unspecified: Secondary | ICD-10-CM | POA: Insufficient documentation

## 2011-07-02 DIAGNOSIS — E538 Deficiency of other specified B group vitamins: Secondary | ICD-10-CM

## 2011-07-02 DIAGNOSIS — D510 Vitamin B12 deficiency anemia due to intrinsic factor deficiency: Secondary | ICD-10-CM | POA: Insufficient documentation

## 2011-07-02 DIAGNOSIS — E669 Obesity, unspecified: Secondary | ICD-10-CM | POA: Insufficient documentation

## 2011-07-02 DIAGNOSIS — D638 Anemia in other chronic diseases classified elsewhere: Secondary | ICD-10-CM | POA: Insufficient documentation

## 2011-07-02 DIAGNOSIS — G8929 Other chronic pain: Secondary | ICD-10-CM

## 2011-07-02 DIAGNOSIS — M810 Age-related osteoporosis without current pathological fracture: Secondary | ICD-10-CM | POA: Insufficient documentation

## 2011-07-02 DIAGNOSIS — C9000 Multiple myeloma not having achieved remission: Secondary | ICD-10-CM

## 2011-07-02 LAB — CBC
HCT: 26.9 % — ABNORMAL LOW (ref 36.0–46.0)
MCV: 101.1 fL — ABNORMAL HIGH (ref 78.0–100.0)
RBC: 2.66 MIL/uL — ABNORMAL LOW (ref 3.87–5.11)
WBC: 7.8 10*3/uL (ref 4.0–10.5)

## 2011-07-02 LAB — DIFFERENTIAL
Eosinophils Relative: 1 % (ref 0–5)
Lymphocytes Relative: 8 % — ABNORMAL LOW (ref 12–46)
Lymphs Abs: 0.6 10*3/uL — ABNORMAL LOW (ref 0.7–4.0)
Monocytes Absolute: 0.9 10*3/uL (ref 0.1–1.0)

## 2011-07-02 LAB — COMPREHENSIVE METABOLIC PANEL
BUN: 37 mg/dL — ABNORMAL HIGH (ref 6–23)
CO2: 22 mEq/L (ref 19–32)
Chloride: 105 mEq/L (ref 96–112)
Creatinine, Ser: 1.66 mg/dL — ABNORMAL HIGH (ref 0.50–1.10)
GFR calc non Af Amer: 30 mL/min — ABNORMAL LOW (ref >60)
Total Bilirubin: 0.4 mg/dL (ref 0.3–1.2)

## 2011-07-02 MED ORDER — EPOETIN ALFA 40000 UNIT/ML IJ SOLN
INTRAMUSCULAR | Status: AC
  Administered 2011-07-02: 40000 [IU] via SUBCUTANEOUS
  Filled 2011-07-02: qty 1

## 2011-07-02 MED ORDER — HEPARIN SOD (PORK) LOCK FLUSH 100 UNIT/ML IV SOLN
INTRAVENOUS | Status: AC
  Administered 2011-07-02: 500 [IU]
  Filled 2011-07-02: qty 5

## 2011-07-02 MED ORDER — CYANOCOBALAMIN 1000 MCG/ML IJ SOLN
INTRAMUSCULAR | Status: AC
  Administered 2011-07-02: 1000 ug via INTRAMUSCULAR
  Filled 2011-07-02: qty 1

## 2011-07-02 MED ORDER — SODIUM CHLORIDE 0.9 % IV SOLN
2.0000 mg | Freq: Once | INTRAVENOUS | Status: AC
  Administered 2011-07-02: 2 mg via INTRAVENOUS
  Filled 2011-07-02: qty 2.5

## 2011-07-02 MED ORDER — CYANOCOBALAMIN 1000 MCG/ML IJ SOLN: 1000.0000 ug | Freq: Once | INTRAMUSCULAR | Status: DC

## 2011-07-02 MED ORDER — SODIUM CHLORIDE 0.9 % IV SOLN
INTRAVENOUS | Status: DC
  Administered 2011-07-02: 11:00:00 via INTRAVENOUS

## 2011-07-02 MED ORDER — OXYCODONE-ACETAMINOPHEN 10-325 MG PO TABS: ORAL_TABLET | ORAL | Status: DC

## 2011-07-02 MED ORDER — EPOETIN ALFA 40000 UNIT/ML IJ SOLN: 40000.0000 [IU] | Freq: Once | INTRAMUSCULAR | Status: DC

## 2011-07-03 LAB — KAPPA/LAMBDA LIGHT CHAINS: Lambda free light chains: 1.72 mg/dL (ref 0.57–2.63)

## 2011-07-04 ENCOUNTER — Other Ambulatory Visit (HOSPITAL_COMMUNITY): Payer: Self-pay | Admitting: Oncology

## 2011-07-04 ENCOUNTER — Telehealth (HOSPITAL_COMMUNITY): Payer: Self-pay | Admitting: Oncology

## 2011-07-04 DIAGNOSIS — G8929 Other chronic pain: Secondary | ICD-10-CM

## 2011-07-04 DIAGNOSIS — M545 Low back pain: Secondary | ICD-10-CM

## 2011-07-04 MED ORDER — METHADONE HCL 5 MG PO TABS: 10.0000 mg | ORAL_TABLET | Freq: Two times a day (BID) | ORAL | Status: DC

## 2011-07-05 LAB — MULTIPLE MYELOMA PANEL, SERUM
Alpha-2-Globulin: 14.3 % — ABNORMAL HIGH (ref 7.1–11.8)
Beta Globulin: 5.8 % (ref 4.7–7.2)
Gamma Globulin: 10.3 % — ABNORMAL LOW (ref 11.1–18.8)
IgA: 158 mg/dL (ref 69–380)
M-Spike, %: NOT DETECTED g/dL

## 2011-07-09 MED ORDER — SODIUM CHLORIDE 0.9 % IJ SOLN
INTRAMUSCULAR | Status: AC
  Filled 2011-07-09: qty 20

## 2011-07-12 LAB — DIFFERENTIAL
Basophils Absolute: 0
Basophils Absolute: 0
Basophils Relative: 0
Eosinophils Absolute: 0
Eosinophils Absolute: 0.1
Lymphs Abs: 0.1 — ABNORMAL LOW
Monocytes Relative: 8
Neutrophils Relative %: 69
Neutrophils Relative %: 84 — ABNORMAL HIGH

## 2011-07-12 LAB — COMPREHENSIVE METABOLIC PANEL
ALT: 12
Alkaline Phosphatase: 37 — ABNORMAL LOW
CO2: 19
Chloride: 110
Glucose, Bld: 104 — ABNORMAL HIGH
Potassium: 5.6 — ABNORMAL HIGH
Sodium: 134 — ABNORMAL LOW
Total Bilirubin: 0.4
Total Protein: 5.8 — ABNORMAL LOW

## 2011-07-12 LAB — CBC
HCT: 28.4 — ABNORMAL LOW
Hemoglobin: 9.5 — ABNORMAL LOW
MCV: 100.1 — ABNORMAL HIGH
Platelets: 115 — ABNORMAL LOW
RBC: 2.84 — ABNORMAL LOW
RDW: 16 — ABNORMAL HIGH
RDW: 16.2 — ABNORMAL HIGH
WBC: 1.5 — ABNORMAL LOW
WBC: 6.2

## 2011-07-12 LAB — BASIC METABOLIC PANEL
BUN: 40 — ABNORMAL HIGH
Chloride: 106
Creatinine, Ser: 1.57 — ABNORMAL HIGH
GFR calc non Af Amer: 32 — ABNORMAL LOW
Glucose, Bld: 159 — ABNORMAL HIGH

## 2011-07-12 LAB — IMMUNOFIXATION ELECTROPHORESIS
IgA: 107
IgM, Serum: 48 — ABNORMAL LOW

## 2011-07-12 LAB — IGG, IGA, IGM: IgG (Immunoglobin G), Serum: 683 — ABNORMAL LOW

## 2011-07-12 LAB — PROTEIN ELECTROPH W RFLX QUANT IMMUNOGLOBULINS
Alpha-1-Globulin: 6.5 — ABNORMAL HIGH
Alpha-2-Globulin: 15 — ABNORMAL HIGH
Gamma Globulin: 11.9
Total Protein ELP: 5.9 — ABNORMAL LOW

## 2011-07-13 LAB — CBC
RBC: 2.79 — ABNORMAL LOW
WBC: 4.2

## 2011-07-13 LAB — PROTEIN ELECTROPH W RFLX QUANT IMMUNOGLOBULINS
Beta Globulin: 5.8
Gamma Globulin: 13.1
M-Spike, %: NOT DETECTED
Total Protein ELP: 6.1

## 2011-07-13 LAB — COMPREHENSIVE METABOLIC PANEL
ALT: 14
AST: 10
CO2: 16 — ABNORMAL LOW
Chloride: 109
GFR calc Af Amer: 40 — ABNORMAL LOW
GFR calc non Af Amer: 33 — ABNORMAL LOW
Sodium: 132 — ABNORMAL LOW
Total Bilirubin: 0.5

## 2011-07-13 LAB — DIFFERENTIAL
Basophils Absolute: 0
Eosinophils Absolute: 0.1
Eosinophils Relative: 2
Lymphs Abs: 0.5 — ABNORMAL LOW

## 2011-07-13 LAB — IGG, IGA, IGM
IgA: 251
IgM, Serum: 57 — ABNORMAL LOW

## 2011-07-13 LAB — IMMUNOFIXATION ELECTROPHORESIS: Total Protein ELP: 6.1

## 2011-07-16 ENCOUNTER — Ambulatory Visit (HOSPITAL_COMMUNITY)
Admission: RE | Admit: 2011-07-16 | Discharge: 2011-07-16 | Disposition: A | Discharge status: Home / Self Care | Payer: Medicare Other | Source: Ambulatory Visit | Attending: Oncology | Admitting: Oncology

## 2011-07-16 ENCOUNTER — Other Ambulatory Visit (HOSPITAL_COMMUNITY): Payer: Self-pay | Admitting: Oncology

## 2011-07-16 ENCOUNTER — Encounter (HOSPITAL_BASED_OUTPATIENT_CLINIC_OR_DEPARTMENT_OTHER): Payer: Medicare Other | Admitting: Oncology

## 2011-07-16 ENCOUNTER — Encounter (HOSPITAL_BASED_OUTPATIENT_CLINIC_OR_DEPARTMENT_OTHER): Payer: Medicare Other

## 2011-07-16 ENCOUNTER — Telehealth (HOSPITAL_COMMUNITY): Payer: Self-pay

## 2011-07-16 DIAGNOSIS — E538 Deficiency of other specified B group vitamins: Secondary | ICD-10-CM

## 2011-07-16 DIAGNOSIS — M899 Disorder of bone, unspecified: Secondary | ICD-10-CM | POA: Insufficient documentation

## 2011-07-16 DIAGNOSIS — C9000 Multiple myeloma not having achieved remission: Secondary | ICD-10-CM | POA: Insufficient documentation

## 2011-07-16 DIAGNOSIS — D510 Vitamin B12 deficiency anemia due to intrinsic factor deficiency: Secondary | ICD-10-CM

## 2011-07-16 DIAGNOSIS — M25559 Pain in unspecified hip: Secondary | ICD-10-CM | POA: Insufficient documentation

## 2011-07-16 DIAGNOSIS — D638 Anemia in other chronic diseases classified elsewhere: Secondary | ICD-10-CM

## 2011-07-16 DIAGNOSIS — E669 Obesity, unspecified: Secondary | ICD-10-CM

## 2011-07-16 MED ORDER — EPOETIN ALFA 40000 UNIT/ML IJ SOLN
40000.0000 [IU] | Freq: Once | INTRAMUSCULAR | Status: AC
  Administered 2011-07-16: 40000 [IU] via SUBCUTANEOUS

## 2011-07-16 MED ORDER — EPOETIN ALFA 40000 UNIT/ML IJ SOLN
INTRAMUSCULAR | Status: AC
  Administered 2011-07-16: 40000 [IU] via SUBCUTANEOUS
  Filled 2011-07-16: qty 1

## 2011-07-16 NOTE — Progress Notes (Signed)
This office note has been dictated.

## 2011-07-16 NOTE — Telephone Encounter (Signed)
Notes Recorded by Dellis Anes, PA on 07/16/2011 at 2:09 PM Let patient know that Hip X-ray was normal and did not show any bony abnormality  [id number] patient notified.Catalina Gravel

## 2011-07-16 NOTE — Progress Notes (Signed)
CC:   [name], M.D. [name], M.D.  DIAGNOSIS:  IgA lambda multiple myeloma presented with extensive disease in November 2007 but only a faint spike.  She did have some lambda light chains in her urine.  She had a scalp mass at presentation that turned out to be poorly differentiated plasma cell myeloma with Bence Jones proteinuria as I mentioned.  Her Bence Jones proteinuria and M-spike basically disappear with therapy.  She still has a few lambda light chains minimally present in her serum.  There is no change the last several months.  She was initially treated with thalidomide and Decadron and melphalan was added to which she responded beautifully.  We then maintained her on thalidomide, which was discontinued due to toxicity. We then tried Revlimid; that also had to be discontinued due to toxicity.  Most recently she has been observed.  We did initiate Velcade therapy in December 2011.  Prior to that, we had given her Decadron, epirubicin and Cytoxan.  Her medications presently are basically pain medications including Percocet and methadone.  She takes some Lasix for fluid along with some omeprazole for her stomach.  She is still on Zovirax 200 mg b.i.d.  Her labs the other day were excellent.  She is also still on Procrit for her anemia of chronic disease.  Her most recent problems have revolved around her back, degenerative disk disease, degenerative arthritic disease, myelomatous involvement, etc.  She has a back which no surgeon really wants to touch.  She has intermittent back pain and still intermittent left hip pain and is getting an x-ray of the left hip today even though a couple weeks ago it was terrifically bad, she is much better now.  She is still walking with a walker.  She is accompanied by her granddaughter today.  She still cooks for herself interestingly but cannot go shopping, cannot get out really a lot.  Weight is down a few pounds in  the last 8 weeks and she is trying to do better with that.  Her bowels are working fairly well.  Her vital signs today are realistically stable.  I did not examine other than look at her scalp, which is still very retracted, still this hollowed out area of the skull on the right frontal area which is where she presented.  She has an intact port in the right upper chest wall.  She has no ankle edema presently.  So she is doing far far better than she was a few months ago.  We will continue monthly blood work and I will see her in 12 weeks.    ______________________________ Ladona Horns. Mariel Sleet, MD ESN/MEDQ  D:  07/16/2011  T:  07/16/2011  Job:  [id number]

## 2011-07-16 NOTE — Patient Instructions (Signed)
[facility]  Discharge Instructions  RECOMMENDATIONS MADE BY THE CONSULTANT AND ANY TEST RESULTS WILL BE SENT TO YOUR REFERRING DOCTOR.   EXAM FINDINGS BY MD TODAY AND SIGNS AND SYMPTOMS TO REPORT TO CLINIC OR PRIMARY MD: Doing well.  Will not make any changes.  MEDICATIONS PRESCRIBED: none   INSTRUCTIONS GIVEN AND DISCUSSED: Other :  Report uncontrolled pain.  SPECIAL INSTRUCTIONS/FOLLOW-UP: Lab work Needed every 4 weeks from 9/10, procrit every 2 weeks, Vitamin B12 injection every 4 weeks & Zometa every 4 weeks.   I acknowledge that I have been informed and understand all the instructions given to me and received a copy. I do not have any more questions at this time, but understand that I may call the Specialty Clinic at [facility] at [phone] during business hours should I have any further questions or need assistance in obtaining follow-up care.    __________________________________________  _____________  __________ Signature of Patient or Authorized Representative            Date                   Time    __________________________________________ Nurse's Signature

## 2011-07-16 NOTE — Progress Notes (Unsigned)
[name] presents today for injection per MD orders. Procrit 40,000 units administered SQ in right Abdomen. Administration without incident. Patient tolerated well.

## 2011-07-17 LAB — DIFFERENTIAL
Basophils Absolute: 0
Basophils Absolute: 0
Basophils Relative: 0
Basophils Relative: 1
Eosinophils Absolute: 0.1
Eosinophils Absolute: 0.3
Eosinophils Relative: 2
Monocytes Absolute: 0.1
Monocytes Absolute: 0.4
Monocytes Relative: 3
Monocytes Relative: 22 — ABNORMAL HIGH
Neutrophils Relative %: 47

## 2011-07-17 LAB — COMPREHENSIVE METABOLIC PANEL
ALT: 16
AST: 11
Albumin: 3.3 — ABNORMAL LOW
Alkaline Phosphatase: 48
BUN: 44 — ABNORMAL HIGH
Chloride: 105
GFR calc Af Amer: 37 — ABNORMAL LOW
Potassium: 4.6
Sodium: 131 — ABNORMAL LOW
Total Bilirubin: 0.4

## 2011-07-17 LAB — CROSSMATCH: ABO/RH(D): A POS

## 2011-07-17 LAB — ABO/RH: ABO/RH(D): A POS

## 2011-07-17 LAB — METHYLMALONIC ACID, SERUM: Methylmalonic Acid, Quantitative: 818 nmol/L — ABNORMAL HIGH (ref 87–318)

## 2011-07-17 LAB — CBC
HCT: 17.3 — ABNORMAL LOW
HCT: 26.2 — ABNORMAL LOW
Hemoglobin: 9 — ABNORMAL LOW
MCHC: 34.8
MCHC: 35.2
MCV: 94.8
MCV: 96.4
Platelets: 86 — ABNORMAL LOW
Platelets: 71 — ABNORMAL LOW
RBC: 2.68 — ABNORMAL LOW
RDW: 18.9 — ABNORMAL HIGH
WBC: 3.6 — ABNORMAL LOW
WBC: 3 — ABNORMAL LOW

## 2011-07-17 LAB — PROTEIN ELECTROPH W RFLX QUANT IMMUNOGLOBULINS
Albumin ELP: 54.8 — ABNORMAL LOW
Beta 2: 4.5
Beta Globulin: 6.5
Gamma Globulin: 9.9 — ABNORMAL LOW
M-Spike, %: NOT DETECTED

## 2011-07-17 LAB — FOLATE: Folate: 5.7

## 2011-07-17 LAB — RETICULOCYTES
RBC.: 2.68 — ABNORMAL LOW
Retic Count, Absolute: 40.2

## 2011-07-17 LAB — HEMOGLOBIN AND HEMATOCRIT, BLOOD
HCT: 20.5 — ABNORMAL LOW
Hemoglobin: 7.3 — CL

## 2011-07-17 LAB — OCCULT BLOOD (STOOL CUP TO LAB): Fecal Occult Bld: NEGATIVE

## 2011-07-18 LAB — COMPREHENSIVE METABOLIC PANEL
ALT: 13
AST: 12
Albumin: 3.5
Alkaline Phosphatase: 45
GFR calc Af Amer: >60
Potassium: 3.9
Sodium: 131 — ABNORMAL LOW
Total Protein: 5.8 — ABNORMAL LOW

## 2011-07-18 LAB — DIFFERENTIAL
Basophils Relative: 0
Basophils Relative: 1
Eosinophils Relative: 4
Lymphocytes Relative: 15
Monocytes Absolute: 0.7
Monocytes Relative: 14 — ABNORMAL HIGH
Monocytes Relative: 24 — ABNORMAL HIGH
Neutro Abs: 1.8
Neutro Abs: 3.7

## 2011-07-18 LAB — CBC
Hemoglobin: 9 — ABNORMAL LOW
MCHC: 35.4
Platelets: 93 — ABNORMAL LOW
RBC: 2.66 — ABNORMAL LOW
RDW: 18.9 — ABNORMAL HIGH
WBC: 4.9

## 2011-07-18 LAB — PROTEIN ELECTROPHORESIS, SERUM
Alpha-2-Globulin: 14.5 — ABNORMAL HIGH
M-Spike, %: NOT DETECTED
Total Protein ELP: 6.1

## 2011-07-18 LAB — IMMUNOFIXATION ELECTROPHORESIS: IgA: 92

## 2011-07-19 LAB — DIFFERENTIAL
Eosinophils Absolute: 0
Lymphs Abs: 0.6 — ABNORMAL LOW
Monocytes Absolute: 0.5
Monocytes Relative: 11
Neutro Abs: 3.7
Neutrophils Relative %: 77
Smear Review: DECREASED

## 2011-07-19 LAB — PROTEIN ELECTROPHORESIS, SERUM
Beta 2: 3.4
Beta Globulin: 6.6
Gamma Globulin: 8.6 — ABNORMAL LOW
M-Spike, %: NOT DETECTED

## 2011-07-19 LAB — COMPREHENSIVE METABOLIC PANEL
ALT: 14
Albumin: 3.8
Alkaline Phosphatase: 40
Calcium: 9.3
Glucose, Bld: 108 — ABNORMAL HIGH
Potassium: 4.3
Sodium: 130 — ABNORMAL LOW
Total Protein: 6.2

## 2011-07-19 LAB — CBC
HCT: 24.5 — ABNORMAL LOW
Hemoglobin: 8.7 — ABNORMAL LOW
Hemoglobin: 8 — ABNORMAL LOW
MCHC: 35.7
MCHC: 33.9
MCV: 95.5
Platelets: 73 — ABNORMAL LOW
RDW: 20.7 — ABNORMAL HIGH
RDW: 21.8 — ABNORMAL HIGH

## 2011-07-19 LAB — IMMUNOFIXATION ELECTROPHORESIS
IgA: 77
IgM, Serum: 69
Total Protein ELP: 6.2

## 2011-07-20 LAB — PROTEIN ELECTROPH W RFLX QUANT IMMUNOGLOBULINS
Alpha-2-Globulin: 13.8 — ABNORMAL HIGH
Beta Globulin: 6.2
Gamma Globulin: 8.4 — ABNORMAL LOW
M-Spike, %: NOT DETECTED

## 2011-07-20 LAB — COMPREHENSIVE METABOLIC PANEL
ALT: 14
AST: 13
Albumin: 3.6
Alkaline Phosphatase: 46
BUN: 23
Chloride: 107
GFR calc Af Amer: 42 — ABNORMAL LOW
Potassium: 4.7
Sodium: 136
Total Protein: 5.8 — ABNORMAL LOW

## 2011-07-20 LAB — UIFE/LIGHT CHAINS/TP QN, 24-HR UR
Alpha 1, Urine: DETECTED — AB
Alpha 2, Urine: DETECTED — AB
Beta, Urine: DETECTED — AB
Free Kappa Lt Chains,Ur: 1.17 (ref 0.04–1.51)
Free Kappa/Lambda Ratio: 19.5 — ABNORMAL HIGH (ref 0.46–4.00)
Volume, Urine: 3250

## 2011-07-20 LAB — DIFFERENTIAL
Basophils Relative: 0
Eosinophils Relative: 3
Monocytes Absolute: 0.4
Monocytes Relative: 10
Neutro Abs: 3.2

## 2011-07-20 LAB — CBC
Platelets: 91 — ABNORMAL LOW
RDW: 19.3 — ABNORMAL HIGH
WBC: 3.9 — ABNORMAL LOW

## 2011-07-20 LAB — IMMUNOFIXATION ELECTROPHORESIS
IgA: 73
IgG (Immunoglobin G), Serum: 451 — ABNORMAL LOW

## 2011-07-20 LAB — CREATININE CLEARANCE, URINE, 24 HOUR: Creatinine Clearance: 50 — ABNORMAL LOW

## 2011-07-23 ENCOUNTER — Ambulatory Visit (HOSPITAL_COMMUNITY): Payer: Medicare Other

## 2011-07-23 LAB — CROSSMATCH

## 2011-07-23 LAB — DIFFERENTIAL
Basophils Relative: 0
Basophils Relative: 0
Eosinophils Absolute: 0
Eosinophils Relative: 8 — ABNORMAL HIGH
Lymphs Abs: 0.2 — ABNORMAL LOW
Monocytes Absolute: 0.3
Monocytes Relative: 20 — ABNORMAL HIGH
Neutro Abs: 0.9 — ABNORMAL LOW
Neutrophils Relative %: 70
WBC Morphology: INCREASED

## 2011-07-23 LAB — IMMUNOFIXATION ELECTROPHORESIS: IgG (Immunoglobin G), Serum: 471 — ABNORMAL LOW

## 2011-07-23 LAB — PROTEIN ELECTROPH W RFLX QUANT IMMUNOGLOBULINS
Albumin ELP: 62.7
Alpha-1-Globulin: 5.7 — ABNORMAL HIGH
Alpha-2-Globulin: 13.7 — ABNORMAL HIGH
Gamma Globulin: 7.6 — ABNORMAL LOW

## 2011-07-23 LAB — CBC
HCT: 22.3 — ABNORMAL LOW
Hemoglobin: 8.4 — ABNORMAL LOW
Hemoglobin: 7.5 — CL
MCHC: 33.8
MCV: 102.9 — ABNORMAL HIGH
RBC: 2.36 — ABNORMAL LOW
RBC: 2.17 — ABNORMAL LOW

## 2011-07-23 LAB — COMPREHENSIVE METABOLIC PANEL
ALT: 15
Alkaline Phosphatase: 44
CO2: 21
GFR calc non Af Amer: 41 — ABNORMAL LOW
Glucose, Bld: 103 — ABNORMAL HIGH
Potassium: 4.5
Sodium: 132 — ABNORMAL LOW
Total Protein: 5.6 — ABNORMAL LOW

## 2011-07-23 LAB — BONE MARROW EXAM

## 2011-07-24 LAB — CBC
Hemoglobin: 10.4 — ABNORMAL LOW
MCHC: 34.5
MCV: 100.8 — ABNORMAL HIGH
RBC: 2.99 — ABNORMAL LOW
WBC: 6

## 2011-07-24 LAB — DIFFERENTIAL
Basophils Absolute: 0
Eosinophils Absolute: 0
Eosinophils Relative: 1
Lymphs Abs: 0.5 — ABNORMAL LOW
Neutrophils Relative %: 81 — ABNORMAL HIGH

## 2011-07-24 LAB — PROTEIN ELECTROPH W RFLX QUANT IMMUNOGLOBULINS
Beta Globulin: 6.1
M-Spike, %: NOT DETECTED
Total Protein ELP: 6

## 2011-07-24 LAB — IMMUNOFIXATION ELECTROPHORESIS
IgM, Serum: 78
Total Protein ELP: 5.9 — ABNORMAL LOW

## 2011-07-24 LAB — COMPREHENSIVE METABOLIC PANEL
ALT: 14
AST: 13
CO2: 25
Calcium: 9.4
Chloride: 103
Creatinine, Ser: 1.52 — ABNORMAL HIGH
GFR calc non Af Amer: 34 — ABNORMAL LOW
Glucose, Bld: 90
Sodium: 135
Total Bilirubin: 0.6

## 2011-07-24 LAB — IMMUNOFIXATION ADD-ON

## 2011-07-24 LAB — IGG, IGA, IGM
IgA: 118
IgM, Serum: 83

## 2011-07-25 ENCOUNTER — Other Ambulatory Visit (HOSPITAL_COMMUNITY): Payer: Self-pay | Admitting: Oncology

## 2011-07-25 DIAGNOSIS — G8929 Other chronic pain: Secondary | ICD-10-CM

## 2011-07-25 DIAGNOSIS — M545 Low back pain: Secondary | ICD-10-CM

## 2011-07-25 LAB — CBC
HCT: 25.4 — ABNORMAL LOW
Hemoglobin: 8.5 — ABNORMAL LOW
MCHC: 33.5
RDW: 17.6 — ABNORMAL HIGH

## 2011-07-25 MED ORDER — OXYCODONE-ACETAMINOPHEN 10-325 MG PO TABS: ORAL_TABLET | ORAL | Status: DC

## 2011-07-27 LAB — CBC
HCT: 27.3 % — ABNORMAL LOW (ref 36.0–46.0)
HCT: 28.8 — ABNORMAL LOW
Hemoglobin: 9.2 g/dL — ABNORMAL LOW (ref 12.0–15.0)
Hemoglobin: 9.7 — ABNORMAL LOW
MCHC: 34.2 g/dL (ref 30.0–36.0)
MCV: 102.7 fL — ABNORMAL HIGH (ref 78.0–100.0)
MCV: 101.1 — ABNORMAL HIGH
Platelets: 116 10*3/uL — ABNORMAL LOW (ref 150–400)
Platelets: 122 10*3/uL — ABNORMAL LOW (ref 150–400)
RBC: 2.66 MIL/uL — ABNORMAL LOW (ref 3.87–5.11)
RBC: 2.8 MIL/uL — ABNORMAL LOW (ref 3.87–5.11)
RBC: 2.85 — ABNORMAL LOW
RBC: 2.89 — ABNORMAL LOW
RDW: 19 % — ABNORMAL HIGH (ref 11.5–15.5)
RDW: 17.4 — ABNORMAL HIGH
WBC: 6.4 10*3/uL (ref 4.0–10.5)
WBC: 1.5 — ABNORMAL LOW

## 2011-07-27 LAB — URINALYSIS, ROUTINE W REFLEX MICROSCOPIC
Glucose, UA: NEGATIVE mg/dL
Leukocytes, UA: NEGATIVE
Nitrite: NEGATIVE
Protein, ur: NEGATIVE mg/dL

## 2011-07-27 LAB — BASIC METABOLIC PANEL
BUN: 54 mg/dL — ABNORMAL HIGH (ref 6–23)
Chloride: 104 mEq/L (ref 96–112)
Chloride: 112
GFR calc Af Amer: 36 mL/min — ABNORMAL LOW (ref >60)
GFR calc Af Amer: 33 — ABNORMAL LOW
GFR calc non Af Amer: 29 mL/min — ABNORMAL LOW (ref >60)
Potassium: 4.9 mEq/L (ref 3.5–5.1)
Potassium: 5.2 — ABNORMAL HIGH
Sodium: 136 mEq/L (ref 135–145)
Sodium: 139

## 2011-07-27 LAB — COMPREHENSIVE METABOLIC PANEL
ALT: 18 U/L (ref 0–35)
ALT: 16
AST: 13 U/L (ref 0–37)
Albumin: 3.7 g/dL (ref 3.5–5.2)
Alkaline Phosphatase: 42
CO2: 19
Calcium: 9.7 mg/dL (ref 8.4–10.5)
GFR calc Af Amer: 48 mL/min — ABNORMAL LOW (ref >60)
Glucose, Bld: 107 — ABNORMAL HIGH
Potassium: 4.9
Sodium: 139 mEq/L (ref 135–145)
Sodium: 140
Total Protein: 6.1 g/dL (ref 6.0–8.3)
Total Protein: 6.4

## 2011-07-27 LAB — IMMUNOFIXATION ELECTROPHORESIS
IgM, Serum: 72 mg/dL (ref 60–263)
IgM, Serum: 37 — ABNORMAL LOW

## 2011-07-27 LAB — URINE MICROSCOPIC-ADD ON

## 2011-07-27 LAB — DIFFERENTIAL
Basophils Absolute: 0 10*3/uL (ref 0.0–0.1)
Basophils Relative: 0 % (ref 0–1)
Basophils Relative: 1
Eosinophils Absolute: 0 10*3/uL (ref 0.0–0.7)
Eosinophils Absolute: 0.1 — ABNORMAL LOW
Eosinophils Relative: 0 % (ref 0–5)
Eosinophils Relative: 6 — ABNORMAL HIGH
Lymphocytes Relative: 29
Lymphs Abs: 0.3 10*3/uL — ABNORMAL LOW (ref 0.7–4.0)
Lymphs Abs: 0.4 — ABNORMAL LOW
Monocytes Relative: 7 % (ref 3–12)
Monocytes Relative: 39 — ABNORMAL HIGH
Myelocytes: 0
Neutro Abs: 6 10*3/uL (ref 1.7–7.7)
Neutrophils Relative %: 93 % — ABNORMAL HIGH (ref 43–77)
Neutrophils Relative %: 56
Promyelocytes Absolute: 0

## 2011-07-27 LAB — PROTEIN ELECTROPHORESIS, SERUM
Albumin ELP: 60
Total Protein ELP: 6.3

## 2011-07-27 LAB — PROTEIN ELECTROPH W RFLX QUANT IMMUNOGLOBULINS
Albumin ELP: 60.8 % (ref 55.8–66.1)
Alpha-1-Globulin: 6 % — ABNORMAL HIGH (ref 2.9–4.9)
Gamma Globulin: 8.3 % — ABNORMAL LOW (ref 11.1–18.8)

## 2011-07-27 LAB — WET PREP, GENITAL: Trich, Wet Prep: NONE SEEN

## 2011-07-27 LAB — GC/CHLAMYDIA PROBE AMP, GENITAL
Chlamydia, DNA Probe: NEGATIVE
GC Probe Amp, Genital: NEGATIVE

## 2011-07-30 ENCOUNTER — Encounter (HOSPITAL_BASED_OUTPATIENT_CLINIC_OR_DEPARTMENT_OTHER): Payer: Medicare Other

## 2011-07-30 ENCOUNTER — Encounter (HOSPITAL_COMMUNITY): Payer: Medicare Other | Attending: Oncology

## 2011-07-30 DIAGNOSIS — E669 Obesity, unspecified: Secondary | ICD-10-CM

## 2011-07-30 DIAGNOSIS — C9000 Multiple myeloma not having achieved remission: Secondary | ICD-10-CM | POA: Insufficient documentation

## 2011-07-30 DIAGNOSIS — D510 Vitamin B12 deficiency anemia due to intrinsic factor deficiency: Secondary | ICD-10-CM

## 2011-07-30 DIAGNOSIS — N182 Chronic kidney disease, stage 2 (mild): Secondary | ICD-10-CM

## 2011-07-30 DIAGNOSIS — D638 Anemia in other chronic diseases classified elsewhere: Secondary | ICD-10-CM | POA: Insufficient documentation

## 2011-07-30 DIAGNOSIS — E538 Deficiency of other specified B group vitamins: Secondary | ICD-10-CM | POA: Insufficient documentation

## 2011-07-30 DIAGNOSIS — M810 Age-related osteoporosis without current pathological fracture: Secondary | ICD-10-CM | POA: Insufficient documentation

## 2011-07-30 LAB — COMPREHENSIVE METABOLIC PANEL
Alkaline Phosphatase: 43 U/L (ref 39–117)
BUN: 50 mg/dL — ABNORMAL HIGH (ref 6–23)
Chloride: 98 mEq/L (ref 96–112)
GFR calc Af Amer: 25 mL/min — ABNORMAL LOW (ref >90)
GFR calc non Af Amer: 22 mL/min — ABNORMAL LOW (ref >90)
Glucose, Bld: 105 mg/dL — ABNORMAL HIGH (ref 70–99)
Potassium: 3.2 mEq/L — ABNORMAL LOW (ref 3.5–5.1)
Total Bilirubin: 0.5 mg/dL (ref 0.3–1.2)

## 2011-07-30 LAB — CBC
HCT: 36.1 % (ref 36.0–46.0)
MCH: 35 pg — ABNORMAL HIGH (ref 26.0–34.0)
MCV: 106.2 fL — ABNORMAL HIGH (ref 78.0–100.0)
Platelets: 188 10*3/uL (ref 150–400)
RBC: 3.4 MIL/uL — ABNORMAL LOW (ref 3.87–5.11)
RDW: 14.4 % (ref 11.5–15.5)

## 2011-07-30 LAB — DIFFERENTIAL
Eosinophils Relative: 0 % (ref 0–5)
Lymphs Abs: 0.3 10*3/uL — ABNORMAL LOW (ref 0.7–4.0)
Monocytes Absolute: 1.1 10*3/uL — ABNORMAL HIGH (ref 0.1–1.0)
Neutro Abs: 14.5 10*3/uL — ABNORMAL HIGH (ref 1.7–7.7)

## 2011-07-30 MED ORDER — HEPARIN SOD (PORK) LOCK FLUSH 100 UNIT/ML IV SOLN
INTRAVENOUS | Status: AC
  Filled 2011-07-30: qty 5

## 2011-07-30 MED ORDER — ZOLEDRONIC ACID 4 MG/5ML IV CONC
2.0000 mg | Freq: Once | INTRAVENOUS | Status: AC
  Administered 2011-07-30: 2 mg via INTRAVENOUS
  Filled 2011-07-30: qty 2.5

## 2011-07-30 MED ORDER — CYANOCOBALAMIN 1000 MCG/ML IJ SOLN
1000.0000 ug | Freq: Once | INTRAMUSCULAR | Status: AC
  Administered 2011-07-30: 1000 ug via INTRAMUSCULAR

## 2011-07-30 MED ORDER — HEPARIN SOD (PORK) LOCK FLUSH 100 UNIT/ML IV SOLN
500.0000 [IU] | Freq: Once | INTRAVENOUS | Status: AC
  Administered 2011-07-30: 500 [IU] via INTRAVENOUS
  Filled 2011-07-30: qty 5

## 2011-07-30 MED ORDER — SODIUM CHLORIDE 0.9 % IV SOLN
INTRAVENOUS | Status: DC
  Administered 2011-07-30: 10:00:00 via INTRAVENOUS

## 2011-07-30 NOTE — Progress Notes (Signed)
0945 Procrit 40,000 units held today.  Hgb 11.9.          Vitamin B-12 1000 mcg given IM to left deltoid.  Tolerated well.

## 2011-07-30 NOTE — Progress Notes (Signed)
Labs drawn today for cbc,diff,mm panel,kllc

## 2011-07-31 ENCOUNTER — Telehealth (HOSPITAL_COMMUNITY): Payer: Self-pay | Admitting: *Deleted

## 2011-07-31 LAB — COMPREHENSIVE METABOLIC PANEL
AST: 14
Albumin: 3.5
Chloride: 107
Creatinine, Ser: 1.35 — ABNORMAL HIGH
GFR calc Af Amer: 47 — ABNORMAL LOW
Potassium: 4.3
Total Bilirubin: 0.4

## 2011-07-31 LAB — IMMUNOFIXATION ADD-ON

## 2011-07-31 LAB — PROTEIN ELECTROPH W RFLX QUANT IMMUNOGLOBULINS
Alpha-2-Globulin: 13.5 — ABNORMAL HIGH
Beta 2: 4.5
Beta Globulin: 6.8
Gamma Globulin: 9.6 — ABNORMAL LOW
M-Spike, %: NOT DETECTED

## 2011-07-31 LAB — KAPPA/LAMBDA LIGHT CHAINS: Kappa free light chain: 3.27 mg/dL — ABNORMAL HIGH (ref 0.33–1.94)

## 2011-07-31 LAB — IMMUNOFIXATION ELECTROPHORESIS: IgM, Serum: 30 — ABNORMAL LOW

## 2011-07-31 LAB — DIFFERENTIAL
Basophils Absolute: 0
Basophils Absolute: 0
Basophils Relative: 1
Eosinophils Absolute: 0.1 — ABNORMAL LOW
Eosinophils Relative: 4
Lymphocytes Relative: 31
Monocytes Absolute: 0.4
Neutro Abs: 1.9
Neutrophils Relative %: 74

## 2011-07-31 LAB — IGG, IGA, IGM
IgA: 92
IgG (Immunoglobin G), Serum: 541 — ABNORMAL LOW

## 2011-07-31 LAB — CBC
MCHC: 33.5
MCV: 102.2 — ABNORMAL HIGH
Platelets: 103 — ABNORMAL LOW
Platelets: 120 — ABNORMAL LOW
RDW: 18.5 — ABNORMAL HIGH
WBC: 2.4 — ABNORMAL LOW

## 2011-07-31 NOTE — Telephone Encounter (Signed)
Message copied by Dennie Maizes on Tue Jul 31, 2011  2:12 PM ------      Message from: Mariel Sleet, ERIC S      Created: Mon Jul 30, 2011 12:12 PM       Taking her K+ and how much?      Can she change her lasix to every day?

## 2011-07-31 NOTE — Telephone Encounter (Signed)
Per [facility], has been off Kdur since February.  Instructed to restart Kdur  20 meq and to take 1 pill bid for 7 days and then 1 pill daily.  Rx called to [facility] for 60 tablets and refills x 6 per request of Dr. Mariel Sleet.

## 2011-08-01 LAB — MULTIPLE MYELOMA PANEL, SERUM
Alpha-2-Globulin: 13.6 % — ABNORMAL HIGH (ref 7.1–11.8)
IgA: 159 mg/dL (ref 69–380)
IgG (Immunoglobin G), Serum: 750 mg/dL (ref 690–1700)
M-Spike, %: NOT DETECTED g/dL
Total Protein: 7.3 g/dL (ref 6.0–8.3)

## 2011-08-02 ENCOUNTER — Other Ambulatory Visit (HOSPITAL_COMMUNITY): Payer: Self-pay | Admitting: Oncology

## 2011-08-02 ENCOUNTER — Telehealth (HOSPITAL_COMMUNITY): Payer: Self-pay | Admitting: Oncology

## 2011-08-02 DIAGNOSIS — G8929 Other chronic pain: Secondary | ICD-10-CM

## 2011-08-02 DIAGNOSIS — R05 Cough: Secondary | ICD-10-CM

## 2011-08-02 DIAGNOSIS — R058 Other specified cough: Secondary | ICD-10-CM

## 2011-08-02 DIAGNOSIS — R059 Cough, unspecified: Secondary | ICD-10-CM

## 2011-08-02 MED ORDER — AZITHROMYCIN 250 MG PO TABS: ORAL_TABLET | ORAL | Status: AC

## 2011-08-02 MED ORDER — METHADONE HCL 5 MG PO TABS: 10.0000 mg | ORAL_TABLET | Freq: Two times a day (BID) | ORAL | Status: DC

## 2011-08-02 NOTE — Telephone Encounter (Signed)
Patient called reporting a cough with some shortness of breath.  Her cough is productive of yellow sputum.  She denies any fevers.    I e-scribed a Z-Pak to be taken as directed to [facility].  The patient also needed a refill of her methadone.  This was completed as well.

## 2011-08-03 LAB — PROTEIN ELECTROPH W RFLX QUANT IMMUNOGLOBULINS
Alpha-2-Globulin: 14.1 — ABNORMAL HIGH
Beta 2: 4.7
Beta Globulin: 6.4
Gamma Globulin: 7.7 — ABNORMAL LOW
M-Spike, %: NOT DETECTED
Total Protein ELP: 6

## 2011-08-03 LAB — CBC
Hemoglobin: 9 — ABNORMAL LOW
RBC: 2.69 — ABNORMAL LOW
RDW: 18.5 — ABNORMAL HIGH

## 2011-08-03 LAB — DIFFERENTIAL
Basophils Relative: 0
Eosinophils Absolute: 0
Eosinophils Relative: 3
Lymphs Abs: 0.1 — ABNORMAL LOW
Monocytes Relative: 9
Neutrophils Relative %: 78 — ABNORMAL HIGH

## 2011-08-03 LAB — COMPREHENSIVE METABOLIC PANEL
ALT: 15
AST: 13
Alkaline Phosphatase: 38 — ABNORMAL LOW
CO2: 20
GFR calc Af Amer: 46 — ABNORMAL LOW
Glucose, Bld: 104 — ABNORMAL HIGH
Potassium: 4.4
Sodium: 138
Total Protein: 5.8 — ABNORMAL LOW

## 2011-08-03 LAB — IMMUNOFIXATION ELECTROPHORESIS: IgA: 51 — ABNORMAL LOW

## 2011-08-06 LAB — COMPREHENSIVE METABOLIC PANEL
AST: 13
BUN: 45 — ABNORMAL HIGH
CO2: 26
Calcium: 8.9
Chloride: 104
Creatinine, Ser: 1.3 — ABNORMAL HIGH
GFR calc Af Amer: 49 — ABNORMAL LOW
GFR calc non Af Amer: 40 — ABNORMAL LOW
Glucose, Bld: 173 — ABNORMAL HIGH
Total Bilirubin: 0.5

## 2011-08-06 LAB — CBC
HCT: 24.9 — ABNORMAL LOW
Hemoglobin: 8.5 — ABNORMAL LOW
MCHC: 34.3
MCV: 96.4
RBC: 2.59 — ABNORMAL LOW
WBC: 2.8 — ABNORMAL LOW

## 2011-08-06 LAB — DIFFERENTIAL
Basophils Absolute: 0
Eosinophils Relative: 0
Lymphs Abs: 0.3 — ABNORMAL LOW
Monocytes Relative: 17 — ABNORMAL HIGH
Neutrophils Relative %: 73

## 2011-08-06 LAB — PROTEIN ELECTROPH W RFLX QUANT IMMUNOGLOBULINS
Alpha-1-Globulin: 5.5 — ABNORMAL HIGH
Gamma Globulin: 9.9 — ABNORMAL LOW
M-Spike, %: NOT DETECTED
Total Protein ELP: 6.6

## 2011-08-06 LAB — IMMUNOFIXATION ELECTROPHORESIS
IgA: 122
IgG (Immunoglobin G), Serum: 644 — ABNORMAL LOW
Total Protein ELP: 6.5

## 2011-08-08 LAB — CBC
HCT: 33.2 — ABNORMAL LOW
MCV: 97.7
Platelets: 212
RDW: 19.4 — ABNORMAL HIGH

## 2011-08-08 LAB — POTASSIUM: Potassium: 3.7

## 2011-08-08 LAB — DIFFERENTIAL
Basophils Absolute: 0
Basophils Relative: 0
Eosinophils Absolute: 0.1
Eosinophils Relative: 1
Neutrophils Relative %: 86 — ABNORMAL HIGH

## 2011-08-09 LAB — UIFE/LIGHT CHAINS/TP QN, 24-HR UR
Alpha 2, Urine: NOT DETECTED
Beta, Urine: NOT DETECTED
Free Kappa Lt Chains,Ur: 1.55 — ABNORMAL HIGH (ref 0.04–1.51)
Free Lt Chn Excr Rate: 46.5
Gamma Globulin, Urine: NOT DETECTED
Total Protein, Urine-Ur/day: 96 (ref 10–140)

## 2011-08-13 ENCOUNTER — Encounter (HOSPITAL_BASED_OUTPATIENT_CLINIC_OR_DEPARTMENT_OTHER): Payer: Medicare Other

## 2011-08-13 DIAGNOSIS — C9000 Multiple myeloma not having achieved remission: Secondary | ICD-10-CM

## 2011-08-13 LAB — CBC
HCT: 35.4 % — ABNORMAL LOW (ref 36.0–46.0)
Hemoglobin: 12 g/dL (ref 12.0–15.0)
MCH: 35.1 pg — ABNORMAL HIGH (ref 26.0–34.0)
MCV: 103.5 fL — ABNORMAL HIGH (ref 78.0–100.0)
RBC: 3.42 MIL/uL — ABNORMAL LOW (ref 3.87–5.11)

## 2011-08-13 LAB — DIFFERENTIAL
Eosinophils Absolute: 0 10*3/uL (ref 0.0–0.7)
Lymphs Abs: 0.5 10*3/uL — ABNORMAL LOW (ref 0.7–4.0)
Monocytes Absolute: 0.6 10*3/uL (ref 0.1–1.0)
Monocytes Relative: 7 % (ref 3–12)
Neutrophils Relative %: 88 % — ABNORMAL HIGH (ref 43–77)

## 2011-08-13 NOTE — Progress Notes (Signed)
Procrit injection held.  HGB 12.

## 2011-08-21 ENCOUNTER — Encounter: Payer: Self-pay | Admitting: Oncology

## 2011-08-22 ENCOUNTER — Other Ambulatory Visit (HOSPITAL_COMMUNITY): Payer: Self-pay | Admitting: Oncology

## 2011-08-22 ENCOUNTER — Telehealth (HOSPITAL_COMMUNITY): Payer: Self-pay | Admitting: Oncology

## 2011-08-22 DIAGNOSIS — G8929 Other chronic pain: Secondary | ICD-10-CM

## 2011-08-22 DIAGNOSIS — R609 Edema, unspecified: Secondary | ICD-10-CM

## 2011-08-22 MED ORDER — OXYCODONE-ACETAMINOPHEN 10-325 MG PO TABS: ORAL_TABLET | ORAL | Status: DC

## 2011-08-22 MED ORDER — SPIRONOLACTONE 50 MG PO TABS: 50.0000 mg | ORAL_TABLET | ORAL | Status: DC

## 2011-08-27 ENCOUNTER — Encounter (HOSPITAL_BASED_OUTPATIENT_CLINIC_OR_DEPARTMENT_OTHER): Payer: Medicare Other

## 2011-08-27 ENCOUNTER — Encounter (HOSPITAL_COMMUNITY): Payer: Medicare Other | Attending: Oncology

## 2011-08-27 DIAGNOSIS — E669 Obesity, unspecified: Secondary | ICD-10-CM

## 2011-08-27 DIAGNOSIS — D638 Anemia in other chronic diseases classified elsewhere: Secondary | ICD-10-CM

## 2011-08-27 DIAGNOSIS — E538 Deficiency of other specified B group vitamins: Secondary | ICD-10-CM

## 2011-08-27 DIAGNOSIS — N182 Chronic kidney disease, stage 2 (mild): Secondary | ICD-10-CM | POA: Insufficient documentation

## 2011-08-27 DIAGNOSIS — D510 Vitamin B12 deficiency anemia due to intrinsic factor deficiency: Secondary | ICD-10-CM

## 2011-08-27 DIAGNOSIS — C9000 Multiple myeloma not having achieved remission: Secondary | ICD-10-CM | POA: Insufficient documentation

## 2011-08-27 DIAGNOSIS — M810 Age-related osteoporosis without current pathological fracture: Secondary | ICD-10-CM | POA: Insufficient documentation

## 2011-08-27 LAB — CBC
HCT: 32.3 % — ABNORMAL LOW (ref 36.0–46.0)
Hemoglobin: 10.8 g/dL — ABNORMAL LOW (ref 12.0–15.0)
MCH: 34.8 pg — ABNORMAL HIGH (ref 26.0–34.0)
MCHC: 33.4 g/dL (ref 30.0–36.0)
RBC: 3.1 MIL/uL — ABNORMAL LOW (ref 3.87–5.11)

## 2011-08-27 LAB — DIFFERENTIAL
Basophils Absolute: 0 10*3/uL (ref 0.0–0.1)
Eosinophils Absolute: 0.1 10*3/uL (ref 0.0–0.7)
Lymphs Abs: 0.7 10*3/uL (ref 0.7–4.0)
Monocytes Absolute: 0.5 10*3/uL (ref 0.1–1.0)
Monocytes Relative: 7 % (ref 3–12)
Neutro Abs: 5.8 10*3/uL (ref 1.7–7.7)

## 2011-08-27 LAB — COMPREHENSIVE METABOLIC PANEL
AST: 14 U/L (ref 0–37)
Albumin: 4 g/dL (ref 3.5–5.2)
Chloride: 107 mEq/L (ref 96–112)
Creatinine, Ser: 2.5 mg/dL — ABNORMAL HIGH (ref 0.50–1.10)
Total Bilirubin: 0.2 mg/dL — ABNORMAL LOW (ref 0.3–1.2)
Total Protein: 7 g/dL (ref 6.0–8.3)

## 2011-08-27 MED ORDER — HEPARIN SOD (PORK) LOCK FLUSH 100 UNIT/ML IV SOLN
INTRAVENOUS | Status: AC
  Administered 2011-08-27: 500 [IU]
  Filled 2011-08-27: qty 5

## 2011-08-27 MED ORDER — EPOETIN ALFA 40000 UNIT/ML IJ SOLN
40000.0000 [IU] | Freq: Once | INTRAMUSCULAR | Status: AC
  Administered 2011-08-27: 40000 [IU] via SUBCUTANEOUS

## 2011-08-27 MED ORDER — EPOETIN ALFA 40000 UNIT/ML IJ SOLN
INTRAMUSCULAR | Status: AC
  Administered 2011-08-27: 40000 [IU] via SUBCUTANEOUS
  Filled 2011-08-27: qty 1

## 2011-08-27 MED ORDER — CYANOCOBALAMIN 1000 MCG/ML IJ SOLN
INTRAMUSCULAR | Status: AC
  Administered 2011-08-27: 1000 ug via INTRAMUSCULAR
  Filled 2011-08-27: qty 1

## 2011-08-27 MED ORDER — CYANOCOBALAMIN 1000 MCG/ML IJ SOLN
1000.0000 ug | Freq: Once | INTRAMUSCULAR | Status: AC
  Administered 2011-08-27: 1000 ug via INTRAMUSCULAR

## 2011-08-27 NOTE — Progress Notes (Signed)
Vitamin B-12 given IM Z-track to left arm,.  Procrit 82956 units given subcutaneously to lower left abd.  Tolerated well.

## 2011-08-27 NOTE — Progress Notes (Signed)
Pt notified to take lasix every other day and return in 10 days for B-Met. Then 14 days for poss zometa. Need to push fluids.

## 2011-08-27 NOTE — Progress Notes (Signed)
Pt had 2 encounters today.

## 2011-08-29 LAB — MULTIPLE MYELOMA PANEL, SERUM
Albumin ELP: 59.3 % (ref 55.8–66.1)
Alpha-2-Globulin: 14.1 % — ABNORMAL HIGH (ref 7.1–11.8)
IgA: 141 mg/dL (ref 69–380)
IgG (Immunoglobin G), Serum: 671 mg/dL — ABNORMAL LOW (ref 690–1700)
M-Spike, %: NOT DETECTED g/dL
Total Protein: 8.1 g/dL (ref 6.0–8.3)

## 2011-08-30 ENCOUNTER — Telehealth (HOSPITAL_COMMUNITY): Payer: Self-pay | Admitting: Oncology

## 2011-08-30 ENCOUNTER — Other Ambulatory Visit (HOSPITAL_COMMUNITY): Payer: Self-pay | Admitting: Oncology

## 2011-08-30 DIAGNOSIS — M545 Low back pain: Secondary | ICD-10-CM

## 2011-08-30 DIAGNOSIS — G8929 Other chronic pain: Secondary | ICD-10-CM

## 2011-08-30 MED ORDER — METHADONE HCL 5 MG PO TABS: 10.0000 mg | ORAL_TABLET | Freq: Two times a day (BID) | ORAL | Status: DC

## 2011-09-06 ENCOUNTER — Encounter (HOSPITAL_BASED_OUTPATIENT_CLINIC_OR_DEPARTMENT_OTHER): Payer: Medicare Other

## 2011-09-06 DIAGNOSIS — E538 Deficiency of other specified B group vitamins: Secondary | ICD-10-CM

## 2011-09-06 DIAGNOSIS — C9000 Multiple myeloma not having achieved remission: Secondary | ICD-10-CM

## 2011-09-06 DIAGNOSIS — N182 Chronic kidney disease, stage 2 (mild): Secondary | ICD-10-CM

## 2011-09-06 DIAGNOSIS — E669 Obesity, unspecified: Secondary | ICD-10-CM

## 2011-09-06 DIAGNOSIS — D510 Vitamin B12 deficiency anemia due to intrinsic factor deficiency: Secondary | ICD-10-CM

## 2011-09-06 LAB — CBC
HCT: 34 % — ABNORMAL LOW (ref 36.0–46.0)
MCV: 104.9 fL — ABNORMAL HIGH (ref 78.0–100.0)
Platelets: 161 10*3/uL (ref 150–400)
RBC: 3.24 MIL/uL — ABNORMAL LOW (ref 3.87–5.11)
RDW: 14.5 % (ref 11.5–15.5)
WBC: 5.9 10*3/uL (ref 4.0–10.5)

## 2011-09-06 LAB — DIFFERENTIAL
Basophils Absolute: 0 10*3/uL (ref 0.0–0.1)
Eosinophils Relative: 1 % (ref 0–5)
Lymphocytes Relative: 10 % — ABNORMAL LOW (ref 12–46)
Lymphs Abs: 0.6 10*3/uL — ABNORMAL LOW (ref 0.7–4.0)
Neutro Abs: 4.6 10*3/uL (ref 1.7–7.7)

## 2011-09-06 LAB — BASIC METABOLIC PANEL
CO2: 24 mEq/L (ref 19–32)
Calcium: 10 mg/dL (ref 8.4–10.5)
Chloride: 103 mEq/L (ref 96–112)
Creatinine, Ser: 2.32 mg/dL — ABNORMAL HIGH (ref 0.50–1.10)
GFR calc Af Amer: 22 mL/min — ABNORMAL LOW (ref >90)
Sodium: 138 mEq/L (ref 135–145)

## 2011-09-06 NOTE — Progress Notes (Signed)
Labs drawn today for cbc,diff,bmp

## 2011-09-10 ENCOUNTER — Encounter (HOSPITAL_COMMUNITY): Payer: Medicare Other

## 2011-09-20 ENCOUNTER — Telehealth (HOSPITAL_COMMUNITY): Payer: Self-pay | Admitting: *Deleted

## 2011-09-21 ENCOUNTER — Other Ambulatory Visit (HOSPITAL_COMMUNITY): Payer: Self-pay | Admitting: Oncology

## 2011-09-21 DIAGNOSIS — G8929 Other chronic pain: Secondary | ICD-10-CM

## 2011-09-21 MED ORDER — OXYCODONE-ACETAMINOPHEN 10-325 MG PO TABS: ORAL_TABLET | ORAL | Status: DC

## 2011-09-24 ENCOUNTER — Encounter (HOSPITAL_COMMUNITY): Payer: Self-pay | Admitting: *Deleted

## 2011-09-24 ENCOUNTER — Encounter (HOSPITAL_COMMUNITY): Payer: Medicare Other | Attending: Oncology

## 2011-09-24 DIAGNOSIS — N182 Chronic kidney disease, stage 2 (mild): Secondary | ICD-10-CM

## 2011-09-24 DIAGNOSIS — D510 Vitamin B12 deficiency anemia due to intrinsic factor deficiency: Secondary | ICD-10-CM

## 2011-09-24 DIAGNOSIS — M810 Age-related osteoporosis without current pathological fracture: Secondary | ICD-10-CM | POA: Insufficient documentation

## 2011-09-24 DIAGNOSIS — C9000 Multiple myeloma not having achieved remission: Secondary | ICD-10-CM

## 2011-09-24 DIAGNOSIS — E669 Obesity, unspecified: Secondary | ICD-10-CM

## 2011-09-24 DIAGNOSIS — E538 Deficiency of other specified B group vitamins: Secondary | ICD-10-CM

## 2011-09-24 DIAGNOSIS — D638 Anemia in other chronic diseases classified elsewhere: Secondary | ICD-10-CM | POA: Insufficient documentation

## 2011-09-24 LAB — COMPREHENSIVE METABOLIC PANEL
ALT: 12 U/L (ref 0–35)
Alkaline Phosphatase: 38 U/L — ABNORMAL LOW (ref 39–117)
BUN: 35 mg/dL — ABNORMAL HIGH (ref 6–23)
CO2: 25 mEq/L (ref 19–32)
Calcium: 10.1 mg/dL (ref 8.4–10.5)
GFR calc Af Amer: 28 mL/min — ABNORMAL LOW (ref >90)
GFR calc non Af Amer: 24 mL/min — ABNORMAL LOW (ref >90)
Glucose, Bld: 106 mg/dL — ABNORMAL HIGH (ref 70–99)
Sodium: 137 mEq/L (ref 135–145)

## 2011-09-24 LAB — CBC
HCT: 30.6 % — ABNORMAL LOW (ref 36.0–46.0)
Hemoglobin: 10.1 g/dL — ABNORMAL LOW (ref 12.0–15.0)
MCV: 104.1 fL — ABNORMAL HIGH (ref 78.0–100.0)
WBC: 6.5 10*3/uL (ref 4.0–10.5)

## 2011-09-24 LAB — DIFFERENTIAL
Basophils Absolute: 0 10*3/uL (ref 0.0–0.1)
Basophils Relative: 1 % (ref 0–1)
Eosinophils Absolute: 0 10*3/uL (ref 0.0–0.7)
Eosinophils Relative: 1 % (ref 0–5)
Lymphocytes Relative: 11 % — ABNORMAL LOW (ref 12–46)
Monocytes Absolute: 0.6 10*3/uL (ref 0.1–1.0)

## 2011-09-24 MED ORDER — EPOETIN ALFA 40000 UNIT/ML IJ SOLN
INTRAMUSCULAR | Status: AC
  Administered 2011-09-24: 40000 [IU] via SUBCUTANEOUS
  Filled 2011-09-24: qty 1

## 2011-09-24 MED ORDER — CYANOCOBALAMIN 1000 MCG/ML IJ SOLN
1000.0000 ug | Freq: Once | INTRAMUSCULAR | Status: AC
  Administered 2011-09-24: 1000 ug via INTRAMUSCULAR

## 2011-09-24 MED ORDER — HEPARIN SOD (PORK) LOCK FLUSH 100 UNIT/ML IV SOLN
500.0000 [IU] | Freq: Once | INTRAVENOUS | Status: AC
  Administered 2011-09-24: 500 [IU] via INTRAVENOUS
  Filled 2011-09-24: qty 5

## 2011-09-24 MED ORDER — CYANOCOBALAMIN 1000 MCG/ML IJ SOLN
INTRAMUSCULAR | Status: AC
  Administered 2011-09-24: 1000 ug via INTRAMUSCULAR
  Filled 2011-09-24: qty 1

## 2011-09-24 MED ORDER — EPOETIN ALFA 40000 UNIT/ML IJ SOLN
40000.0000 [IU] | Freq: Once | INTRAMUSCULAR | Status: AC
  Administered 2011-09-24: 40000 [IU] via SUBCUTANEOUS

## 2011-09-24 MED ORDER — SODIUM CHLORIDE 0.9 % IV SOLN
2.0000 mg | Freq: Once | INTRAVENOUS | Status: AC
  Administered 2011-09-24: 2 mg via INTRAVENOUS
  Filled 2011-09-24: qty 2.5

## 2011-09-24 MED ORDER — SODIUM CHLORIDE 0.9 % IJ SOLN
INTRAMUSCULAR | Status: AC
  Filled 2011-09-24: qty 10

## 2011-09-24 MED ORDER — SODIUM CHLORIDE 0.9 % IJ SOLN
10.0000 mL | Freq: Once | INTRAMUSCULAR | Status: AC
  Administered 2011-09-24: 10 mL via INTRAVENOUS
  Filled 2011-09-24: qty 10

## 2011-09-24 MED ORDER — HEPARIN SOD (PORK) LOCK FLUSH 100 UNIT/ML IV SOLN
INTRAVENOUS | Status: AC
  Filled 2011-09-24: qty 5

## 2011-09-24 NOTE — Progress Notes (Signed)
Tolerated well, Zometa give after council with pharmacy and PA.

## 2011-09-27 ENCOUNTER — Telehealth (HOSPITAL_COMMUNITY): Payer: Self-pay | Admitting: Oncology

## 2011-09-27 ENCOUNTER — Other Ambulatory Visit (HOSPITAL_COMMUNITY): Payer: Self-pay | Admitting: Oncology

## 2011-09-27 DIAGNOSIS — G8929 Other chronic pain: Secondary | ICD-10-CM

## 2011-09-27 LAB — MULTIPLE MYELOMA PANEL, SERUM
Alpha-2-Globulin: 13.3 % — ABNORMAL HIGH (ref 7.1–11.8)
Gamma Globulin: 9.7 % — ABNORMAL LOW (ref 11.1–18.8)
IgG (Immunoglobin G), Serum: 582 mg/dL — ABNORMAL LOW (ref 690–1700)
M-Spike, %: NOT DETECTED g/dL

## 2011-09-27 MED ORDER — METHADONE HCL 5 MG PO TABS: 10.0000 mg | ORAL_TABLET | Freq: Two times a day (BID) | ORAL | Status: DC

## 2011-10-08 ENCOUNTER — Encounter (HOSPITAL_COMMUNITY): Payer: Medicare Other

## 2011-10-08 ENCOUNTER — Encounter (HOSPITAL_BASED_OUTPATIENT_CLINIC_OR_DEPARTMENT_OTHER): Payer: Medicare Other

## 2011-10-08 ENCOUNTER — Other Ambulatory Visit (HOSPITAL_COMMUNITY): Payer: Self-pay | Admitting: Oncology

## 2011-10-08 DIAGNOSIS — C9000 Multiple myeloma not having achieved remission: Secondary | ICD-10-CM

## 2011-10-08 LAB — CBC
HCT: 34.1 % — ABNORMAL LOW (ref 36.0–46.0)
Hemoglobin: 11.3 g/dL — ABNORMAL LOW (ref 12.0–15.0)
MCHC: 33.1 g/dL (ref 30.0–36.0)

## 2011-10-08 LAB — COMPREHENSIVE METABOLIC PANEL
AST: 14 U/L (ref 0–37)
CO2: 27 mEq/L (ref 19–32)
Calcium: 10.5 mg/dL (ref 8.4–10.5)
Creatinine, Ser: 2.28 mg/dL — ABNORMAL HIGH (ref 0.50–1.10)
GFR calc Af Amer: 23 mL/min — ABNORMAL LOW (ref >90)
GFR calc non Af Amer: 20 mL/min — ABNORMAL LOW (ref >90)

## 2011-10-08 LAB — DIFFERENTIAL
Basophils Relative: 0 % (ref 0–1)
Monocytes Absolute: 1.1 10*3/uL — ABNORMAL HIGH (ref 0.1–1.0)
Monocytes Relative: 14 % — ABNORMAL HIGH (ref 3–12)
Neutro Abs: 6.6 10*3/uL (ref 1.7–7.7)

## 2011-10-08 NOTE — Progress Notes (Signed)
Procrit held, Hgb 11.3. Continue as scheduled, RTC in 2 weeks.

## 2011-10-08 NOTE — Progress Notes (Signed)
Labs drawn today for cbc,cmp,kllc,cmp

## 2011-10-09 LAB — KAPPA/LAMBDA LIGHT CHAINS: Kappa, lambda light chain ratio: 0.72 (ref 0.26–1.65)

## 2011-10-10 LAB — MULTIPLE MYELOMA PANEL, SERUM
Alpha-2-Globulin: 14.2 % — ABNORMAL HIGH (ref 7.1–11.8)
Beta 2: 5.1 % (ref 3.2–6.5)
Beta Globulin: 5.3 % (ref 4.7–7.2)
IgA: 141 mg/dL (ref 69–380)
M-Spike, %: NOT DETECTED g/dL

## 2011-10-17 ENCOUNTER — Encounter: Payer: Self-pay | Admitting: Oncology

## 2011-10-18 ENCOUNTER — Encounter (HOSPITAL_BASED_OUTPATIENT_CLINIC_OR_DEPARTMENT_OTHER): Payer: Medicare Other

## 2011-10-18 DIAGNOSIS — C9000 Multiple myeloma not having achieved remission: Secondary | ICD-10-CM

## 2011-10-18 LAB — COMPREHENSIVE METABOLIC PANEL
ALT: 13 U/L (ref 0–35)
Alkaline Phosphatase: 40 U/L (ref 39–117)
CO2: 25 mEq/L (ref 19–32)
GFR calc Af Amer: 24 mL/min — ABNORMAL LOW (ref >90)
GFR calc non Af Amer: 20 mL/min — ABNORMAL LOW (ref >90)
Glucose, Bld: 123 mg/dL — ABNORMAL HIGH (ref 70–99)
Potassium: 4.3 mEq/L (ref 3.5–5.1)
Sodium: 140 mEq/L (ref 135–145)

## 2011-10-18 LAB — DIFFERENTIAL
Basophils Absolute: 0 10*3/uL (ref 0.0–0.1)
Eosinophils Absolute: 0.1 10*3/uL (ref 0.0–0.7)
Eosinophils Relative: 1 % (ref 0–5)

## 2011-10-18 LAB — CBC
Hemoglobin: 11.5 g/dL — ABNORMAL LOW (ref 12.0–15.0)
MCH: 33.8 pg (ref 26.0–34.0)
RBC: 3.4 MIL/uL — ABNORMAL LOW (ref 3.87–5.11)

## 2011-10-18 NOTE — Progress Notes (Signed)
Labs drawn today for cbc/diff,cmp 

## 2011-10-19 ENCOUNTER — Encounter (HOSPITAL_BASED_OUTPATIENT_CLINIC_OR_DEPARTMENT_OTHER): Payer: Medicare Other | Admitting: Oncology

## 2011-10-19 DIAGNOSIS — C9000 Multiple myeloma not having achieved remission: Secondary | ICD-10-CM

## 2011-10-19 DIAGNOSIS — N289 Disorder of kidney and ureter, unspecified: Secondary | ICD-10-CM

## 2011-10-19 DIAGNOSIS — D638 Anemia in other chronic diseases classified elsewhere: Secondary | ICD-10-CM

## 2011-10-19 NOTE — Progress Notes (Signed)
This office note has been dictated.

## 2011-10-19 NOTE — Patient Instructions (Signed)
[facility]  Discharge Instructions  RECOMMENDATIONS MADE BY THE CONSULTANT AND ANY TEST RESULTS WILL BE SENT TO YOUR REFERRING DOCTOR.   EXAM FINDINGS BY MD TODAY AND SIGNS AND SYMPTOMS TO REPORT TO CLINIC OR PRIMARY MD:  We need you to collect a 24 hr urine specimen. Start Sunday and bring back in on Monday.  MEDICATIONS PRESCRIBED: prescriptions given Follow label directions  INSTRUCTIONS GIVEN AND DISCUSSED: Other  We will hold procrit on Monday due to hemoglobin of 11.5  SPECIAL INSTRUCTIONS/FOLLOW-UP: Return to Clinic in one month   I acknowledge that I have been informed and understand all the instructions given to me and received a copy. I do not have any more questions at this time, but understand that I may call the Specialty Clinic at [facility] at [phone] during business hours should I have any further questions or need assistance in obtaining follow-up care.    __________________________________________  _____________  __________ Signature of Patient or Authorized Representative            Date                   Time    __________________________________________ Nurse's Signature

## 2011-10-19 NOTE — Progress Notes (Signed)
CC:   [name], M.D.  DIAGNOSES: 1. Poorly-differentiated multiple myeloma with a small IgA lambda M     spike.  She presented in December 2007 with a scalp tumor which     showed poorly-differentiated plasma cells, numerous mitotic     figures, immature nuclei and a plasmacytoid appearance but positive     for multiple myeloma on special stains.  She also had a small     amount of protein in her urine. 2. Mild renal insufficiency. 3. Anemia of chronic disease. 4. Severe deconditioning. 5. Chronic back and leg pain from a combination of myeloma and     degenerative joint and disk disease of the low back. 6. History of weakness and fatigue from Velcade with an element of     autonomic neuropathy. 7. Chronic lower extremity edema contained very nicely with     spironolactone 1 on Mondays, Wednesdays and Fridays and Lasix 20 mg     once on Mondays, Wednesdays and Fridays. [name] is here today with her granddaughter and great-grandson to go over the labs from the other day, which really still looked very solid except she is having a slight rise in not only lambda light chains but kappa light chains.  I do not really worry about the kappa light chains, but the lambda light chains are of some concern and we need to find out whether they are monoclonal in nature, so we will do a 24-hour urine collection to see.  We still do not have any M spike detectable on her serum protein electrophoresis.  Her IgM level recently is in the normal range, IgG level is in the normal range and IgA level recently is in the normal range.  So I think it is time just to verify what these lambda light chains and kappa light chains are made up of, and hopefully there multiclonal rather than a single clone.  Her hemoglobin was 11.5 the other day and we will, of course, hold her Procrit for values less than that.  She is on a Procrit regimen every 14 days.  Her BUN was 45 the other day, creatinine 2.22.   That is basically stable and of course, her dose of Zometa is just 2 mg.  her calcium was fine.  Albumin was normal.  Her labs electrolytes were fine.  She has no ankle edema today.  She is still having the chronic back pain, well-controlled with the methadone and the Percocet 10/325.  The methadone is 10 mg b.i.d.  She uses 5 mg tablets.  So her breathing is good.  She has no problems with shortness of breath. She is still getting up.  She is incontinent of urine at times.  That is not new or different, but we will try to get a good urine collection come Sunday when she is off the diuretics and see if we can evaluate whether this is monoclonality in the urine.  We will see her back one way or the other about 8 weeks.  She comes every month for lab work, of course.   ______________________________ Ladona Horns. Mariel Sleet, MD ESN/MEDQ  D:  10/19/2011  T:  10/19/2011  Job:  [id number]

## 2011-10-22 ENCOUNTER — Encounter (HOSPITAL_BASED_OUTPATIENT_CLINIC_OR_DEPARTMENT_OTHER): Payer: Medicare Other

## 2011-10-22 ENCOUNTER — Ambulatory Visit (HOSPITAL_COMMUNITY): Payer: Medicare Other

## 2011-10-22 DIAGNOSIS — C9000 Multiple myeloma not having achieved remission: Secondary | ICD-10-CM

## 2011-10-22 DIAGNOSIS — E538 Deficiency of other specified B group vitamins: Secondary | ICD-10-CM

## 2011-10-22 LAB — CREATININE CLEARANCE, URINE, 24 HOUR
Collection Interval-CRCL: 24 hours
Creatinine Clearance: 28 mL/min — ABNORMAL LOW (ref 75–115)
Creatinine, Urine: 41.53 mg/dL
Creatinine: 2.22 mg/dL — ABNORMAL HIGH (ref 0.50–1.10)

## 2011-10-22 MED ORDER — HEPARIN SOD (PORK) LOCK FLUSH 100 UNIT/ML IV SOLN
500.0000 [IU] | Freq: Once | INTRAVENOUS | Status: AC
  Administered 2011-10-22: 500 [IU] via INTRAVENOUS
  Filled 2011-10-22: qty 5

## 2011-10-22 MED ORDER — ZOLEDRONIC ACID 4 MG/5ML IV CONC
4.0000 mg | Freq: Once | INTRAVENOUS | Status: DC
  Filled 2011-10-22: qty 5

## 2011-10-22 MED ORDER — CYANOCOBALAMIN 1000 MCG/ML IJ SOLN
1000.0000 ug | Freq: Once | INTRAMUSCULAR | Status: AC
  Administered 2011-10-22: 1000 ug via INTRAMUSCULAR

## 2011-10-22 MED ORDER — HEPARIN SOD (PORK) LOCK FLUSH 100 UNIT/ML IV SOLN
INTRAVENOUS | Status: AC
  Administered 2011-10-22: 500 [IU] via INTRAVENOUS
  Filled 2011-10-22: qty 5

## 2011-10-22 MED ORDER — ZOLEDRONIC ACID 4 MG/5ML IV CONC
2.0000 mg | Freq: Once | INTRAVENOUS | Status: AC
  Administered 2011-10-22: 2 mg via INTRAVENOUS
  Filled 2011-10-22: qty 2.5

## 2011-10-22 MED ORDER — SODIUM CHLORIDE 0.9 % IV SOLN
INTRAVENOUS | Status: DC
  Administered 2011-10-22: 09:00:00 via INTRAVENOUS

## 2011-10-22 MED ORDER — CYANOCOBALAMIN 1000 MCG/ML IJ SOLN
INTRAMUSCULAR | Status: AC
  Administered 2011-10-22: 1000 ug via INTRAMUSCULAR
  Filled 2011-10-22: qty 1

## 2011-10-22 NOTE — Progress Notes (Signed)
[name] presents today for injection per MD orders. B12 1000 mcg administered IM in right Upper Arm. Administration without incident. Patient tolerated well.  Zometa 2mg  IV tolerated well.

## 2011-10-25 LAB — UIFE/LIGHT CHAINS/TP QN, 24-HR UR
Alpha 1, Urine: DETECTED — AB
Alpha 2, Urine: DETECTED — AB
Beta, Urine: DETECTED — AB
Free Kappa Lt Chains,Ur: 8.92 mg/dL — ABNORMAL HIGH (ref 0.14–2.42)
Free Kappa/Lambda Ratio: 10.25 ratio (ref 2.04–10.37)
Free Lt Chn Excr Rate: 192.23 mg/d
Gamma Globulin, Urine: DETECTED — AB
Total Protein, Urine: 10.5 mg/dL

## 2011-10-29 ENCOUNTER — Other Ambulatory Visit (HOSPITAL_COMMUNITY): Payer: Medicare Other

## 2011-11-01 ENCOUNTER — Telehealth (HOSPITAL_COMMUNITY): Payer: Self-pay | Admitting: Oncology

## 2011-11-01 NOTE — Telephone Encounter (Signed)
Patient's son called reporting that Chelci is having some hand/feet numbness.  This may be a delayed effect from the chemotherapy, but it is difficult to assess via telephone.  Patient will discuss this symptom with Dr. Mariel Sleet when she is seen in follow-up at the end of Jan 2013.  Kae Lauman

## 2011-11-02 ENCOUNTER — Other Ambulatory Visit (HOSPITAL_COMMUNITY): Payer: Self-pay | Admitting: Oncology

## 2011-11-02 DIAGNOSIS — G629 Polyneuropathy, unspecified: Secondary | ICD-10-CM

## 2011-11-05 ENCOUNTER — Ambulatory Visit (HOSPITAL_COMMUNITY): Payer: Medicare Other

## 2011-11-06 ENCOUNTER — Encounter (HOSPITAL_COMMUNITY): Payer: Medicare Other | Attending: Oncology

## 2011-11-06 ENCOUNTER — Encounter (HOSPITAL_BASED_OUTPATIENT_CLINIC_OR_DEPARTMENT_OTHER): Payer: Medicare Other

## 2011-11-06 DIAGNOSIS — G609 Hereditary and idiopathic neuropathy, unspecified: Secondary | ICD-10-CM | POA: Insufficient documentation

## 2011-11-06 DIAGNOSIS — N289 Disorder of kidney and ureter, unspecified: Secondary | ICD-10-CM

## 2011-11-06 DIAGNOSIS — C9000 Multiple myeloma not having achieved remission: Secondary | ICD-10-CM

## 2011-11-06 DIAGNOSIS — K219 Gastro-esophageal reflux disease without esophagitis: Secondary | ICD-10-CM | POA: Insufficient documentation

## 2011-11-06 DIAGNOSIS — G629 Polyneuropathy, unspecified: Secondary | ICD-10-CM

## 2011-11-06 DIAGNOSIS — D638 Anemia in other chronic diseases classified elsewhere: Secondary | ICD-10-CM | POA: Insufficient documentation

## 2011-11-06 DIAGNOSIS — M810 Age-related osteoporosis without current pathological fracture: Secondary | ICD-10-CM | POA: Insufficient documentation

## 2011-11-06 DIAGNOSIS — D510 Vitamin B12 deficiency anemia due to intrinsic factor deficiency: Secondary | ICD-10-CM | POA: Insufficient documentation

## 2011-11-06 DIAGNOSIS — E538 Deficiency of other specified B group vitamins: Secondary | ICD-10-CM | POA: Insufficient documentation

## 2011-11-06 DIAGNOSIS — N182 Chronic kidney disease, stage 2 (mild): Secondary | ICD-10-CM

## 2011-11-06 DIAGNOSIS — E669 Obesity, unspecified: Secondary | ICD-10-CM

## 2011-11-06 LAB — DIFFERENTIAL
Basophils Absolute: 0 10*3/uL (ref 0.0–0.1)
Eosinophils Absolute: 0.1 10*3/uL (ref 0.0–0.7)
Eosinophils Relative: 1 % (ref 0–5)
Lymphs Abs: 0.7 10*3/uL (ref 0.7–4.0)

## 2011-11-06 LAB — COMPREHENSIVE METABOLIC PANEL
ALT: 13 U/L (ref 0–35)
Calcium: 9.8 mg/dL (ref 8.4–10.5)
GFR calc Af Amer: 18 mL/min — ABNORMAL LOW (ref >90)
Glucose, Bld: 88 mg/dL (ref 70–99)
Sodium: 140 mEq/L (ref 135–145)
Total Protein: 6.9 g/dL (ref 6.0–8.3)

## 2011-11-06 LAB — CBC
MCH: 33.9 pg (ref 26.0–34.0)
MCHC: 33 g/dL (ref 30.0–36.0)
Platelets: 134 10*3/uL — ABNORMAL LOW (ref 150–400)
RDW: 14.9 % (ref 11.5–15.5)

## 2011-11-06 LAB — VITAMIN B12: Vitamin B-12: 634 pg/mL (ref 211–911)

## 2011-11-06 MED ORDER — EPOETIN ALFA 40000 UNIT/ML IJ SOLN
40000.0000 [IU] | Freq: Once | INTRAMUSCULAR | Status: AC
  Administered 2011-11-06: 40000 [IU] via SUBCUTANEOUS

## 2011-11-06 MED ORDER — EPOETIN ALFA 40000 UNIT/ML IJ SOLN
INTRAMUSCULAR | Status: AC
  Filled 2011-11-06: qty 1

## 2011-11-06 NOTE — Progress Notes (Signed)
Labs drawn today for cbc,diff,vb12, folate,cmp,kllc,mm panel

## 2011-11-06 NOTE — Progress Notes (Signed)
Tolerated injection well. 

## 2011-11-07 ENCOUNTER — Encounter: Payer: Self-pay | Admitting: Oncology

## 2011-11-07 LAB — KAPPA/LAMBDA LIGHT CHAINS
Kappa, lambda light chain ratio: 0.51 (ref 0.26–1.65)
Lambda free light chains: 6.12 mg/dL — ABNORMAL HIGH (ref 0.57–2.63)

## 2011-11-08 LAB — MULTIPLE MYELOMA PANEL, SERUM
Albumin ELP: 60 % (ref 55.8–66.1)
Alpha-1-Globulin: 5.8 % — ABNORMAL HIGH (ref 2.9–4.9)
Beta 2: 4.9 % (ref 3.2–6.5)
Beta Globulin: 5.5 % (ref 4.7–7.2)
IgM, Serum: 80 mg/dL (ref 52–322)

## 2011-11-12 ENCOUNTER — Other Ambulatory Visit (HOSPITAL_COMMUNITY): Payer: Self-pay | Admitting: Oncology

## 2011-11-12 ENCOUNTER — Telehealth (HOSPITAL_COMMUNITY): Payer: Self-pay | Admitting: *Deleted

## 2011-11-12 DIAGNOSIS — B49 Unspecified mycosis: Secondary | ICD-10-CM

## 2011-11-12 MED ORDER — CLOTRIMAZOLE-BETAMETHASONE 1-0.05 % EX CREA: TOPICAL_CREAM | Freq: Two times a day (BID) | CUTANEOUS | Status: DC

## 2011-11-12 MED ORDER — FLUCONAZOLE 100 MG PO TABS: 100.0000 mg | ORAL_TABLET | Freq: Every day | ORAL | Status: AC

## 2011-11-16 ENCOUNTER — Ambulatory Visit (HOSPITAL_COMMUNITY): Payer: Medicare Other | Admitting: Oncology

## 2011-11-19 ENCOUNTER — Ambulatory Visit (HOSPITAL_COMMUNITY): Payer: Medicare Other

## 2011-11-20 ENCOUNTER — Encounter (HOSPITAL_BASED_OUTPATIENT_CLINIC_OR_DEPARTMENT_OTHER): Payer: Medicare Other | Admitting: Oncology

## 2011-11-20 ENCOUNTER — Encounter (HOSPITAL_COMMUNITY): Payer: Medicare Other

## 2011-11-20 DIAGNOSIS — D510 Vitamin B12 deficiency anemia due to intrinsic factor deficiency: Secondary | ICD-10-CM

## 2011-11-20 DIAGNOSIS — E538 Deficiency of other specified B group vitamins: Secondary | ICD-10-CM

## 2011-11-20 DIAGNOSIS — D649 Anemia, unspecified: Secondary | ICD-10-CM

## 2011-11-20 DIAGNOSIS — E669 Obesity, unspecified: Secondary | ICD-10-CM

## 2011-11-20 DIAGNOSIS — N182 Chronic kidney disease, stage 2 (mild): Secondary | ICD-10-CM

## 2011-11-20 DIAGNOSIS — K219 Gastro-esophageal reflux disease without esophagitis: Secondary | ICD-10-CM

## 2011-11-20 DIAGNOSIS — C9000 Multiple myeloma not having achieved remission: Secondary | ICD-10-CM

## 2011-11-20 DIAGNOSIS — N289 Disorder of kidney and ureter, unspecified: Secondary | ICD-10-CM

## 2011-11-20 LAB — CBC
HCT: 32.8 % — ABNORMAL LOW (ref 36.0–46.0)
Hemoglobin: 10.8 g/dL — ABNORMAL LOW (ref 12.0–15.0)
MCV: 104.1 fL — ABNORMAL HIGH (ref 78.0–100.0)
RBC: 3.15 MIL/uL — ABNORMAL LOW (ref 3.87–5.11)
WBC: 7.4 10*3/uL (ref 4.0–10.5)

## 2011-11-20 LAB — COMPREHENSIVE METABOLIC PANEL
AST: 16 U/L (ref 0–37)
Albumin: 4.1 g/dL (ref 3.5–5.2)
BUN: 39 mg/dL — ABNORMAL HIGH (ref 6–23)
Creatinine, Ser: 1.94 mg/dL — ABNORMAL HIGH (ref 0.50–1.10)
Total Protein: 7 g/dL (ref 6.0–8.3)

## 2011-11-20 MED ORDER — EPOETIN ALFA 40000 UNIT/ML IJ SOLN
INTRAMUSCULAR | Status: AC
  Administered 2011-11-20: 40000 [IU] via SUBCUTANEOUS
  Filled 2011-11-20: qty 1

## 2011-11-20 MED ORDER — SODIUM CHLORIDE 0.9 % IJ SOLN
INTRAMUSCULAR | Status: AC
  Filled 2011-11-20: qty 10

## 2011-11-20 MED ORDER — CYANOCOBALAMIN 1000 MCG/ML IJ SOLN
INTRAMUSCULAR | Status: AC
  Filled 2011-11-20: qty 1

## 2011-11-20 NOTE — Patient Instructions (Signed)
[name]  [id number] 02-23-1933   [facility]  Discharge Instructions  RECOMMENDATIONS MADE BY THE CONSULTANT AND ANY TEST RESULTS WILL BE SENT TO YOUR REFERRING DOCTOR.   EXAM FINDINGS BY MD TODAY AND SIGNS AND SYMPTOMS TO REPORT TO CLINIC OR PRIMARY MD: Your labs for your myeloma are stable, however your kidney functions are getting a little worse.  We are repeating your kidney functions today and may need to send you to a kidney specialist.  Have your son check your BP daily for 1 week and call us with the readings.  You will not get the zometa today because of your elevated kidney functions.  Medication changes: You can stop the Acyclovir, Folic Acid and the Vitamin E Start taking your iron tablet on Mondays, Wednesdays and Fridays. Take your potassium only on the days that you take the lasix.   INSTRUCTIONS GIVEN AND DISCUSSED: Other :  Report increased bone pain or shortness of breath.  SPECIAL INSTRUCTIONS/FOLLOW-UP: Return to Clinic:  See schedule.   I acknowledge that I have been informed and understand all the instructions given to me and received a copy. I do not have any more questions at this time, but understand that I may call the Specialty Clinic at [facility] at [phone] during business hours should I have any further questions or need assistance in obtaining follow-up care.    __________________________________________  _____________  __________ Signature of Patient or Authorized Representative            Date                   Time    __________________________________________ Nurse's Signature

## 2011-11-20 NOTE — Progress Notes (Signed)
[name] presented for labwork. Labs per MD order drawn via Peripheral Line 23 gauge needle inserted in left antecubital.  Good blood return present. Procedure without incident.  Needle removed intact. Patient tolerated procedure well.  [name] presents today for injection per MD orders. Procrit 40,000 units administered SQ in right Abdomen. Administration without incident. Patient tolerated well.

## 2011-11-20 NOTE — Progress Notes (Signed)
This office note has been dictated.

## 2011-11-20 NOTE — Progress Notes (Signed)
CC:   [name], M.D.  DIAGNOSES: 1. Multiple myeloma, poorly differentiated plasma cell dyscrasia,     presenting with IgA lambda protein, very small M spike, but she did     have a few light chains present. 2. Severe degenerative joint disease and disk disease of the lower     spine with chronic back pain. 3. Chronic leg edema. 4. Mild renal insufficiency. 5. Severe deconditioning which is, I think, better presently. 6. Weakness and fatigue probably from Velcade with a possible     autonomic neuropathy at that time.  [name] has no evidence for an M spike, no evidence for a monoclonal light chain in the urine or serum.  Her labs today, however, did show her creatinine is 1.94 but it is better on lesser Lasix and spironolactone is 2.77 on January 15th.  BUN was 47 on January 15th; it is now 77. Liver functions are fine.  Albumin is 4.1 gm, total protein 7.0. Electrolytes were excellent and her hemoglobin was 10.8 today.  White count 7400, platelets 173,000.  Again, when we checked her on January 15th, she had no M spike, her IgA level was 144 which is low end of normal, IgG was 616 less than the lower end of normal, IgM was 80.  In December, her IgG level was 683, IgA 141, IgM 87.  So, she fluctuates around the normal range for the IgG.  But again, there is no monoclonal spike and no light chain monoclonal protein detected in urine or serum and we did check her 24-hour urine collection at the end of December 2012.  She had good urine volume then of 2155 cc.  She had only 226 mg of total protein per 24 hours and she did have a few light chains seen; but again these were not monoclonal.  Her ankles today are not swollen in spite of the reduction on Lasix and the spironolactone to: 20 mg Lasix Mondays, Wednesdays, and Fridays; and the spironolactone is also down to just 50 mg Mondays, Wednesdays, and Fridays.  We have also decided to eliminate her folic acid since she is  eating well.  She is going to limit her vitamin E since she is eating well. She is going to still take her vitamin D and calcium.  She will take her acyclovir.  She can stop the potassium except on Mondays, Wednesdays, and Fridays.  We may be able to leave off the iron as well.  But, she looks very good today.  She feels better and I think this regimen is working well plus there is no evidence of recurrent disease.  So, we will follow her along.  I will see her in a month.  I want to keep close track on her renal function.  Some of that may be medication induced.    ______________________________ Ladona Horns. Mariel Sleet, MD ESN/MEDQ  D:  11/20/2011  T:  11/20/2011  Job:  [id number]

## 2011-11-22 ENCOUNTER — Telehealth (HOSPITAL_COMMUNITY): Payer: Self-pay | Admitting: *Deleted

## 2011-11-22 NOTE — Telephone Encounter (Signed)
Spoke with pt. Told her Dr.Neijstrom said that her kidney function was somewhat better. Told her to plan for her next visit she will have lab work done, she will get B12 injection, Procrit injection and Zometa infusion. Verbalized understanding.

## 2011-11-22 NOTE — Telephone Encounter (Signed)
Message copied by Dennie Maizes on Thu Nov 22, 2011  2:31 PM ------      Message from: Mariel Sleet, ERIC S      Created: Wed Nov 21, 2011  4:33 PM       Yes!

## 2011-11-23 ENCOUNTER — Other Ambulatory Visit (HOSPITAL_COMMUNITY): Payer: Self-pay | Admitting: Oncology

## 2011-11-23 DIAGNOSIS — G8929 Other chronic pain: Secondary | ICD-10-CM

## 2011-11-23 MED ORDER — OXYCODONE-ACETAMINOPHEN 10-325 MG PO TABS: ORAL_TABLET | ORAL | Status: DC

## 2011-11-23 MED ORDER — HYDROCODONE-ACETAMINOPHEN 10-300 MG PO TABS: 1.0000 | ORAL_TABLET | Freq: Four times a day (QID) | ORAL | Status: DC | PRN

## 2011-11-23 MED ORDER — METHADONE HCL 5 MG PO TABS: 10.0000 mg | ORAL_TABLET | Freq: Two times a day (BID) | ORAL | Status: DC

## 2011-11-26 ENCOUNTER — Other Ambulatory Visit (HOSPITAL_COMMUNITY): Payer: Self-pay | Admitting: Oncology

## 2011-11-26 DIAGNOSIS — I10 Essential (primary) hypertension: Secondary | ICD-10-CM

## 2011-11-26 DIAGNOSIS — F419 Anxiety disorder, unspecified: Secondary | ICD-10-CM

## 2011-11-26 MED ORDER — DIAZEPAM 5 MG PO TABS: 5.0000 mg | ORAL_TABLET | Freq: Four times a day (QID) | ORAL | Status: DC | PRN

## 2011-11-26 MED ORDER — AMLODIPINE BESYLATE 5 MG PO TABS: 5.0000 mg | ORAL_TABLET | Freq: Every day | ORAL | Status: DC

## 2011-11-26 NOTE — Telephone Encounter (Signed)
Blood Pressure readings obtained by son since last Wednesday: Wednesday  162/92 Thursday 140/92 Friday  180/110 Saturday 160/92 Sunday 160/98  Monday 160/90

## 2011-12-03 ENCOUNTER — Ambulatory Visit (HOSPITAL_COMMUNITY): Payer: Medicare Other

## 2011-12-04 ENCOUNTER — Encounter (HOSPITAL_COMMUNITY): Payer: Medicare Other | Attending: Oncology

## 2011-12-04 ENCOUNTER — Ambulatory Visit (HOSPITAL_COMMUNITY): Payer: Medicare Other

## 2011-12-04 ENCOUNTER — Encounter (HOSPITAL_BASED_OUTPATIENT_CLINIC_OR_DEPARTMENT_OTHER): Payer: Medicare Other

## 2011-12-04 DIAGNOSIS — D510 Vitamin B12 deficiency anemia due to intrinsic factor deficiency: Secondary | ICD-10-CM | POA: Insufficient documentation

## 2011-12-04 DIAGNOSIS — E538 Deficiency of other specified B group vitamins: Secondary | ICD-10-CM | POA: Insufficient documentation

## 2011-12-04 DIAGNOSIS — N182 Chronic kidney disease, stage 2 (mild): Secondary | ICD-10-CM

## 2011-12-04 DIAGNOSIS — C9000 Multiple myeloma not having achieved remission: Secondary | ICD-10-CM | POA: Insufficient documentation

## 2011-12-04 DIAGNOSIS — K219 Gastro-esophageal reflux disease without esophagitis: Secondary | ICD-10-CM | POA: Insufficient documentation

## 2011-12-04 DIAGNOSIS — D638 Anemia in other chronic diseases classified elsewhere: Secondary | ICD-10-CM | POA: Insufficient documentation

## 2011-12-04 DIAGNOSIS — E669 Obesity, unspecified: Secondary | ICD-10-CM | POA: Insufficient documentation

## 2011-12-04 DIAGNOSIS — M810 Age-related osteoporosis without current pathological fracture: Secondary | ICD-10-CM | POA: Insufficient documentation

## 2011-12-04 LAB — CBC
MCV: 104.4 fL — ABNORMAL HIGH (ref 78.0–100.0)
Platelets: 154 10*3/uL (ref 150–400)
RDW: 14.9 % (ref 11.5–15.5)
WBC: 7.6 10*3/uL (ref 4.0–10.5)

## 2011-12-04 LAB — COMPREHENSIVE METABOLIC PANEL
ALT: 13 U/L (ref 0–35)
AST: 15 U/L (ref 0–37)
CO2: 21 mEq/L (ref 19–32)
Calcium: 10.8 mg/dL — ABNORMAL HIGH (ref 8.4–10.5)
GFR calc non Af Amer: 23 mL/min — ABNORMAL LOW (ref >90)
Sodium: 138 mEq/L (ref 135–145)
Total Protein: 7.4 g/dL (ref 6.0–8.3)

## 2011-12-04 LAB — DIFFERENTIAL
Basophils Absolute: 0 10*3/uL (ref 0.0–0.1)
Eosinophils Relative: 1 % (ref 0–5)
Lymphocytes Relative: 11 % — ABNORMAL LOW (ref 12–46)
Neutrophils Relative %: 81 % — ABNORMAL HIGH (ref 43–77)

## 2011-12-04 MED ORDER — HEPARIN SOD (PORK) LOCK FLUSH 100 UNIT/ML IV SOLN
500.0000 [IU] | Freq: Once | INTRAVENOUS | Status: AC
  Administered 2011-12-04: 500 [IU] via INTRAVENOUS
  Filled 2011-12-04: qty 5

## 2011-12-04 MED ORDER — CYANOCOBALAMIN 1000 MCG/ML IJ SOLN
INTRAMUSCULAR | Status: AC
  Filled 2011-12-04: qty 1

## 2011-12-04 MED ORDER — ZOLEDRONIC ACID 4 MG/5ML IV CONC
2.0000 mg | Freq: Once | INTRAVENOUS | Status: AC
  Administered 2011-12-04: 2 mg via INTRAVENOUS
  Filled 2011-12-04: qty 2.5

## 2011-12-04 MED ORDER — CYANOCOBALAMIN 1000 MCG/ML IJ SOLN
1000.0000 ug | Freq: Once | INTRAMUSCULAR | Status: AC
  Administered 2011-12-04: 1000 ug via INTRAMUSCULAR

## 2011-12-04 MED ORDER — HEPARIN SOD (PORK) LOCK FLUSH 100 UNIT/ML IV SOLN
INTRAVENOUS | Status: AC
  Filled 2011-12-04: qty 5

## 2011-12-04 NOTE — Progress Notes (Signed)
Tolerated all well. 

## 2011-12-04 NOTE — Progress Notes (Signed)
Labs drawn today for cbc/diff,cmp,kllc,mm panel 

## 2011-12-05 ENCOUNTER — Encounter: Payer: Self-pay | Admitting: Oncology

## 2011-12-05 LAB — KAPPA/LAMBDA LIGHT CHAINS
Kappa free light chain: 2.55 mg/dL — ABNORMAL HIGH (ref 0.33–1.94)
Kappa, lambda light chain ratio: 0.36 (ref 0.26–1.65)
Lambda free light chains: 7.04 mg/dL — ABNORMAL HIGH (ref 0.57–2.63)

## 2011-12-06 ENCOUNTER — Other Ambulatory Visit (HOSPITAL_COMMUNITY): Payer: Self-pay | Admitting: Oncology

## 2011-12-06 LAB — MULTIPLE MYELOMA PANEL, SERUM
Alpha-1-Globulin: 5 % — ABNORMAL HIGH (ref 2.9–4.9)
Alpha-2-Globulin: 13.5 % — ABNORMAL HIGH (ref 7.1–11.8)
Beta 2: 4.9 % (ref 3.2–6.5)
Gamma Globulin: 9.8 % — ABNORMAL LOW (ref 11.1–18.8)

## 2011-12-17 ENCOUNTER — Ambulatory Visit (HOSPITAL_COMMUNITY): Payer: Medicare Other

## 2011-12-18 ENCOUNTER — Encounter (HOSPITAL_COMMUNITY): Payer: Medicare Other

## 2011-12-18 ENCOUNTER — Ambulatory Visit (HOSPITAL_COMMUNITY)
Admission: RE | Admit: 2011-12-18 | Discharge: 2011-12-18 | Disposition: A | Discharge status: Home / Self Care | Payer: Medicare Other | Source: Ambulatory Visit | Attending: Oncology | Admitting: Oncology

## 2011-12-18 ENCOUNTER — Encounter (HOSPITAL_COMMUNITY): Payer: Self-pay | Admitting: Oncology

## 2011-12-18 ENCOUNTER — Encounter (HOSPITAL_BASED_OUTPATIENT_CLINIC_OR_DEPARTMENT_OTHER): Payer: Medicare Other | Admitting: Oncology

## 2011-12-18 DIAGNOSIS — R0602 Shortness of breath: Secondary | ICD-10-CM | POA: Insufficient documentation

## 2011-12-18 DIAGNOSIS — R062 Wheezing: Secondary | ICD-10-CM | POA: Insufficient documentation

## 2011-12-18 DIAGNOSIS — R059 Cough, unspecified: Secondary | ICD-10-CM | POA: Insufficient documentation

## 2011-12-18 DIAGNOSIS — R05 Cough: Secondary | ICD-10-CM | POA: Insufficient documentation

## 2011-12-18 DIAGNOSIS — C9000 Multiple myeloma not having achieved remission: Secondary | ICD-10-CM

## 2011-12-18 LAB — COMPREHENSIVE METABOLIC PANEL
Albumin: 4.4 g/dL (ref 3.5–5.2)
Alkaline Phosphatase: 59 U/L (ref 39–117)
BUN: 57 mg/dL — ABNORMAL HIGH (ref 6–23)
CO2: 22 mEq/L (ref 19–32)
Chloride: 103 mEq/L (ref 96–112)
GFR calc non Af Amer: 25 mL/min — ABNORMAL LOW (ref >90)
Potassium: 4.5 mEq/L (ref 3.5–5.1)
Total Bilirubin: 0.3 mg/dL (ref 0.3–1.2)

## 2011-12-18 LAB — DIFFERENTIAL
Basophils Relative: 1 % (ref 0–1)
Lymphocytes Relative: 10 % — ABNORMAL LOW (ref 12–46)
Lymphs Abs: 0.7 10*3/uL (ref 0.7–4.0)
Monocytes Relative: 8 % (ref 3–12)
Neutro Abs: 5.3 10*3/uL (ref 1.7–7.7)
Neutrophils Relative %: 81 % — ABNORMAL HIGH (ref 43–77)

## 2011-12-18 LAB — CBC
HCT: 35.6 % — ABNORMAL LOW (ref 36.0–46.0)
Hemoglobin: 11.8 g/dL — ABNORMAL LOW (ref 12.0–15.0)
RBC: 3.51 MIL/uL — ABNORMAL LOW (ref 3.87–5.11)
WBC: 6.6 10*3/uL (ref 4.0–10.5)

## 2011-12-18 NOTE — Patient Instructions (Signed)
[name]  [id number] 1933-02-13   [facility]  Discharge Instructions  RECOMMENDATIONS MADE BY THE CONSULTANT AND ANY TEST RESULTS WILL BE SENT TO YOUR REFERRING DOCTOR.   EXAM FINDINGS BY MD TODAY AND SIGNS AND SYMPTOMS TO REPORT TO CLINIC OR PRIMARY MD: need to do chest xray and some additional blood work.  Call us when you get back from the dentist.  MEDICATIONS PRESCRIBED: none      SPECIAL INSTRUCTIONS/FOLLOW-UP: Return to Clinic :  To be arranged    I acknowledge that I have been informed and understand all the instructions given to me and received a copy. I do not have any more questions at this time, but understand that I may call the Specialty Clinic at [facility] at [phone] during business hours should I have any further questions or need assistance in obtaining follow-up care.    __________________________________________  _____________  __________ Signature of Patient or Authorized Representative            Date                   Time    __________________________________________ Nurse's Signature

## 2011-12-18 NOTE — Progress Notes (Signed)
[name] presented for labwork. Labs per MD order drawn via Peripheral Line 23 gauge needle inserted in left AC  Good blood return present. Procedure without incident.  Needle removed intact. Patient tolerated procedure well.

## 2011-12-18 NOTE — Progress Notes (Signed)
[name] presented for labwork. Labs per MD order drawn via Peripheral Line 25 gauge needle inserted in lt ac   Procedure without incident.  Needle removed intact. Patient tolerated procedure well.  Procrit held due to hgs > 11

## 2011-12-19 ENCOUNTER — Encounter (HOSPITAL_BASED_OUTPATIENT_CLINIC_OR_DEPARTMENT_OTHER): Payer: Medicare Other

## 2011-12-19 ENCOUNTER — Ambulatory Visit (HOSPITAL_COMMUNITY): Payer: Medicare Other

## 2011-12-19 DIAGNOSIS — C9000 Multiple myeloma not having achieved remission: Secondary | ICD-10-CM

## 2011-12-19 MED ORDER — SODIUM CHLORIDE 0.9 % IV SOLN
Freq: Once | INTRAVENOUS | Status: AC
  Administered 2011-12-19: 1000 mL via INTRAVENOUS

## 2011-12-21 ENCOUNTER — Other Ambulatory Visit (HOSPITAL_COMMUNITY): Payer: Self-pay | Admitting: Oncology

## 2011-12-21 DIAGNOSIS — F419 Anxiety disorder, unspecified: Secondary | ICD-10-CM

## 2011-12-21 MED ORDER — ALPRAZOLAM 0.5 MG PO TABS: 0.5000 mg | ORAL_TABLET | Freq: Three times a day (TID) | ORAL | Status: DC | PRN

## 2011-12-21 MED ORDER — DIAZEPAM 5 MG PO TABS: 5.0000 mg | ORAL_TABLET | Freq: Four times a day (QID) | ORAL | Status: DC | PRN

## 2011-12-28 ENCOUNTER — Telehealth (HOSPITAL_COMMUNITY): Payer: Self-pay | Admitting: *Deleted

## 2011-12-28 ENCOUNTER — Other Ambulatory Visit (HOSPITAL_COMMUNITY): Payer: Self-pay | Admitting: Oncology

## 2011-12-31 ENCOUNTER — Encounter: Payer: Self-pay | Admitting: Oncology

## 2012-01-01 ENCOUNTER — Other Ambulatory Visit (HOSPITAL_COMMUNITY): Payer: Medicare Other

## 2012-01-01 ENCOUNTER — Ambulatory Visit (HOSPITAL_COMMUNITY): Payer: Medicare Other

## 2012-01-02 ENCOUNTER — Encounter (HOSPITAL_COMMUNITY): Payer: Medicare Other

## 2012-01-02 ENCOUNTER — Encounter (HOSPITAL_BASED_OUTPATIENT_CLINIC_OR_DEPARTMENT_OTHER): Payer: Medicare Other

## 2012-01-02 ENCOUNTER — Encounter (HOSPITAL_COMMUNITY): Payer: Medicare Other | Attending: Oncology

## 2012-01-02 VITALS — BP 121/76 | HR 71 | Temp 97.8°F | Wt 218.6 lb

## 2012-01-02 DIAGNOSIS — M810 Age-related osteoporosis without current pathological fracture: Secondary | ICD-10-CM | POA: Insufficient documentation

## 2012-01-02 DIAGNOSIS — E538 Deficiency of other specified B group vitamins: Secondary | ICD-10-CM

## 2012-01-02 DIAGNOSIS — D638 Anemia in other chronic diseases classified elsewhere: Secondary | ICD-10-CM

## 2012-01-02 DIAGNOSIS — D510 Vitamin B12 deficiency anemia due to intrinsic factor deficiency: Secondary | ICD-10-CM

## 2012-01-02 DIAGNOSIS — C9000 Multiple myeloma not having achieved remission: Secondary | ICD-10-CM

## 2012-01-02 LAB — COMPREHENSIVE METABOLIC PANEL
AST: 14 U/L (ref 0–37)
Albumin: 3.9 g/dL (ref 3.5–5.2)
Calcium: 9.5 mg/dL (ref 8.4–10.5)
Creatinine, Ser: 1.63 mg/dL — ABNORMAL HIGH (ref 0.50–1.10)

## 2012-01-02 LAB — DIFFERENTIAL
Basophils Absolute: 0 10*3/uL (ref 0.0–0.1)
Basophils Relative: 0 % (ref 0–1)
Eosinophils Absolute: 0.1 10*3/uL (ref 0.0–0.7)
Eosinophils Relative: 1 % (ref 0–5)
Monocytes Absolute: 0.9 10*3/uL (ref 0.1–1.0)

## 2012-01-02 LAB — CBC
HCT: 30.6 % — ABNORMAL LOW (ref 36.0–46.0)
MCHC: 34 g/dL (ref 30.0–36.0)
MCV: 101.7 fL — ABNORMAL HIGH (ref 78.0–100.0)
RDW: 14.2 % (ref 11.5–15.5)

## 2012-01-02 MED ORDER — CYANOCOBALAMIN 1000 MCG/ML IJ SOLN
1000.0000 ug | Freq: Once | INTRAMUSCULAR | Status: AC
  Administered 2012-01-02: 1000 ug via INTRAMUSCULAR

## 2012-01-02 MED ORDER — HEPARIN SOD (PORK) LOCK FLUSH 100 UNIT/ML IV SOLN
INTRAVENOUS | Status: AC
  Administered 2012-01-02: 500 [IU]
  Filled 2012-01-02: qty 5

## 2012-01-02 MED ORDER — SODIUM CHLORIDE 0.9 % IV SOLN: Freq: Once | INTRAVENOUS | Status: DC

## 2012-01-02 MED ORDER — HEPARIN SOD (PORK) LOCK FLUSH 100 UNIT/ML IV SOLN
500.0000 [IU] | Freq: Once | INTRAVENOUS | Status: AC | PRN
  Administered 2012-01-02: 500 [IU]
  Filled 2012-01-02: qty 5

## 2012-01-02 MED ORDER — ZOLEDRONIC ACID 4 MG/5ML IV CONC
2.0000 mg | Freq: Once | INTRAVENOUS | Status: AC
  Administered 2012-01-02: 2 mg via INTRAVENOUS
  Filled 2012-01-02: qty 2.5

## 2012-01-02 MED ORDER — CYANOCOBALAMIN 1000 MCG/ML IJ SOLN
INTRAMUSCULAR | Status: AC
  Filled 2012-01-02: qty 1

## 2012-01-02 MED ORDER — SODIUM CHLORIDE 0.9 % IJ SOLN
10.0000 mL | INTRAMUSCULAR | Status: DC | PRN
  Filled 2012-01-02: qty 10

## 2012-01-02 MED ORDER — EPOETIN ALFA 40000 UNIT/ML IJ SOLN
40000.0000 [IU] | Freq: Once | INTRAMUSCULAR | Status: AC
  Administered 2012-01-02: 40000 [IU] via SUBCUTANEOUS
  Filled 2012-01-02: qty 1

## 2012-01-02 NOTE — Progress Notes (Signed)
Labs drawn today for cbc/diff,cmp,mm panel,kllc 

## 2012-01-02 NOTE — Progress Notes (Signed)
See infusion encounter.  

## 2012-01-02 NOTE — Progress Notes (Signed)
[name] presents today for injection per MD orders. B12 1,077mcg administered SQ in left Upper Arm.  Zometa 2mg  given IVPB.  Procrit 40,000 units given subcutaneous to abdomen. Administration without incident. Patient tolerated well.

## 2012-01-02 NOTE — Progress Notes (Signed)
Tolerated well

## 2012-01-03 LAB — KAPPA/LAMBDA LIGHT CHAINS
Kappa, lambda light chain ratio: 0.2 — ABNORMAL LOW (ref 0.26–1.65)
Lambda free light chains: 7.34 mg/dL — ABNORMAL HIGH (ref 0.57–2.63)

## 2012-01-04 LAB — MULTIPLE MYELOMA PANEL, SERUM
Albumin ELP: 58.8 % (ref 55.8–66.1)
Alpha-1-Globulin: 5.7 % — ABNORMAL HIGH (ref 2.9–4.9)
Beta 2: 5.2 % (ref 3.2–6.5)
Beta Globulin: 5.6 % (ref 4.7–7.2)
IgA: 168 mg/dL (ref 69–380)
IgM, Serum: 96 mg/dL (ref 52–322)

## 2012-01-15 ENCOUNTER — Encounter (HOSPITAL_BASED_OUTPATIENT_CLINIC_OR_DEPARTMENT_OTHER): Payer: Medicare Other | Admitting: Oncology

## 2012-01-15 DIAGNOSIS — M549 Dorsalgia, unspecified: Secondary | ICD-10-CM

## 2012-01-15 DIAGNOSIS — C9000 Multiple myeloma not having achieved remission: Secondary | ICD-10-CM

## 2012-01-15 DIAGNOSIS — R5381 Other malaise: Secondary | ICD-10-CM

## 2012-01-15 DIAGNOSIS — N289 Disorder of kidney and ureter, unspecified: Secondary | ICD-10-CM

## 2012-01-15 NOTE — Progress Notes (Signed)
This office note has been dictated.

## 2012-01-15 NOTE — Progress Notes (Signed)
CC:   [name], M.D.  DIAGNOSES: 1. Recent progression of her multiple myeloma.  She has poorly     differentiated myeloma with minimal IgA lambda M-spike.  She     presented with a right scalp mass in November 2007 and this very     faint M-spike.  She did have mild Bence-Jones proteinuria and, with     therapy, both disappeared.  She had a relapse last year treated     with 3 doses of Doxil though the drug was in short supply.  She did     not get full dose.  I gave her 1 cycle of epirubicin and Cytoxan     since the drug became unavailable completely to [place] and, therefore,     I want her re-evaluated to see if we can give her full-dose Doxil     since she did have a very nice response.  She will see Dr. Diona Hartman     in [facility] for that consultation. 2. Severe deconditioning. 3. Renal insufficiency that is mild. 4. Weakness and fatigue from Velcade with some element of autonomic     neuropathy. 5. Revlimid-induced skin rash which had to be stopped. 6. Thalidomide-induced weakness and fatigue which we had to stop as of     May 2011. 7. Degenerative joint and disk disease of her spine along with     myelomatous damage of her spine with compression fractures causing     chronic back pain, for which she is on hydrocodone 10/325 mg.  She     is also, of course, on the Zometa for her bones from the myeloma     damage. [name] unfortunately had labs last week.  The M-spike was barely detectable once again indicating recurrence of disease.  Her M-spike was only 110 mg/dL, very small spike but real.  It was still IgA lambda protein.  So after last year's therapy and its toxicity, she has not been on anything realistically for her myeloma since 12/11/2010.  In 2011, she had received again 3 doses of Doxil, the 1st dose was 50 mg, that is all we could obtain, the 2nd dose was 80 mg, and the 3rd dose was 60 mg and then again we gave her 1 cycle of epirubicin and Cytoxan  and she had complete disappearance of her M-spike and her lambda light chains monoclonal protein as well.  So she has been doing very well this past 12 months until now.  She still feels decent except for the back pain which is fairly well controlled with the hydrocodone.  Her legs with the edema that we had to deal with last year are doing very well.  They are minimally puffy to no puffiness lately.  So before I re-initiate therapy with her toxicity from these other drugs, I would like to not have to throw away Doxil if I do not have to, but I would like her to see one of the cardiologist and she would like to see Dr. Diona Hartman for this evaluation before we start.  If we have to, we can use 1 of the new drugs, carfilzomib or pomalidomide if we have to, and if we use Decadron, I think we ought to limit it to four 4-mg tablets b.i.d. rather than 5 just once a week. Her son and daughter are with her today and I will see her back after that workup.   ______________________________ [name]. Sheryl Sleet, MD ESN/MEDQ  D:  01/15/2012  T:  01/15/2012  Job:  [id number]

## 2012-01-15 NOTE — Patient Instructions (Addendum)
[name]  [id number] 1933/09/03   [facility]  Discharge Instructions  RECOMMENDATIONS MADE BY THE CONSULTANT AND ANY TEST RESULTS WILL BE SENT TO YOUR REFERRING DOCTOR.   EXAM FINDINGS BY MD TODAY AND SIGNS AND SYMPTOMS TO REPORT TO CLINIC OR PRIMARY MD: Your myeloma has become active again.  We need to try to treat you with Doxil again, but need to get clearance from the cardiologist to proceed.  MEDICATIONS PRESCRIBED: Refill for hydrocodone called into [facility]  100 tablets with 1 refill Follow label directions  INSTRUCTIONS GIVEN AND DISCUSSED:   SPECIAL INSTRUCTIONS/FOLLOW-UP: Return to Clinic in 2 weeks. Cardiology Consult with Dr. Diona Browner in [place] on 4/3 at 2:20pm   I acknowledge that I have been informed and understand all the instructions given to me and received a copy. I do not have any more questions at this time, but understand that I may call the Specialty Clinic at [facility] at [phone] during business hours should I have any further questions or need assistance in obtaining follow-up care.    __________________________________________  _____________  __________ Signature of Patient or Authorized Representative            Date                   Time    __________________________________________ Nurse's Signature

## 2012-01-21 ENCOUNTER — Encounter: Payer: Self-pay | Admitting: Oncology

## 2012-01-22 ENCOUNTER — Ambulatory Visit (HOSPITAL_COMMUNITY): Payer: Medicare Other

## 2012-01-23 ENCOUNTER — Encounter: Payer: Self-pay | Admitting: Cardiology

## 2012-01-23 ENCOUNTER — Ambulatory Visit (INDEPENDENT_AMBULATORY_CARE_PROVIDER_SITE_OTHER): Payer: Medicare Other | Admitting: Cardiology

## 2012-01-23 ENCOUNTER — Telehealth (HOSPITAL_COMMUNITY): Payer: Self-pay

## 2012-01-23 DIAGNOSIS — R609 Edema, unspecified: Secondary | ICD-10-CM

## 2012-01-23 DIAGNOSIS — C9000 Multiple myeloma not having achieved remission: Secondary | ICD-10-CM

## 2012-01-23 DIAGNOSIS — I428 Other cardiomyopathies: Secondary | ICD-10-CM

## 2012-01-23 DIAGNOSIS — I10 Essential (primary) hypertension: Secondary | ICD-10-CM

## 2012-01-23 DIAGNOSIS — I429 Cardiomyopathy, unspecified: Secondary | ICD-10-CM | POA: Insufficient documentation

## 2012-01-23 NOTE — Assessment & Plan Note (Signed)
Treated with Norvasc, followed by Dr. Regino Schultze.

## 2012-01-23 NOTE — Assessment & Plan Note (Signed)
Dr. Mariel Sleet is considering treatment with Doxil as outlined. Plan followup echocardiogram prior to this. Potential adverse reactions include cardiotoxicity and CHF. This will need to be taken into consideration. Patient states that she tolerated Doxil in the past.

## 2012-01-23 NOTE — Progress Notes (Signed)
Clinical Summary [name] is a medically complex 78 y.o.female referred to the office by Dr. Mariel Sleet for prechemotherapy evaluation. She is a former patient of Dr. Dietrich Pates. Dr. Thornton Papas recent office note was reviewed. She is being considered for treatment with Doxil in light of progression in multiple myeloma. Previous treatments have included epirubucun and Cytoxan.  From a cardiac perspective records indicate possible underlying CAD based on wall motion abnormality on previous echocardiogram, overall relatively preserved ejection fraction however with some component of diastolic dysfunction. I was able to review an echocardiogram report from December 2011 which documented LVEF of 45-50% with diffuse hypokinesis, grade 1 diastolic dysfunction, mild aortic and mitral regurgitation, PASP 34 mm mercury.  She reports no chest pain or progressive shortness of breath. She is functionally limited, uses a walker, has chronic problems with leg discomfort, edema, and paresthesia. She denies any orthopnea or PND.  Followup ECG is reviewed below.  She recalls tolerating Doxil well when she received this years ago.   Allergies  Allergen Reactions  . Revlimid Rash    Current Outpatient Prescriptions  Medication Sig Dispense Refill  . amLODipine (NORVASC) 5 MG tablet Take 1 tablet (5 mg total) by mouth daily.  30 tablet  1  . Cholecalciferol (VITAMIN D3) 1000 UNITS CAPS Take 1 capsule by mouth daily.      . clotrimazole-betamethasone (LOTRISONE) cream Apply topically 2 (two) times daily.  30 g  0  . diazepam (VALIUM) 5 MG tablet Take 1 tablet (5 mg total) by mouth every 6 (six) hours as needed for anxiety or sleep. For sleep/anxiety  60 tablet  0  . docusate sodium (COLACE) 100 MG capsule Take 100 mg by mouth 2 (two) times daily.        . folic acid (FOLVITE) 1 MG tablet Take 1 mg by mouth daily.        . furosemide (LASIX) 20 MG tablet Take 20 mg by mouth 3 (three) times a week. 1/4 pill MWF       . Hydrocodone-Acetaminophen 10-300 MG TABS Take 1 tablet by mouth every 6 (six) hours as needed.  90 each  0  . iron polysaccharides (NU-IRON) 150 MG capsule Take 150 mg by mouth daily.        Marland Kitchen omeprazole (PRILOSEC) 20 MG capsule Take 1 capsule (20 mg total) by mouth daily.  30 capsule  4  . ondansetron (ZOFRAN) 8 MG tablet Take 8 mg by mouth every 8 (eight) hours as needed.      . potassium chloride SA (K-DUR,KLOR-CON) 20 MEQ tablet Take 20 mEq by mouth daily.       . predniSONE (DELTASONE) 5 MG tablet Take 5 mg by mouth daily.        Marland Kitchen spironolactone (ALDACTONE) 50 MG tablet Take 1 tablet (50 mg total) by mouth every Monday, Wednesday, and Friday.  30 tablet  2    Past Medical History  Diagnosis Date  . Essential hypertension, benign     LVH; RVH; normal EF in 04/2011 with the basilar inferior wall akinesis  . Edema   . Obesity   . Osteoporosis     Lumbar and thoracic compression fractures  . Multiple myeloma 2007    IgA lambda  . Gastroesophageal reflux disease   . Pernicious anemia     Plus thrombocytopenia;H./H.-9.8/29 in 12/2010  . Chronic kidney disease, stage 2, mildly decreased GFR     Creatinine of 1.3 in 09/2010; acute renal failure in 11/2010 secondary to  diuretics with hyperkalemia secondary to Spiriva lactone  . Peripheral neuropathy     Complicating chemotherapy  . Chronic low back pain   . Chronic steroid use   . History of candidiasis   . Allergy     Revlimid  . Anemia of chronic disease   . B12 deficiency     Past Surgical History  Procedure Date  . Total abdominal hysterectomy w/ bilateral salpingoophorectomy     Incidental appendectomy  . Rotator cuff repair     Left  . Vein surgery     Ligation and stripping bilaterally  . Orif ankle fracture     Right  . Carpal tunnel release     Right  . Central venous catheter tunneled insertion single lumen 2012    Family History  Problem Relation Age of Onset  . Heart attack Brother     Social  History Ms. Weightman reports that she has never smoked. She has never used smokeless tobacco. Ms. Goda reports that she does not drink alcohol.  Review of Systems No fevers or chills, no cough or hemoptysis. Good appetite. Reports sodium restriction. She elevates her feet at night, was not able to tolerate compression hose. Otherwise reviewed and negative.  Physical Examination Filed Vitals:   01/23/12 1338  BP: 140/61  Pulse: 91   Obese woman in no acute distress. HEENT: Conjunctiva and lids normal, oropharynx clear with moist mucosa. Neck: Supple, no elevated JVP or carotid bruits, no thyromegaly. Lungs: Clear to auscultation, nonlabored breathing at rest. Cardiac: Regular rate and rhythm with ectopic beats, no S3, soft systolic murmur, no pericardial rub. Abdomen: Soft, nontender, protuberant, bowel sounds present, no guarding or rebound. Extremities: Chronic appearing edema, distal pulses 1-2+. Skin: Warm and dry. Musculoskeletal: No kyphosis. Neuropsychiatric: Alert and oriented x3, affect grossly appropriate.   ECG Sinus rhythm at 92 with LVH, left anterior fascicular block, decreased R-wave progression although no definitive evidence of anterior infarct.     Problem List and Plan

## 2012-01-23 NOTE — Patient Instructions (Signed)
Follow up in 1 month in [place]. Your physician recommends that you continue on your current medications as directed. Please refer to the Current Medication list given to you today. Your physician has requested that you have an echocardiogram. Echocardiography is a painless test that uses sound waves to create images of your heart. It provides your doctor with information about the size and shape of your heart and how well your heart's chambers and valves are working. This procedure takes approximately one hour. There are no restrictions for this procedure.

## 2012-01-23 NOTE — Assessment & Plan Note (Signed)
Chronic in nature. Patient states it is better than has been in the past. She is on chronic diuretics with potassium replacement. Recent lab work from March showed creatinine 1.6, potassium 4.0.

## 2012-01-23 NOTE — Telephone Encounter (Signed)
  Call from patient requesting something stronger for sleep.  Has been taking Diazepam 5 mg and has not helped.  Discussed with Dr. Mariel Sleet and prescription for Diazepam 5 mg # 75 to take 1 by mouth every 6 hours as needed for anxiety during the day and 1 or 2 tablets at bedtime as needed for sleep called into [place].  Patient notified.

## 2012-01-23 NOTE — Assessment & Plan Note (Signed)
Records reviewed including most recent echocardiogram from December 2011. Patient does have evidence of mild LV dysfunction around that time, although relatively global hypokinesis. Could not find clear documentation of focal wall motion abnormalities or definitive evidence of CAD. Her ECG is abnormal although in setting of LVH with left anterior fascicular block. She denies any exertional chest pain or progressive shortness of breath. Plan at this point is to proceed with a followup echocardiogram. Followup arranged.

## 2012-01-24 ENCOUNTER — Encounter (HOSPITAL_COMMUNITY): Payer: Medicare Other

## 2012-01-24 ENCOUNTER — Encounter (HOSPITAL_COMMUNITY): Payer: Medicare Other | Attending: Oncology

## 2012-01-24 DIAGNOSIS — C9000 Multiple myeloma not having achieved remission: Secondary | ICD-10-CM | POA: Insufficient documentation

## 2012-01-24 DIAGNOSIS — D638 Anemia in other chronic diseases classified elsewhere: Secondary | ICD-10-CM

## 2012-01-24 LAB — CBC
MCH: 33.3 pg (ref 26.0–34.0)
MCV: 101.3 fL — ABNORMAL HIGH (ref 78.0–100.0)
Platelets: 153 10*3/uL (ref 150–400)
RBC: 3.12 MIL/uL — ABNORMAL LOW (ref 3.87–5.11)

## 2012-01-24 LAB — DIFFERENTIAL
Basophils Absolute: 0 10*3/uL (ref 0.0–0.1)
Basophils Relative: 1 % (ref 0–1)
Eosinophils Relative: 1 % (ref 0–5)
Lymphs Abs: 0.8 10*3/uL (ref 0.7–4.0)
Monocytes Relative: 8 % (ref 3–12)

## 2012-01-24 MED ORDER — EPOETIN ALFA 40000 UNIT/ML IJ SOLN
INTRAMUSCULAR | Status: AC
  Filled 2012-01-24: qty 1

## 2012-01-24 MED ORDER — EPOETIN ALFA 40000 UNIT/ML IJ SOLN
40000.0000 [IU] | Freq: Once | INTRAMUSCULAR | Status: AC
  Administered 2012-01-24: 40000 [IU] via SUBCUTANEOUS

## 2012-01-24 NOTE — Progress Notes (Signed)
Witney A Wisner presents today for injection per MD orders. Procrit 40,000 units administered SQ in right Abdomen. Administration without incident. Patient tolerated well.  

## 2012-01-25 NOTE — Progress Notes (Signed)
Lab draw

## 2012-01-28 ENCOUNTER — Other Ambulatory Visit (HOSPITAL_COMMUNITY): Payer: Medicare Other

## 2012-01-28 ENCOUNTER — Other Ambulatory Visit (HOSPITAL_COMMUNITY): Payer: Self-pay | Admitting: Oncology

## 2012-01-28 ENCOUNTER — Ambulatory Visit (HOSPITAL_COMMUNITY)
Admission: RE | Admit: 2012-01-28 | Discharge: 2012-01-28 | Disposition: A | Discharge status: Home / Self Care | Payer: Medicare Other | Source: Ambulatory Visit | Attending: Cardiology | Admitting: Cardiology

## 2012-01-28 DIAGNOSIS — I359 Nonrheumatic aortic valve disorder, unspecified: Secondary | ICD-10-CM

## 2012-01-28 DIAGNOSIS — I10 Essential (primary) hypertension: Secondary | ICD-10-CM | POA: Insufficient documentation

## 2012-01-28 DIAGNOSIS — I428 Other cardiomyopathies: Secondary | ICD-10-CM | POA: Insufficient documentation

## 2012-01-28 DIAGNOSIS — I2510 Atherosclerotic heart disease of native coronary artery without angina pectoris: Secondary | ICD-10-CM | POA: Insufficient documentation

## 2012-01-28 DIAGNOSIS — C9000 Multiple myeloma not having achieved remission: Secondary | ICD-10-CM | POA: Insufficient documentation

## 2012-01-28 MED ORDER — AMLODIPINE BESYLATE 5 MG PO TABS: 5.0000 mg | ORAL_TABLET | Freq: Every day | ORAL | Status: DC

## 2012-01-28 NOTE — Progress Notes (Signed)
*  PRELIMINARY RESULTS* Echocardiogram 2D Echocardiogram has been performed.  Conrad Earlton 01/28/2012, 2:24 PM

## 2012-01-29 ENCOUNTER — Telehealth (HOSPITAL_COMMUNITY): Payer: Self-pay

## 2012-01-29 DIAGNOSIS — R609 Edema, unspecified: Secondary | ICD-10-CM

## 2012-01-29 MED ORDER — SPIRONOLACTONE 50 MG PO TABS: 50.0000 mg | ORAL_TABLET | Freq: Three times a day (TID) | ORAL | Status: DC

## 2012-01-29 NOTE — Telephone Encounter (Signed)
Call from [place] with complaints of increased swelling in both lower legs and feet with left worse than right for several days.  States " they hurt so bad from the swelling that I can't hardly walk.  I'm taking the Spironolactone 50 mg and Lasix 20 mg on Mondays, Wednesdays and Fridays.  I'm also keeping my legs elevated but doesn't seem to help much.   Want to know what else I can do."

## 2012-01-29 NOTE — Telephone Encounter (Signed)
Patient notified to increase spironolactone to TID and to leave lasix @ 20 mg on Mondays, Wednesdays and Fridays.  Per patient she will need new prescription called into [place], only has a few of the spironolactone left.

## 2012-01-29 NOTE — Telephone Encounter (Signed)
Message copied by Sterling Big on Tue Jan 29, 2012  5:03 PM ------      Message from: Mariel Sleet, ERIC S      Created: Tue Jan 29, 2012  2:23 PM       Increase spironolactone to TID       Lasix stays same.

## 2012-02-05 ENCOUNTER — Ambulatory Visit (HOSPITAL_COMMUNITY): Payer: Medicare Other | Admitting: Oncology

## 2012-02-05 ENCOUNTER — Other Ambulatory Visit (HOSPITAL_COMMUNITY): Payer: Self-pay | Admitting: Oncology

## 2012-02-05 ENCOUNTER — Ambulatory Visit (HOSPITAL_COMMUNITY): Payer: Medicare Other

## 2012-02-06 ENCOUNTER — Encounter (HOSPITAL_COMMUNITY): Payer: Self-pay | Admitting: Oncology

## 2012-02-06 ENCOUNTER — Encounter (HOSPITAL_BASED_OUTPATIENT_CLINIC_OR_DEPARTMENT_OTHER): Payer: Medicare Other | Admitting: Oncology

## 2012-02-06 ENCOUNTER — Encounter (HOSPITAL_BASED_OUTPATIENT_CLINIC_OR_DEPARTMENT_OTHER): Payer: Medicare Other

## 2012-02-06 DIAGNOSIS — C9000 Multiple myeloma not having achieved remission: Secondary | ICD-10-CM

## 2012-02-06 DIAGNOSIS — Z5111 Encounter for antineoplastic chemotherapy: Secondary | ICD-10-CM

## 2012-02-06 DIAGNOSIS — F411 Generalized anxiety disorder: Secondary | ICD-10-CM

## 2012-02-06 DIAGNOSIS — R609 Edema, unspecified: Secondary | ICD-10-CM

## 2012-02-06 DIAGNOSIS — R197 Diarrhea, unspecified: Secondary | ICD-10-CM

## 2012-02-06 LAB — COMPREHENSIVE METABOLIC PANEL
ALT: 12 U/L (ref 0–35)
Albumin: 3.6 g/dL (ref 3.5–5.2)
Alkaline Phosphatase: 44 U/L (ref 39–117)
Potassium: 3.4 mEq/L — ABNORMAL LOW (ref 3.5–5.1)
Sodium: 139 mEq/L (ref 135–145)
Total Protein: 6.3 g/dL (ref 6.0–8.3)

## 2012-02-06 LAB — DIFFERENTIAL
Basophils Absolute: 0 10*3/uL (ref 0.0–0.1)
Basophils Relative: 0 % (ref 0–1)
Eosinophils Absolute: 0.1 10*3/uL (ref 0.0–0.7)
Lymphs Abs: 0.6 10*3/uL — ABNORMAL LOW (ref 0.7–4.0)
Neutrophils Relative %: 77 % (ref 43–77)

## 2012-02-06 LAB — CBC
MCH: 32.7 pg (ref 26.0–34.0)
Platelets: 157 10*3/uL (ref 150–400)
RBC: 2.84 MIL/uL — ABNORMAL LOW (ref 3.87–5.11)
RDW: 14.7 % (ref 11.5–15.5)
WBC: 5.3 10*3/uL (ref 4.0–10.5)

## 2012-02-06 MED ORDER — SODIUM CHLORIDE 0.9 % IV SOLN: Freq: Once | INTRAVENOUS | Status: DC

## 2012-02-06 MED ORDER — ALPRAZOLAM 0.5 MG PO TABS: 0.5000 mg | ORAL_TABLET | Freq: Every evening | ORAL | Status: AC | PRN

## 2012-02-06 MED ORDER — DEXAMETHASONE SODIUM PHOSPHATE 10 MG/ML IJ SOLN: 10.0000 mg | Freq: Once | INTRAMUSCULAR | Status: DC

## 2012-02-06 MED ORDER — SODIUM CHLORIDE 0.9 % IV SOLN
Freq: Once | INTRAVENOUS | Status: AC
  Administered 2012-02-06: 8 mg via INTRAVENOUS
  Filled 2012-02-06: qty 4

## 2012-02-06 MED ORDER — HEPARIN SOD (PORK) LOCK FLUSH 100 UNIT/ML IV SOLN
500.0000 [IU] | Freq: Once | INTRAVENOUS | Status: AC | PRN
  Administered 2012-02-06: 500 [IU]
  Filled 2012-02-06: qty 5

## 2012-02-06 MED ORDER — SODIUM CHLORIDE 0.9 % IJ SOLN
10.0000 mL | INTRAMUSCULAR | Status: DC | PRN
  Filled 2012-02-06: qty 10

## 2012-02-06 MED ORDER — DOXORUBICIN HCL LIPOSOMAL CHEMO INJECTION 2 MG/ML
40.0000 mg/m2 | Freq: Once | INTRAVENOUS | Status: AC
  Administered 2012-02-06: 84 mg via INTRAVENOUS
  Filled 2012-02-06: qty 42

## 2012-02-06 MED ORDER — HEPARIN SOD (PORK) LOCK FLUSH 100 UNIT/ML IV SOLN
INTRAVENOUS | Status: AC
  Filled 2012-02-06: qty 5

## 2012-02-06 MED ORDER — SODIUM CHLORIDE 0.9 % IV SOLN: 8.0000 mg | Freq: Once | INTRAVENOUS | Status: DC

## 2012-02-06 NOTE — Patient Instructions (Addendum)
[name]  [id number] 29-Sep-1933 Dr. Glenford Peers   [facility]  Discharge Instructions  RECOMMENDATIONS MADE BY THE CONSULTANT AND ANY TEST RESULTS WILL BE SENT TO YOUR REFERRING DOCTOR.   EXAM FINDINGS BY MD TODAY AND SIGNS AND SYMPTOMS TO REPORT TO CLINIC OR PRIMARY MD: We will start your chemotherapy.  MEDICATIONS PRESCRIBED: Xanax 0.5 at bedtime as needed for sleep.  Can take 1 during the day as needed for muscle relaxation and 2 at bedtime as needed for sleep Follow label directions and May cause drowsiness, do not operate machinery while taking this medication.  Do not take valium if you take xanax.  INSTRUCTIONS GIVEN AND DISCUSSED: Other :  Increase Spironolactone to 1 tablet 4 times daily and your lasix to 1/2 tablet daily until we can get your swelling down then we will adjust your doses.  SPECIAL INSTRUCTIONS/FOLLOW-UP: Return to Clinic : to see PA in 1 month.   I acknowledge that I have been informed and understand all the instructions given to me and received a copy. I do not have any more questions at this time, but understand that I may call the Specialty [facility] at [facility] at [phone] during business hours should I have any further questions or need assistance in obtaining follow-up care.    __________________________________________  _____________  __________ Signature of Patient or Authorized Representative            Date                   Time    __________________________________________ Nurse's Signature

## 2012-02-06 NOTE — Progress Notes (Signed)
IgA lambda multiple myeloma now with recurrent disease. We're starting Doxil today and we'll try this for 3 cycles. Her ejection fraction was perfectly intact. Unfortunately she's had recurrence of her leg swelling bilaterally left greater than right but it's essentially 3-4+ edema. We will go back up on the spironolactone to 50 mg 4 times a day and increase her Lasix to a half a tablet everyday. Once she is better we'll start tapering her back to a 3 times a day schedule with the spironolactone and a quarter of a tablet of the Lasix.  She just hasn't felt well in the last 2 weeks still has severe deconditioning and fell last week. She also had multiple episodes of watery diarrhea on Monday which tapered off by Tuesday morning.  We will hydrate her somewhat today and initiate therapy. The excavated area on the right side of her head is still the same. She does however looks somewhat pale today. One of [place] will see her in 4 weeks and she has asked for Xanax today since the Valium even 10 mg at bedtime is not helping her. I will call in Xanax 0.5 mg once during the day and 1or2 at nighttime to help her relax.

## 2012-02-07 ENCOUNTER — Telehealth (HOSPITAL_COMMUNITY): Payer: Self-pay | Admitting: Oncology

## 2012-02-07 LAB — KAPPA/LAMBDA LIGHT CHAINS
Kappa free light chain: 1.52 mg/dL (ref 0.33–1.94)
Lambda free light chains: 12 mg/dL — ABNORMAL HIGH (ref 0.57–2.63)

## 2012-02-12 ENCOUNTER — Ambulatory Visit (HOSPITAL_COMMUNITY): Payer: Medicare Other | Admitting: Oncology

## 2012-02-12 LAB — MULTIPLE MYELOMA PANEL, SERUM
Alpha-2-Globulin: 14.2 % — ABNORMAL HIGH (ref 7.1–11.8)
Beta Globulin: 5.5 % (ref 4.7–7.2)
Gamma Globulin: 9.7 % — ABNORMAL LOW (ref 11.1–18.8)
IgA: 166 mg/dL (ref 69–380)
M-Spike, %: 0.18 g/dL

## 2012-02-20 ENCOUNTER — Encounter (HOSPITAL_BASED_OUTPATIENT_CLINIC_OR_DEPARTMENT_OTHER): Payer: Medicare Other

## 2012-02-20 ENCOUNTER — Encounter (HOSPITAL_COMMUNITY): Payer: Medicare Other | Attending: Oncology

## 2012-02-20 ENCOUNTER — Ambulatory Visit (INDEPENDENT_AMBULATORY_CARE_PROVIDER_SITE_OTHER): Payer: Medicare Other | Admitting: Cardiology

## 2012-02-20 ENCOUNTER — Encounter: Payer: Self-pay | Admitting: Cardiology

## 2012-02-20 ENCOUNTER — Ambulatory Visit: Payer: Medicare Other | Admitting: Cardiology

## 2012-02-20 VITALS — BP 128/77 | HR 63

## 2012-02-20 DIAGNOSIS — E538 Deficiency of other specified B group vitamins: Secondary | ICD-10-CM

## 2012-02-20 DIAGNOSIS — M545 Low back pain, unspecified: Secondary | ICD-10-CM | POA: Insufficient documentation

## 2012-02-20 DIAGNOSIS — D510 Vitamin B12 deficiency anemia due to intrinsic factor deficiency: Secondary | ICD-10-CM | POA: Insufficient documentation

## 2012-02-20 DIAGNOSIS — M810 Age-related osteoporosis without current pathological fracture: Secondary | ICD-10-CM | POA: Insufficient documentation

## 2012-02-20 DIAGNOSIS — G8929 Other chronic pain: Secondary | ICD-10-CM | POA: Insufficient documentation

## 2012-02-20 DIAGNOSIS — D649 Anemia, unspecified: Secondary | ICD-10-CM

## 2012-02-20 DIAGNOSIS — C9000 Multiple myeloma not having achieved remission: Secondary | ICD-10-CM

## 2012-02-20 DIAGNOSIS — I429 Cardiomyopathy, unspecified: Secondary | ICD-10-CM

## 2012-02-20 LAB — DIFFERENTIAL
Basophils Absolute: 0 10*3/uL (ref 0.0–0.1)
Eosinophils Relative: 1 % (ref 0–5)
Lymphocytes Relative: 6 % — ABNORMAL LOW (ref 12–46)
Neutro Abs: 4.2 10*3/uL (ref 1.7–7.7)
Neutrophils Relative %: 92 % — ABNORMAL HIGH (ref 43–77)

## 2012-02-20 LAB — COMPREHENSIVE METABOLIC PANEL
ALT: 15 U/L (ref 0–35)
AST: 13 U/L (ref 0–37)
CO2: 24 mEq/L (ref 19–32)
Calcium: 10.6 mg/dL — ABNORMAL HIGH (ref 8.4–10.5)
Chloride: 100 mEq/L (ref 96–112)
GFR calc non Af Amer: 19 mL/min — ABNORMAL LOW (ref >90)
Potassium: 5 mEq/L (ref 3.5–5.1)
Sodium: 135 mEq/L (ref 135–145)

## 2012-02-20 LAB — CBC
Platelets: 128 10*3/uL — ABNORMAL LOW (ref 150–400)
RDW: 14.1 % (ref 11.5–15.5)
WBC: 4.5 10*3/uL (ref 4.0–10.5)

## 2012-02-20 MED ORDER — HEPARIN SOD (PORK) LOCK FLUSH 100 UNIT/ML IV SOLN
500.0000 [IU] | Freq: Once | INTRAVENOUS | Status: AC | PRN
  Administered 2012-02-20: 500 [IU]
  Filled 2012-02-20: qty 5

## 2012-02-20 MED ORDER — SODIUM CHLORIDE 0.9 % IJ SOLN
10.0000 mL | INTRAMUSCULAR | Status: DC | PRN
  Administered 2012-02-20: 10 mL
  Filled 2012-02-20: qty 10

## 2012-02-20 MED ORDER — EPOETIN ALFA 40000 UNIT/ML IJ SOLN
40000.0000 [IU] | Freq: Once | INTRAMUSCULAR | Status: AC
  Administered 2012-02-20: 40000 [IU] via SUBCUTANEOUS

## 2012-02-20 MED ORDER — HYDROCODONE-ACETAMINOPHEN 10-325 MG PO TABS: ORAL_TABLET | ORAL | Status: DC

## 2012-02-20 MED ORDER — ZOLEDRONIC ACID 4 MG/5ML IV CONC
2.0000 mg | Freq: Once | INTRAVENOUS | Status: AC
  Administered 2012-02-20: 2 mg via INTRAVENOUS
  Filled 2012-02-20: qty 2.5

## 2012-02-20 MED ORDER — HEPARIN SOD (PORK) LOCK FLUSH 100 UNIT/ML IV SOLN
INTRAVENOUS | Status: AC
  Filled 2012-02-20: qty 5

## 2012-02-20 MED ORDER — EPOETIN ALFA 40000 UNIT/ML IJ SOLN
INTRAMUSCULAR | Status: AC
  Filled 2012-02-20: qty 1

## 2012-02-20 NOTE — Assessment & Plan Note (Signed)
Normal LVEF by recent echocardiogram. No definite wall motion abnormalities by this most recent study in setting of somewhat suboptimal images.

## 2012-02-20 NOTE — Progress Notes (Signed)
Hydrocodone-acetaminophen 10-325 called to [facility].

## 2012-02-20 NOTE — Progress Notes (Signed)
Clinical Summary Ms. Goshorn is a 78 y.o.female presenting for followup. She was seen in April.  She had a pre-chemotherapy echocardiogram done in April demonstrating LVEF of 55-60% no definite wall motion abnormalities based on somewhat suboptimal imaging. Dr. Mariel Sleet plans to treat her with 3 doses of Doxil, and she has already received the first  As noted previously, potential adverse reactions include cardiotoxicity and CHF. She has had problems with acute on chronic leg edema, but no progressive dyspnea or chest pain. She has responded to diuretics.   Allergies  Allergen Reactions  . Lenalidomide Rash    Current Outpatient Prescriptions  Medication Sig Dispense Refill  . ALPRAZolam (XANAX) 0.5 MG tablet Take 1 tablet (0.5 mg total) by mouth at bedtime as needed for sleep. Tale 1 during day prn for muscle relaxation and 2 hs prn sleep  90 tablet  1  . amLODipine (NORVASC) 5 MG tablet Take 1 tablet (5 mg total) by mouth daily.  30 tablet  2  . Cholecalciferol (VITAMIN D3) 1000 UNITS CAPS Take 1 capsule by mouth daily.      . clotrimazole-betamethasone (LOTRISONE) cream Apply topically 2 (two) times daily.  30 g  0  . diazepam (VALIUM) 5 MG tablet Take 5 mg by mouth every 6 (six) hours as needed. For sleep/anxiety, Is taking 2 at bedtime      . docusate sodium (COLACE) 100 MG capsule Take 100 mg by mouth 2 (two) times daily.        . folic acid (FOLVITE) 1 MG tablet Take 1 mg by mouth daily.        . furosemide (LASIX) 80 MG tablet Take 80 mg by mouth as directed. 1/2 po 5 days a week      . HYDROcodone-acetaminophen (NORCO) 10-325 MG per tablet Take 1-2 tablets, PO, every 6 hours  120 tablet  2  . iron polysaccharides (NU-IRON) 150 MG capsule Take 150 mg by mouth daily.        Marland Kitchen loperamide (IMODIUM A-D) 2 MG tablet Take 2 mg by mouth 4 (four) times daily as needed.      Marland Kitchen omeprazole (PRILOSEC) 20 MG capsule Take 1 capsule (20 mg total) by mouth daily.  30 capsule  4  . ondansetron  (ZOFRAN) 8 MG tablet Take 8 mg by mouth every 8 (eight) hours as needed.      . potassium chloride SA (K-DUR,KLOR-CON) 20 MEQ tablet Take 20 mEq by mouth daily.       . predniSONE (DELTASONE) 5 MG tablet Take 5 mg by mouth daily.        Marland Kitchen spironolactone (ALDACTONE) 50 MG tablet Take 1 tablet (50 mg total) by mouth 3 (three) times daily.  90 tablet  2   No current facility-administered medications for this visit.   Facility-Administered Medications Ordered in Other Visits  Medication Dose Route Frequency Provider Last Rate Last Dose  . epoetin alfa (EPOGEN,PROCRIT) injection 40,000 Units  40,000 Units Subcutaneous Once Ellouise Newer, PA   40,000 Units at 02/20/12 1205  . heparin lock flush 100 unit/mL  500 Units Intracatheter Once PRN Ellouise Newer, PA   500 Units at 02/20/12 1220  . sodium chloride 0.9 % injection 10 mL  10 mL Intracatheter PRN Ellouise Newer, PA   10 mL at 02/20/12 1135  . zolendronic acid (ZOMETA) 2 mg in sodium chloride 0.9 % 100 mL IVPB  2 mg Intravenous Once Ellouise Newer, PA 410 mL/hr at 02/20/12  1201 2 mg at 02/20/12 1201    Past Medical History  Diagnosis Date  . Essential hypertension, benign     LVH; RVH; normal EF in 04/2011 with the basilar inferior wall akinesis  . Edema   . Obesity   . Osteoporosis     Lumbar and thoracic compression fractures  . Multiple myeloma 2007    IgA lambda  . Gastroesophageal reflux disease   . Pernicious anemia     Plus thrombocytopenia;H./H.-9.8/29 in 12/2010  . Chronic kidney disease, stage 2, mildly decreased GFR     Creatinine of 1.3 in 09/2010; acute renal failure in 11/2010 secondary to diuretics with hyperkalemia secondary to Spiriva lactone  . Peripheral neuropathy     Complicating chemotherapy  . Chronic low back pain   . Chronic steroid use   . History of candidiasis   . Allergy     Revlimid  . Anemia of chronic disease   . B12 deficiency     Social History Ms. Coffie reports that she has never  smoked. She has never used smokeless tobacco. Ms. Macaluso reports that she does not drink alcohol.  Review of Systems No palpitations or bleeding problems.  Physical Examination Filed Vitals:   02/20/12 1257  BP: 131/76  Pulse: 76  Resp: 18    Obese woman in no acute distress.  HEENT: Conjunctiva and lids normal, oropharynx clear with moist mucosa.  Neck: Supple, no elevated JVP or carotid bruits, no thyromegaly.  Lungs: Clear to auscultation, nonlabored breathing at rest.  Cardiac: Regular rate and rhythm with ectopic beats, no S3, soft systolic murmur, no pericardial rub.  Abdomen: Soft, nontender, protuberant, bowel sounds present, no guarding or rebound.  Extremities: Chronic appearing edema is 2+, distal pulses 1-2+.      Problem List and Plan

## 2012-02-20 NOTE — Progress Notes (Signed)
Tolerated zometa and procrit well.

## 2012-02-20 NOTE — Assessment & Plan Note (Signed)
Now undergoing treatment with Doxil per Dr. Mariel Sleet. Baseline LVEF is normal by recent echocardiogram. Will plan a followup study in June around time of her third treatment, sooner if there are concerns about change in clinical status.

## 2012-02-20 NOTE — Progress Notes (Signed)
Labs today

## 2012-02-20 NOTE — Patient Instructions (Signed)
**Note De-Identified [name] Obfuscation** Your physician has requested that you have an echocardiogram. Echocardiography is a painless test that uses sound waves to create images of your heart. It provides your doctor with information about the size and shape of your heart and how well your heart's chambers and valves are working. This procedure takes approximately one hour. There are no restrictions for this procedure.  Your physician recommends that you schedule a follow-up appointment in: after Echo

## 2012-02-20 NOTE — Progress Notes (Signed)
Patient requests that her fluid pills be decreased a little.  She reports that she is taking 1/2 a tablet of Lasix daily and 50 mg 4 times daily.  We will keep spironolactone the same for now and go to Lasix 1/2 every other day.  If she continues to do well with this regimen, will consider altering spironolactone medication.  We will see how this does for her.   She also requests a refill on her hydrocodone.  [name]

## 2012-02-21 ENCOUNTER — Ambulatory Visit (HOSPITAL_COMMUNITY): Payer: Medicare Other

## 2012-02-21 ENCOUNTER — Other Ambulatory Visit (HOSPITAL_COMMUNITY): Payer: Medicare Other

## 2012-02-21 LAB — KAPPA/LAMBDA LIGHT CHAINS
Kappa free light chain: 3.89 mg/dL — ABNORMAL HIGH (ref 0.33–1.94)
Kappa, lambda light chain ratio: 0.21 — ABNORMAL LOW (ref 0.26–1.65)

## 2012-02-22 LAB — MULTIPLE MYELOMA PANEL, SERUM
Alpha-1-Globulin: 5.6 % — ABNORMAL HIGH (ref 2.9–4.9)
Beta 2: 4.4 % (ref 3.2–6.5)
Gamma Globulin: 9.1 % — ABNORMAL LOW (ref 11.1–18.8)

## 2012-02-25 ENCOUNTER — Telehealth (HOSPITAL_COMMUNITY): Payer: Self-pay | Admitting: Oncology

## 2012-02-25 ENCOUNTER — Other Ambulatory Visit (HOSPITAL_COMMUNITY): Payer: Self-pay | Admitting: Oncology

## 2012-02-25 DIAGNOSIS — R05 Cough: Secondary | ICD-10-CM

## 2012-02-25 DIAGNOSIS — R058 Other specified cough: Secondary | ICD-10-CM

## 2012-02-25 MED ORDER — AMOXICILLIN-POT CLAVULANATE 875-125 MG PO TABS: 1.0000 | ORAL_TABLET | Freq: Two times a day (BID) | ORAL | Status: DC

## 2012-02-25 NOTE — Telephone Encounter (Signed)
Rcvd a call from pt needing "something" called in for a cough,wheezing and yellow phlegm. Spk with Elijah Birk K and he stated he would call in an Antibiotic.

## 2012-02-29 ENCOUNTER — Encounter (HOSPITAL_COMMUNITY): Payer: Self-pay | Admitting: *Deleted

## 2012-02-29 ENCOUNTER — Emergency Department (HOSPITAL_COMMUNITY): Payer: Medicare Other

## 2012-02-29 ENCOUNTER — Inpatient Hospital Stay (HOSPITAL_COMMUNITY)
Admission: EM | Admit: 2012-02-29 | Discharge: 2012-03-05 | DRG: 191 | Disposition: A | Discharge status: Home Health | Payer: Medicare Other | Attending: Internal Medicine | Admitting: Internal Medicine

## 2012-02-29 DIAGNOSIS — I10 Essential (primary) hypertension: Secondary | ICD-10-CM | POA: Diagnosis present

## 2012-02-29 DIAGNOSIS — J209 Acute bronchitis, unspecified: Principal | ICD-10-CM | POA: Diagnosis present

## 2012-02-29 DIAGNOSIS — R609 Edema, unspecified: Secondary | ICD-10-CM

## 2012-02-29 DIAGNOSIS — C9000 Multiple myeloma not having achieved remission: Secondary | ICD-10-CM | POA: Diagnosis present

## 2012-02-29 DIAGNOSIS — J40 Bronchitis, not specified as acute or chronic: Secondary | ICD-10-CM

## 2012-02-29 DIAGNOSIS — R0602 Shortness of breath: Secondary | ICD-10-CM | POA: Diagnosis present

## 2012-02-29 DIAGNOSIS — M810 Age-related osteoporosis without current pathological fracture: Secondary | ICD-10-CM | POA: Diagnosis present

## 2012-02-29 DIAGNOSIS — R112 Nausea with vomiting, unspecified: Secondary | ICD-10-CM

## 2012-02-29 DIAGNOSIS — I129 Hypertensive chronic kidney disease with stage 1 through stage 4 chronic kidney disease, or unspecified chronic kidney disease: Secondary | ICD-10-CM | POA: Diagnosis present

## 2012-02-29 DIAGNOSIS — J440 Chronic obstructive pulmonary disease with acute lower respiratory infection: Principal | ICD-10-CM | POA: Diagnosis present

## 2012-02-29 DIAGNOSIS — M545 Low back pain, unspecified: Secondary | ICD-10-CM | POA: Diagnosis present

## 2012-02-29 DIAGNOSIS — D638 Anemia in other chronic diseases classified elsewhere: Secondary | ICD-10-CM | POA: Diagnosis present

## 2012-02-29 DIAGNOSIS — G8929 Other chronic pain: Secondary | ICD-10-CM | POA: Diagnosis present

## 2012-02-29 DIAGNOSIS — IMO0002 Reserved for concepts with insufficient information to code with codable children: Secondary | ICD-10-CM

## 2012-02-29 DIAGNOSIS — N182 Chronic kidney disease, stage 2 (mild): Secondary | ICD-10-CM | POA: Diagnosis present

## 2012-02-29 DIAGNOSIS — N183 Chronic kidney disease, stage 3 unspecified: Secondary | ICD-10-CM | POA: Diagnosis present

## 2012-02-29 LAB — BASIC METABOLIC PANEL
CO2: 20 mEq/L (ref 19–32)
Calcium: 9.9 mg/dL (ref 8.4–10.5)
Creatinine, Ser: 1.83 mg/dL — ABNORMAL HIGH (ref 0.50–1.10)
GFR calc non Af Amer: 26 mL/min — ABNORMAL LOW (ref >90)

## 2012-02-29 LAB — CBC
HCT: 30.3 % — ABNORMAL LOW (ref 36.0–46.0)
MCH: 32.5 pg (ref 26.0–34.0)
MCHC: 32.3 g/dL (ref 30.0–36.0)
MCV: 100.3 fL — ABNORMAL HIGH (ref 78.0–100.0)
Platelets: 191 10*3/uL (ref 150–400)
RDW: 15.1 % (ref 11.5–15.5)

## 2012-02-29 LAB — DIFFERENTIAL
Basophils Absolute: 0 10*3/uL (ref 0.0–0.1)
Eosinophils Absolute: 0 10*3/uL (ref 0.0–0.7)
Lymphs Abs: 0.4 10*3/uL — ABNORMAL LOW (ref 0.7–4.0)
Monocytes Absolute: 0.4 10*3/uL (ref 0.1–1.0)
Monocytes Relative: 12 % (ref 3–12)
Neutro Abs: 2.6 10*3/uL (ref 1.7–7.7)

## 2012-02-29 LAB — CARDIAC PANEL(CRET KIN+CKTOT+MB+TROPI): Troponin I: <0.3 ng/mL (ref <0.30)

## 2012-02-29 LAB — URINE MICROSCOPIC-ADD ON

## 2012-02-29 LAB — URINALYSIS, ROUTINE W REFLEX MICROSCOPIC
Glucose, UA: NEGATIVE mg/dL
Ketones, ur: NEGATIVE mg/dL
Leukocytes, UA: NEGATIVE
Urobilinogen, UA: 0.2 mg/dL (ref 0.0–1.0)

## 2012-02-29 MED ORDER — SODIUM CHLORIDE 0.9 % IV SOLN
1000.0000 mL | INTRAVENOUS | Status: AC
  Administered 2012-02-29: 1000 mL via INTRAVENOUS

## 2012-02-29 MED ORDER — ALBUTEROL SULFATE (5 MG/ML) 0.5% IN NEBU: 2.5000 mg | INHALATION_SOLUTION | RESPIRATORY_TRACT | Status: DC | PRN

## 2012-02-29 MED ORDER — FOLIC ACID 1 MG PO TABS
1.0000 mg | ORAL_TABLET | Freq: Every day | ORAL | Status: DC
  Administered 2012-03-01 – 2012-03-05 (×5): 1 mg via ORAL
  Filled 2012-02-29 (×5): qty 1

## 2012-02-29 MED ORDER — DIAZEPAM 5 MG PO TABS
5.0000 mg | ORAL_TABLET | Freq: Four times a day (QID) | ORAL | Status: DC | PRN
  Administered 2012-02-29: 5 mg via ORAL
  Filled 2012-02-29: qty 1

## 2012-02-29 MED ORDER — IPRATROPIUM BROMIDE 0.02 % IN SOLN
RESPIRATORY_TRACT | Status: AC
  Administered 2012-02-29: 0.5 mg
  Filled 2012-02-29: qty 2.5

## 2012-02-29 MED ORDER — ONDANSETRON HCL 4 MG PO TABS: 4.0000 mg | ORAL_TABLET | Freq: Four times a day (QID) | ORAL | Status: DC | PRN

## 2012-02-29 MED ORDER — ALBUTEROL SULFATE (5 MG/ML) 0.5% IN NEBU
INHALATION_SOLUTION | RESPIRATORY_TRACT | Status: AC
  Administered 2012-02-29: 2.5 mg
  Filled 2012-02-29: qty 0.5

## 2012-02-29 MED ORDER — ONDANSETRON HCL 4 MG/2ML IJ SOLN: 4.0000 mg | Freq: Three times a day (TID) | INTRAMUSCULAR | Status: DC | PRN

## 2012-02-29 MED ORDER — IPRATROPIUM BROMIDE 0.02 % IN SOLN
0.5000 mg | RESPIRATORY_TRACT | Status: DC
  Administered 2012-03-01 – 2012-03-05 (×20): 0.5 mg via RESPIRATORY_TRACT
  Filled 2012-02-29 (×19): qty 2.5

## 2012-02-29 MED ORDER — ALBUTEROL SULFATE (5 MG/ML) 0.5% IN NEBU
2.5000 mg | INHALATION_SOLUTION | RESPIRATORY_TRACT | Status: DC
  Administered 2012-03-01 – 2012-03-05 (×20): 2.5 mg via RESPIRATORY_TRACT
  Filled 2012-02-29 (×19): qty 0.5

## 2012-02-29 MED ORDER — LEVOFLOXACIN 500 MG PO TABS: 500.0000 mg | ORAL_TABLET | Freq: Every day | ORAL | Status: DC

## 2012-02-29 MED ORDER — METHYLPREDNISOLONE SODIUM SUCC 125 MG IJ SOLR
80.0000 mg | Freq: Four times a day (QID) | INTRAMUSCULAR | Status: DC
  Administered 2012-02-29 – 2012-03-03 (×10): 80 mg via INTRAVENOUS
  Filled 2012-02-29 (×11): qty 2

## 2012-02-29 MED ORDER — PREDNISONE 10 MG PO TABS: ORAL_TABLET | ORAL | Status: DC

## 2012-02-29 MED ORDER — AEROCHAMBER Z-STAT PLUS/MEDIUM MISC: 1.0000 | Freq: Once | Status: DC

## 2012-02-29 MED ORDER — LEVOFLOXACIN 750 MG PO TABS: 750.0000 mg | ORAL_TABLET | Freq: Every day | ORAL | Status: DC

## 2012-02-29 MED ORDER — LEVOFLOXACIN IN D5W 500 MG/100ML IV SOLN
500.0000 mg | INTRAVENOUS | Status: DC
  Filled 2012-02-29 (×2): qty 100

## 2012-02-29 MED ORDER — ACETAMINOPHEN 650 MG RE SUPP: 650.0000 mg | Freq: Four times a day (QID) | RECTAL | Status: DC | PRN

## 2012-02-29 MED ORDER — PANTOPRAZOLE SODIUM 40 MG PO TBEC
40.0000 mg | DELAYED_RELEASE_TABLET | Freq: Every day | ORAL | Status: DC
  Administered 2012-03-01 – 2012-03-05 (×5): 40 mg via ORAL
  Filled 2012-02-29 (×5): qty 1

## 2012-02-29 MED ORDER — METHYLPREDNISOLONE SODIUM SUCC 125 MG IJ SOLR
125.0000 mg | Freq: Once | INTRAMUSCULAR | Status: AC
  Administered 2012-02-29: 125 mg via INTRAVENOUS
  Filled 2012-02-29: qty 2

## 2012-02-29 MED ORDER — HEPARIN SODIUM (PORCINE) 5000 UNIT/ML IJ SOLN
5000.0000 [IU] | Freq: Three times a day (TID) | INTRAMUSCULAR | Status: DC
  Administered 2012-02-29 – 2012-03-05 (×14): 5000 [IU] via SUBCUTANEOUS
  Filled 2012-02-29: qty 1
  Filled 2012-02-29: qty 2
  Filled 2012-02-29 (×11): qty 1

## 2012-02-29 MED ORDER — ALPRAZOLAM 0.5 MG PO TABS
0.5000 mg | ORAL_TABLET | Freq: Every evening | ORAL | Status: DC | PRN
  Administered 2012-03-01 – 2012-03-04 (×4): 0.5 mg via ORAL
  Filled 2012-02-29 (×4): qty 1

## 2012-02-29 MED ORDER — ALBUTEROL SULFATE (5 MG/ML) 0.5% IN NEBU
2.5000 mg | INHALATION_SOLUTION | RESPIRATORY_TRACT | Status: DC
  Administered 2012-02-29: 2.5 mg via RESPIRATORY_TRACT

## 2012-02-29 MED ORDER — AMLODIPINE BESYLATE 5 MG PO TABS
5.0000 mg | ORAL_TABLET | Freq: Every day | ORAL | Status: DC
  Administered 2012-03-01 – 2012-03-05 (×5): 5 mg via ORAL
  Filled 2012-02-29 (×5): qty 1

## 2012-02-29 MED ORDER — IPRATROPIUM BROMIDE 0.02 % IN SOLN
0.5000 mg | Freq: Once | RESPIRATORY_TRACT | Status: AC
  Administered 2012-02-29: 0.5 mg via RESPIRATORY_TRACT
  Filled 2012-02-29: qty 2.5

## 2012-02-29 MED ORDER — ALBUTEROL SULFATE HFA 108 (90 BASE) MCG/ACT IN AERS: 2.0000 | INHALATION_SPRAY | RESPIRATORY_TRACT | Status: DC | PRN

## 2012-02-29 MED ORDER — MORPHINE SULFATE 2 MG/ML IJ SOLN
2.0000 mg | INTRAMUSCULAR | Status: DC | PRN
  Administered 2012-03-01 – 2012-03-03 (×5): 2 mg via INTRAVENOUS
  Filled 2012-02-29 (×5): qty 1

## 2012-02-29 MED ORDER — FUROSEMIDE 40 MG PO TABS
40.0000 mg | ORAL_TABLET | Freq: Every day | ORAL | Status: DC
  Administered 2012-03-02: 40 mg via ORAL
  Filled 2012-02-29 (×2): qty 1

## 2012-02-29 MED ORDER — HYDROCODONE-ACETAMINOPHEN 10-325 MG PO TABS
1.0000 | ORAL_TABLET | ORAL | Status: DC | PRN
Start: 2012-02-29 — End: 2012-03-05
  Administered 2012-03-01 – 2012-03-05 (×14): 1 via ORAL
  Filled 2012-02-29 (×14): qty 1

## 2012-02-29 MED ORDER — IPRATROPIUM BROMIDE 0.02 % IN SOLN
RESPIRATORY_TRACT | Status: AC
  Filled 2012-02-29: qty 2.5

## 2012-02-29 MED ORDER — IPRATROPIUM BROMIDE 0.02 % IN SOLN
0.5000 mg | RESPIRATORY_TRACT | Status: DC
  Administered 2012-02-29: 0.5 mg via RESPIRATORY_TRACT

## 2012-02-29 MED ORDER — ALBUTEROL SULFATE (5 MG/ML) 0.5% IN NEBU
INHALATION_SOLUTION | RESPIRATORY_TRACT | Status: AC
  Filled 2012-02-29: qty 0.5

## 2012-02-29 MED ORDER — LEVOFLOXACIN 500 MG PO TABS
500.0000 mg | ORAL_TABLET | Freq: Once | ORAL | Status: AC
  Administered 2012-02-29: 500 mg via ORAL
  Filled 2012-02-29: qty 1

## 2012-02-29 MED ORDER — ALBUTEROL SULFATE (5 MG/ML) 0.5% IN NEBU
5.0000 mg | INHALATION_SOLUTION | Freq: Once | RESPIRATORY_TRACT | Status: AC
  Administered 2012-02-29: 5 mg via RESPIRATORY_TRACT
  Filled 2012-02-29: qty 1

## 2012-02-29 MED ORDER — ONDANSETRON HCL 4 MG/2ML IJ SOLN: 4.0000 mg | Freq: Four times a day (QID) | INTRAMUSCULAR | Status: DC | PRN

## 2012-02-29 MED ORDER — SODIUM CHLORIDE 0.9 % IV SOLN
INTRAVENOUS | Status: AC
  Administered 2012-02-29: 21:00:00 via INTRAVENOUS

## 2012-02-29 MED ORDER — ACETAMINOPHEN 325 MG PO TABS: 650.0000 mg | ORAL_TABLET | Freq: Four times a day (QID) | ORAL | Status: DC | PRN

## 2012-02-29 NOTE — ED Notes (Signed)
Respiratory paged for breathing treatment 

## 2012-02-29 NOTE — H&P (Signed)
[name] is an 78 y.o. female.    PCP: [name], MD, MD  She's also followed by Dr. Laurie Panda, who is her oncologist  Chief Complaint: Cough and wheezing since Sunday  HPI: This is a 78 year old, Caucasian female, with a past medical history of multiple myeloma, hypertension, who was in her usual state of health till Sunday, when she started developing a cough. The cough has gotten slowly worse over the last few days. She's producing yellowish expectoration, without any blood. She also has wheezing, which is quite significant. Denies any fever, but having said that she hasn't checked her temperature. She called her oncologist on Monday and was prescribed Augmentin. She hasn't felt any improvement with this medication. Her shortness of breath is increased. However, she denies any chest pain. Denies any sick contacts. She was diagnosed with pneumonia last year. Denies any nausea, vomiting. Has been feeling very weak. However, she has been keeping herself hydrated. She denies smoking, however, she exposed to secondhand smoke.   Home Medications: Prior to Admission medications   Medication Sig Start Date End Date Taking? Authorizing Provider  ALPRAZolam Prudy Feeler) 0.5 MG tablet Take 1 tablet (0.5 mg total) by mouth at bedtime as needed for sleep. Tale 1 during day prn for muscle relaxation and 2 hs prn sleep 02/06/12 03/07/12 Yes Randall An, MD  amLODipine (NORVASC) 5 MG tablet Take 1 tablet (5 mg total) by mouth daily. 01/28/12 01/27/13 Yes Ellouise Newer, PA  amoxicillin-clavulanate (AUGMENTIN) 875-125 MG per tablet Take 1 tablet by mouth 2 (two) times daily. 02/25/12 03/06/12 Yes Ellouise Newer, PA  Cholecalciferol (VITAMIN D3) 1000 UNITS CAPS Take 1 capsule by mouth daily.   Yes Historical Provider, MD  diazepam (VALIUM) 5 MG tablet Take 5 mg by mouth every 6 (six) hours as needed. For sleep/anxiety, Is taking 2 at bedtime 12/21/11  Yes Ellouise Newer, PA  docusate sodium (COLACE)  100 MG capsule Take 100 mg by mouth every Monday, Wednesday, and Friday. Takes in the evening   Yes Historical Provider, MD  folic acid (FOLVITE) 1 MG tablet Take 1 mg by mouth daily.     Yes Historical Provider, MD  furosemide (LASIX) 40 MG tablet Take 40 mg by mouth daily.   Yes Historical Provider, MD  HYDROcodone-acetaminophen [facility]) 10-325 MG per tablet Take 1-2 tablets, PO, every 6 hours 02/20/12  Yes Ellouise Newer, PA  iron polysaccharides (NU-IRON) 150 MG capsule Take 150 mg by mouth every Monday, Wednesday, and Friday.    Yes Historical Provider, MD  omeprazole (PRILOSEC) 20 MG capsule Take 1 capsule (20 mg total) by mouth daily. 05/31/11  Yes Ellouise Newer, PA  potassium chloride SA (K-DUR,KLOR-CON) 20 MEQ tablet Take 20 mEq by mouth every Monday, Wednesday, and Friday.    Yes Historical Provider, MD  predniSONE (DELTASONE) 5 MG tablet Take 5 mg by mouth daily.     Yes Historical Provider, MD  spironolactone (ALDACTONE) 50 MG tablet Take 50 mg by mouth at bedtime. 01/29/12  Yes Randall An, MD  levofloxacin (LEVAQUIN) 500 MG tablet Take 1 tablet (500 mg total) by mouth daily. 02/29/12 03/10/12  Flint Melter, MD  loperamide (IMODIUM A-D) 2 MG tablet Take 2 mg by mouth 4 (four) times daily as needed.    Historical Provider, MD  ondansetron (ZOFRAN) 8 MG tablet Take 8 mg by mouth every 8 (eight) hours as needed.    Historical Provider, MD  predniSONE (DELTASONE) 10 MG  tablet Take q day 6,5,4,3,2,1- start 03/01/12 02/29/12   Flint Melter, MD    Allergies:  Allergies  Allergen Reactions  . Lenalidomide Rash    Past Medical History: Past Medical History  Diagnosis Date  . Essential hypertension, benign     LVH; RVH; normal EF in 04/2011 with the basilar inferior wall akinesis  . Edema   . Obesity   . Osteoporosis     Lumbar and thoracic compression fractures  . Multiple myeloma 2007    IgA lambda  . Gastroesophageal reflux disease   . Pernicious anemia     Plus  thrombocytopenia;H./H.-9.8/29 in 12/2010  . Chronic kidney disease, stage 2, mildly decreased GFR     Creatinine of 1.3 in 09/2010; acute renal failure in 11/2010 secondary to diuretics with hyperkalemia secondary to Spiriva lactone  . Peripheral neuropathy     Complicating chemotherapy  . Chronic low back pain   . Chronic steroid use   . History of candidiasis   . Allergy     Revlimid  . Anemia of chronic disease   . B12 deficiency     Past Surgical History  Procedure Date  . Total abdominal hysterectomy w/ bilateral salpingoophorectomy     Incidental appendectomy  . Rotator cuff repair     Left  . Vein surgery     Ligation and stripping bilaterally  . Orif ankle fracture     Right  . Carpal tunnel release     Right  . Central venous catheter tunneled insertion single lumen 2012    Social History:  reports that she has never smoked. She has never used smokeless tobacco. She reports that she does not drink alcohol or use illicit drugs.  Family History:  Family History  Problem Relation Age of Onset  . Heart attack Brother     Review of Systems - History obtained from the patient General ROS: positive for  - fatigue Psychological ROS: negative Ophthalmic ROS: negative ENT ROS: negative Allergy and Immunology ROS: negative Hematological and Lymphatic ROS: negative Endocrine ROS: negative Respiratory ROS: as in hpi Cardiovascular ROS: negative Gastrointestinal ROS: negative Genito-Urinary ROS: negative Musculoskeletal ROS: Chronic back pain Neurological ROS: negative Dermatological ROS: negative  Physical Examination Blood pressure 121/61, pulse 83, temperature 98.1 F (36.7 C), temperature source Oral, resp. rate 16, height 5\' 2"  (1.575 m), weight 92.987 kg (205 lb), SpO2 95.00%.  General appearance: alert, cooperative, appears stated age and no distress Head: Normocephalic, without obvious abnormality, atraumatic Eyes: conjunctivae/corneas clear. PERRL, EOM's  intact.  Throat: lips, mucosa, and tongue normal; teeth and gums normal Neck: no adenopathy, no carotid bruit, no JVD, supple, symmetrical, trachea midline and thyroid not enlarged, symmetric, no tenderness/mass/nodules Back: symmetric, no curvature. ROM normal. No CVA tenderness. Resp: Diffuse end expiratory wheezing, along with rhonchi. No definite crackles. Cardio: regular rate and rhythm, S1, S2 normal, no murmur, click, rub or gallop GI: soft, non-tender; bowel sounds normal; no masses,  no organomegaly Extremities: extremities normal, atraumatic, no cyanosis or edema Pulses: 2+ and symmetric Skin: Skin color, texture, turgor normal. No rashes or lesions Lymph nodes: Cervical, supraclavicular, and axillary nodes normal. Neurologic: Grossly normal  Laboratory Data: Results for orders placed during the hospital encounter of 02/29/12 (from the past 48 hour(s))  CBC     Status: Abnormal   Collection Time   02/29/12  2:24 PM      Component Value Range Comment   WBC 3.4 (*) 4.0 - 10.5 (K/uL)  RBC 3.02 (*) 3.87 - 5.11 (MIL/uL)    Hemoglobin 9.8 (*) 12.0 - 15.0 (g/dL)    HCT 16.1 (*) 09.6 - 46.0 (%)    MCV 100.3 (*) 78.0 - 100.0 (fL)    MCH 32.5  26.0 - 34.0 (pg)    MCHC 32.3  30.0 - 36.0 (g/dL)    RDW 04.5  40.9 - 81.1 (%)    Platelets 191  150 - 400 (K/uL)   DIFFERENTIAL     Status: Abnormal   Collection Time   02/29/12  2:24 PM      Component Value Range Comment   Neutrophils Relative 77  43 - 77 (%)    Lymphocytes Relative 11 (*) 12 - 46 (%)    Monocytes Relative 12  3 - 12 (%)    Eosinophils Relative 0  0 - 5 (%)    Basophils Relative 0  0 - 1 (%)    Neutro Abs 2.6  1.7 - 7.7 (K/uL)    Lymphs Abs 0.4 (*) 0.7 - 4.0 (K/uL)    Monocytes Absolute 0.4  0.1 - 1.0 (K/uL)    Eosinophils Absolute 0.0  0.0 - 0.7 (K/uL)    Basophils Absolute 0.0  0.0 - 0.1 (K/uL)   BASIC METABOLIC PANEL     Status: Abnormal   Collection Time   02/29/12  2:24 PM      Component Value Range Comment    Sodium 137  135 - 145 (mEq/L)    Potassium 4.5  3.5 - 5.1 (mEq/L)    Chloride 105  96 - 112 (mEq/L)    CO2 20  19 - 32 (mEq/L)    Glucose, Bld 181 (*) 70 - 99 (mg/dL)    BUN 46 (*) 6 - 23 (mg/dL)    Creatinine, Ser 9.14 (*) 0.50 - 1.10 (mg/dL)    Calcium 9.9  8.4 - 10.5 (mg/dL)    GFR calc non Af Amer 26 (*) >90 (mL/min)    GFR calc Af Amer 30 (*) >90 (mL/min)   CULTURE, BLOOD (SINGLE)     Status: Normal (Preliminary result)   Collection Time   02/29/12  2:25 PM      Component Value Range Comment   Specimen Description Blood PORTA CATH      Special Requests BOTTLES DRAWN AEROBIC AND ANAEROBIC 8 CC EACH      Culture PENDING      Report Status PENDING     LACTIC ACID, PLASMA     Status: Normal   Collection Time   02/29/12  2:25 PM      Component Value Range Comment   Lactic Acid, Venous 1.4  0.5 - 2.2 (mmol/L)   PROCALCITONIN     Status: Normal   Collection Time   02/29/12  2:25 PM      Component Value Range Comment   Procalcitonin <0.10     URINALYSIS, ROUTINE W REFLEX MICROSCOPIC     Status: Abnormal   Collection Time   02/29/12  2:51 PM      Component Value Range Comment   Color, Urine YELLOW  YELLOW     APPearance HAZY (*) CLEAR     Specific Gravity, Urine 1.020  1.005 - 1.030     pH 6.0  5.0 - 8.0     Glucose, UA NEGATIVE  NEGATIVE (mg/dL)    Hgb urine dipstick MODERATE (*) NEGATIVE     Bilirubin Urine NEGATIVE  NEGATIVE     Ketones, ur NEGATIVE  NEGATIVE (mg/dL)    Protein, ur TRACE (*) NEGATIVE (mg/dL)    Urobilinogen, UA 0.2  0.0 - 1.0 (mg/dL)    Nitrite NEGATIVE  NEGATIVE     Leukocytes, UA NEGATIVE  NEGATIVE    URINE MICROSCOPIC-ADD ON     Status: Abnormal   Collection Time   02/29/12  2:51 PM      Component Value Range Comment   Squamous Epithelial / LPF FEW (*) RARE     WBC, UA 0-2  <3 (WBC/hpf)    RBC / HPF 0-2  <3 (RBC/hpf)    Bacteria, UA MANY (*) RARE      Radiology Reports: Dg Chest 2 View  02/29/2012  *RADIOLOGY REPORT*  Clinical Data: Recurrent  multiple myeloma.  Short of breath. Wheezing.  CHEST - 2 VIEW  Comparison: 12/18/2011  Findings: Cardiomegaly shows no significant change.  Mild bibasilar scarring is stable.  No evidence of acute infiltrate or edema.  No evidence of pleural effusion.  No mass or lymphadenopathy identified.  Right-sided Port-A-Cath remains in appropriate position. Incidental note is made of several lower thoracic and lumbar vertebral body compression deformities.  IMPRESSION: Stable cardiomegaly and mild bibasilar scarring.  No active disease.  Original Report Authenticated By: Danae Orleans, M.D.    Electrocardiogram: Pending  Assessment/Plan  Principal Problem:  *Acute bronchitis Active Problems:  Essential hypertension, benign  Osteoporosis  Multiple myeloma  Chronic kidney disease, stage 2, mildly decreased GFR  Chronic low back pain  Chronic steroid use  Anemia of chronic disease   #1 acute bronchitis: She'll be treated with steroids, antibiotics, nebulizer treatments. We'll check an EKG. We'll check one set of cardiac enzymes. She recently had an echocardiogram, which showed normal EF. She is getting chemotherapy for her myeloma, and Doxil can cause cardiomyopathy. However, at this time there is no evidence for pulmonary edema or fluid overload or any evidence for cardiac process.  #2 chronic anemia, stable.  #3 history of multiple myeloma, stable.  #4 she is on chronic steroids for reasons that are not entirely clear.  #5 history of hypertension, stable. Continue with amlodipine.  #6 she has chronic kidney disease. Her renal function appears to be close to baseline.  She is a full code.  Further management decisions will depend on results of further testing and patient's response to treatment  [facility]  [facility] Pager [phone]  02/29/2012, 8:10 PM

## 2012-02-29 NOTE — ED Notes (Signed)
Implanted power port in right chest accessed with 20 gauge power port needle using sterile technique. Patient tolerated procedure well.

## 2012-02-29 NOTE — ED Notes (Signed)
Cough, congestion, Taking amoxicillin.  No fever.

## 2012-02-29 NOTE — ED Notes (Signed)
Advised patient when she could give a urine specimen to ring call bell.

## 2012-02-29 NOTE — ED Provider Notes (Signed)
History     CSN: [id number]  Arrival date & time 02/29/12  1314   First MD Initiated Contact with Patient 02/29/12 1357      Chief Complaint  Patient presents with  . Cough    (Consider location/radiation/quality/duration/timing/severity/associated sxs/prior treatment) HPI Comments: [name] is a 78 y.o. Female who is here complaining of cough. Started on amoxicillin 5 days ago and is no better. She also has shortness of breath. She denies fever, chills, nausea, vomiting, weakness, dizziness, or back pain. She does not use breathing medications at home. She is 3 weeks out from a chemotherapy for multiple myeloma. This is a new treatment for her. She has previously been treated with oral medications for multiple myeloma. She has been using her regular medicines, without relief. She did not try any specific medications for this problem.  Patient is a 78 y.o. female presenting with cough. The history is provided by the patient.  Cough    Past Medical History  Diagnosis Date  . Essential hypertension, benign     LVH; RVH; normal EF in 04/2011 with the basilar inferior wall akinesis  . Edema   . Obesity   . Osteoporosis     Lumbar and thoracic compression fractures  . Multiple myeloma 2007    IgA lambda  . Gastroesophageal reflux disease   . Pernicious anemia     Plus thrombocytopenia;H./H.-9.8/29 in 12/2010  . Chronic kidney disease, stage 2, mildly decreased GFR     Creatinine of 1.3 in 09/2010; acute renal failure in 11/2010 secondary to diuretics with hyperkalemia secondary to Spiriva lactone  . Peripheral neuropathy     Complicating chemotherapy  . Chronic low back pain   . Chronic steroid use   . History of candidiasis   . Allergy     Revlimid  . Anemia of chronic disease   . B12 deficiency     Past Surgical History  Procedure Date  . Total abdominal hysterectomy w/ bilateral salpingoophorectomy     Incidental appendectomy  . Rotator cuff repair     Left  .  Vein surgery     Ligation and stripping bilaterally  . Orif ankle fracture     Right  . Carpal tunnel release     Right  . Central venous catheter tunneled insertion single lumen 2012    Family History  Problem Relation Age of Onset  . Heart attack Brother     History  Substance Use Topics  . Smoking status: Never Smoker   . Smokeless tobacco: Never Used  . Alcohol Use: No    OB History    Grav Para Term Preterm Abortions TAB SAB Ect Mult Living                  Review of Systems  Respiratory: Positive for cough.   All other systems reviewed and are negative.    Allergies  Lenalidomide  Home Medications   Current Outpatient Rx  Name Route Sig Dispense Refill  . ALPRAZOLAM 0.5 MG PO TABS Oral Take 1 tablet (0.5 mg total) by mouth at bedtime as needed for sleep. Tale 1 during day prn for muscle relaxation and 2 hs prn sleep 90 tablet 1  . AMLODIPINE BESYLATE 5 MG PO TABS Oral Take 1 tablet (5 mg total) by mouth daily. 30 tablet 2  . AMOXICILLIN-POT CLAVULANATE 875-125 MG PO TABS Oral Take 1 tablet by mouth 2 (two) times daily. 20 tablet 0  . VITAMIN D3 1000  UNITS PO CAPS Oral Take 1 capsule by mouth daily.    Marland Kitchen DIAZEPAM 5 MG PO TABS Oral Take 5 mg by mouth every 6 (six) hours as needed. For sleep/anxiety, Is taking 2 at bedtime    . DOCUSATE SODIUM 100 MG PO CAPS Oral Take 100 mg by mouth every Monday, Wednesday, and Friday. Takes in the evening    . FOLIC ACID 1 MG PO TABS Oral Take 1 mg by mouth daily.      . FUROSEMIDE 40 MG PO TABS Oral Take 40 mg by mouth daily.    Marland Kitchen HYDROCODONE-ACETAMINOPHEN 10-325 MG PO TABS  Take 1-2 tablets, PO, every 6 hours 120 tablet 2  . POLYSACCHARIDE IRON COMPLEX 150 MG PO CAPS Oral Take 150 mg by mouth every Monday, Wednesday, and Friday.     Marland Kitchen OMEPRAZOLE 20 MG PO CPDR Oral Take 1 capsule (20 mg total) by mouth daily. 30 capsule 4  . POTASSIUM CHLORIDE CRYS ER 20 MEQ PO TBCR Oral Take 20 mEq by mouth every Monday, Wednesday, and  Friday.     Marland Kitchen PREDNISONE 5 MG PO TABS Oral Take 5 mg by mouth daily.      Marland Kitchen SPIRONOLACTONE 50 MG PO TABS Oral Take 50 mg by mouth at bedtime.    Marland Kitchen LEVOFLOXACIN 500 MG PO TABS Oral Take 1 tablet (500 mg total) by mouth daily. 7 tablet 0  . LOPERAMIDE HCL 2 MG PO TABS Oral Take 2 mg by mouth 4 (four) times daily as needed.    Marland Kitchen ONDANSETRON HCL 8 MG PO TABS Oral Take 8 mg by mouth every 8 (eight) hours as needed.    Marland Kitchen PREDNISONE 10 MG PO TABS  Take q day 6,5,4,3,2,1- start 03/01/12 21 tablet 0    Son will pick up 02/29/12    BP 124/74  Pulse 81  Temp(Src) 98.1 F (36.7 C) (Oral)  Resp 16  Ht 5\' 2"  (1.575 m)  Wt 205 lb (92.987 kg)  BMI 37.49 kg/m2  SpO2 92%  Physical Exam  Nursing note and vitals reviewed. Constitutional: She is oriented to person, place, and time. She appears well-developed and well-nourished.  HENT:  Head: Normocephalic and atraumatic.  Right Ear: External ear normal.  Left Ear: External ear normal.  Eyes: Conjunctivae and EOM are normal. Pupils are equal, round, and reactive to light.  Neck: Normal range of motion and phonation normal. Neck supple.  Cardiovascular: Normal rate, regular rhythm, normal heart sounds and intact distal pulses.   Pulmonary/Chest: She is in respiratory distress (moderate increased work of breathing). She exhibits no bony tenderness.       Decreased breath sounds bilaterally, with generalized wheezes and rhonchi.  Abdominal: Soft. Normal appearance. She exhibits no mass. There is no tenderness. There is no guarding.  Musculoskeletal: Normal range of motion. She exhibits no edema and no tenderness.  Neurological: She is alert and oriented to person, place, and time. She has normal strength. No cranial nerve deficit or sensory deficit. She exhibits normal muscle tone. Coordination normal.  Skin: Skin is warm, dry and intact.  Psychiatric: She has a normal mood and affect. Her behavior is normal. Judgment and thought content normal.    ED  Course  Procedures (including critical care time)  Emergency department treatment: Nebulizer x2. Oral Levaquin. IV, Solu-Medrol. To stabilized normal at 95-90% on room air  7:02 PM Reevaluation with update and discussion. After initial assessment and treatment, an updated evaluation reveals patient continues to exhibit increased work  of breathing and has generalized wheezing. She requires help to sit up.  Glynn Freas L   Labs Reviewed  CBC - Abnormal; Notable for the following:    WBC 3.4 (*)    RBC 3.02 (*)    Hemoglobin 9.8 (*)    HCT 30.3 (*)    MCV 100.3 (*)    All other components within normal limits  DIFFERENTIAL - Abnormal; Notable for the following:    Lymphocytes Relative 11 (*)    Lymphs Abs 0.4 (*)    All other components within normal limits  BASIC METABOLIC PANEL - Abnormal; Notable for the following:    Glucose, Bld 181 (*)    BUN 46 (*)    Creatinine, Ser 1.83 (*)    GFR calc non Af Amer 26 (*)    GFR calc Af Amer 30 (*)    All other components within normal limits  URINALYSIS, ROUTINE W REFLEX MICROSCOPIC - Abnormal; Notable for the following:    APPearance HAZY (*)    Hgb urine dipstick MODERATE (*)    Protein, ur TRACE (*)    All other components within normal limits  URINE MICROSCOPIC-ADD ON - Abnormal; Notable for the following:    Squamous Epithelial / LPF FEW (*)    Bacteria, UA MANY (*)    All other components within normal limits  CULTURE, BLOOD (SINGLE)  LACTIC ACID, PLASMA  PROCALCITONIN  URINE CULTURE   Dg Chest 2 View  02/29/2012  *RADIOLOGY REPORT*  Clinical Data: Recurrent multiple myeloma.  Short of breath. Wheezing.  CHEST - 2 VIEW  Comparison: 12/18/2011  Findings: Cardiomegaly shows no significant change.  Mild bibasilar scarring is stable.  No evidence of acute infiltrate or edema.  No evidence of pleural effusion.  No mass or lymphadenopathy identified.  Right-sided Port-A-Cath remains in appropriate position. Incidental note is made of  several lower thoracic and lumbar vertebral body compression deformities.  IMPRESSION: Stable cardiomegaly and mild bibasilar scarring.  No active disease.  Original Report Authenticated By: Danae Orleans, M.D.     1. Bronchitis       MDM  Cough and shortness of breath with evidence for bronchitis. Patient had a recent echocardiogram that showed a normal LV function. Doubt CHF. Doubt pneumonia. She is to be admitted for stabilization. She does not have an oxygen deficit    Plan: Admit to telemetry    Flint Melter, MD 02/29/12 1904

## 2012-03-01 DIAGNOSIS — C9000 Multiple myeloma not having achieved remission: Secondary | ICD-10-CM

## 2012-03-01 DIAGNOSIS — J209 Acute bronchitis, unspecified: Secondary | ICD-10-CM

## 2012-03-01 DIAGNOSIS — I10 Essential (primary) hypertension: Secondary | ICD-10-CM

## 2012-03-01 DIAGNOSIS — R112 Nausea with vomiting, unspecified: Secondary | ICD-10-CM

## 2012-03-01 LAB — COMPREHENSIVE METABOLIC PANEL
Albumin: 3.2 g/dL — ABNORMAL LOW (ref 3.5–5.2)
Alkaline Phosphatase: 51 U/L (ref 39–117)
BUN: 36 mg/dL — ABNORMAL HIGH (ref 6–23)
CO2: 20 mEq/L (ref 19–32)
Chloride: 107 mEq/L (ref 96–112)
Glucose, Bld: 193 mg/dL — ABNORMAL HIGH (ref 70–99)
Potassium: 4.5 mEq/L (ref 3.5–5.1)
Total Bilirubin: 0.2 mg/dL — ABNORMAL LOW (ref 0.3–1.2)

## 2012-03-01 LAB — CBC
MCV: 100 fL (ref 78.0–100.0)
Platelets: 196 10*3/uL (ref 150–400)
RDW: 15 % (ref 11.5–15.5)
WBC: 3.4 10*3/uL — ABNORMAL LOW (ref 4.0–10.5)

## 2012-03-01 MED ORDER — SODIUM CHLORIDE 0.9 % IJ SOLN
INTRAMUSCULAR | Status: AC
  Administered 2012-03-01: 10 mL
  Filled 2012-03-01: qty 3

## 2012-03-01 MED ORDER — LEVOFLOXACIN 250 MG PO TABS
250.0000 mg | ORAL_TABLET | Freq: Every day | ORAL | Status: DC
  Administered 2012-03-01 – 2012-03-04 (×4): 250 mg via ORAL
  Filled 2012-03-01 (×4): qty 1

## 2012-03-01 MED ORDER — BIOTENE DRY MOUTH MT LIQD
15.0000 mL | Freq: Two times a day (BID) | OROMUCOSAL | Status: DC
  Administered 2012-03-01 – 2012-03-05 (×9): 15 mL via OROMUCOSAL

## 2012-03-01 MED ORDER — SODIUM CHLORIDE 0.9 % IJ SOLN
INTRAMUSCULAR | Status: AC
  Administered 2012-03-01: 21:00:00
  Filled 2012-03-01: qty 3

## 2012-03-01 MED ORDER — GUAIFENESIN ER 600 MG PO TB12
1200.0000 mg | ORAL_TABLET | Freq: Two times a day (BID) | ORAL | Status: DC
  Administered 2012-03-01 – 2012-03-05 (×9): 1200 mg via ORAL
  Filled 2012-03-01 (×9): qty 2

## 2012-03-01 NOTE — Progress Notes (Signed)
.  PHARMACIST - PHYSICIAN COMMUNICATION DR:   [facility] CONCERNING: Antibiotic IV to Oral Route Change Policy  RECOMMENDATION: This patient is receiving Levaquin by the intravenous route.  Based on criteria approved by the Pharmacy and Therapeutics Committee, the antibiotic(s) is/are being converted to the equivalent oral dose form(s).   DESCRIPTION: These criteria include:  Patient being treated for a respiratory tract infection, urinary tract infection, or cellulitis  The patient is not neutropenic and does not exhibit a GI malabsorption state  The patient is eating (either orally or via tube) and/or has been taking other orally administered medications for a least 24 hours  The patient is improving clinically and has a Tmax < 100.5  If you have questions about this conversion, please contact the Pharmacy Department  [x]   [phone] )  [facility] []   [phone] )  [facility]  []   [phone] )  [facility] []   [phone] )  [facility]    This medication was also renally adjusted to 250mg  po daily after initial 500mg  dose for CrCl 30-53ml/min.  Kitti Mcclish, [facility], PHARMD 03/01/2012@11 :12 AM

## 2012-03-01 NOTE — Progress Notes (Signed)
03/01/12 1639 Patient ambulated in hallway with nurse supervision and walker. Tolerated well, general weakness. O2 sat 94% on r/a after ambulation. Assisted back up to chair, stated comfortable, chair alarm on for safety. Nursing to monitor.

## 2012-03-01 NOTE — Progress Notes (Signed)
Subjective: Feels that her breathing has improved, but not back to baseline yet, still coughing mucus   Objective: Vital signs in last 24 hours: Temp:  [98.1 F (36.7 C)-99 F (37.2 C)] 99 F (37.2 C) (05/11 0532) Pulse Rate:  [75-91] 78  (05/11 0532) Resp:  [16-24] 20  (05/11 0532) BP: (117-132)/(61-78) 117/72 mmHg (05/11 0532) SpO2:  [92 %-96 %] 93 % (05/11 0742) Weight:  [92.987 kg (205 lb)] 92.987 kg (205 lb) (05/10 1321) Weight change:  Last BM Date: 02/28/12  Intake/Output from previous day: 05/10 0701 - 05/11 0700 In: 602.5 [I.V.:602.5] Out: 100 [Urine:100]     Physical Exam: General: Alert, awake, oriented x3, in no acute distress. HEENT: No bruits, no goiter. Heart: Regular rate and rhythm, without murmurs, rubs, gallops. Lungs: mild exp wheeze b/l with diffuse rhonchi b/l. Abdomen: Soft, nontender, nondistended, positive bowel sounds. Extremities: No clubbing cyanosis or edema with positive pedal pulses. Neuro: Grossly intact, nonfocal.    Lab Results: Basic Metabolic Panel:  Basename 03/01/12 0448 02/29/12 1424  NA 136 137  K 4.5 4.5  CL 107 105  CO2 20 20  GLUCOSE 193* 181*  BUN 36* 46*  CREATININE 1.55* 1.83*  CALCIUM 9.1 9.9  MG -- --  PHOS -- --   Liver Function Tests:  Basename 03/01/12 0448  AST 13  ALT 16  ALKPHOS 51  BILITOT 0.2*  PROT 6.1  ALBUMIN 3.2*   No results found for this basename: LIPASE:2,AMYLASE:2 in the last 72 hours No results found for this basename: AMMONIA:2 in the last 72 hours CBC:  Basename 03/01/12 0448 02/29/12 1424  WBC 3.4* 3.4*  NEUTROABS -- 2.6  HGB 8.3* 9.8*  HCT 25.3* 30.3*  MCV 100.0 100.3*  PLT 196 191   Cardiac Enzymes:  Basename 02/29/12 2032  CKTOTAL 23  CKMB 1.4  CKMBINDEX --  TROPONINI <0.30   BNP: No results found for this basename: PROBNP:3 in the last 72 hours D-Dimer: No results found for this basename: DDIMER:2 in the last 72 hours CBG: No results found for this basename:  GLUCAP:6 in the last 72 hours Hemoglobin A1C: No results found for this basename: HGBA1C in the last 72 hours Fasting Lipid Panel: No results found for this basename: CHOL,HDL,LDLCALC,TRIG,CHOLHDL,LDLDIRECT in the last 72 hours Thyroid Function Tests: No results found for this basename: TSH,T4TOTAL,FREET4,T3FREE,THYROIDAB in the last 72 hours Anemia Panel: No results found for this basename: VITAMINB12,FOLATE,FERRITIN,TIBC,IRON,RETICCTPCT in the last 72 hours Coagulation: No results found for this basename: LABPROT:2,INR:2 in the last 72 hours Urine Drug Screen: Drugs of Abuse  No results found for this basename: labopia, cocainscrnur, labbenz, amphetmu, thcu, labbarb    Alcohol Level: No results found for this basename: ETH:2 in the last 72 hours Urinalysis:  Basename 02/29/12 1451  COLORURINE YELLOW  LABSPEC 1.020  PHURINE 6.0  GLUCOSEU NEGATIVE  HGBUR MODERATE*  BILIRUBINUR NEGATIVE  KETONESUR NEGATIVE  PROTEINUR TRACE*  UROBILINOGEN 0.2  NITRITE NEGATIVE  LEUKOCYTESUR NEGATIVE    Recent Results (from the past 240 hour(s))  CULTURE, BLOOD (SINGLE)     Status: Normal (Preliminary result)   Collection Time   02/29/12  2:25 PM      Component Value Range Status Comment   Specimen Description Blood PORTA CATH   Final    Special Requests BOTTLES DRAWN AEROBIC AND ANAEROBIC 8 CC EACH   Final    Culture PENDING   Incomplete    Report Status PENDING   Incomplete     Studies/Results: Dg  Chest 2 View  02/29/2012  *RADIOLOGY REPORT*  Clinical Data: Recurrent multiple myeloma.  Short of breath. Wheezing.  CHEST - 2 VIEW  Comparison: 12/18/2011  Findings: Cardiomegaly shows no significant change.  Mild bibasilar scarring is stable.  No evidence of acute infiltrate or edema.  No evidence of pleural effusion.  No mass or lymphadenopathy identified.  Right-sided Port-A-Cath remains in appropriate position. Incidental note is made of several lower thoracic and lumbar vertebral body  compression deformities.  IMPRESSION: Stable cardiomegaly and mild bibasilar scarring.  No active disease.  Original Report Authenticated By: Danae Orleans, M.D.    Medications: Scheduled Meds:   . sodium chloride  1,000 mL Intravenous STAT  . sodium chloride   Intravenous STAT  . albuterol  2.5 mg Nebulization Q4H WA  . albuterol  5 mg Nebulization Once  . albuterol      . albuterol      . amLODipine  5 mg Oral Daily  . antiseptic oral rinse  15 mL Mouth Rinse BID  . folic acid  1 mg Oral Daily  . furosemide  40 mg Oral Daily  . heparin  5,000 Units Subcutaneous Q8H  . ipratropium      . ipratropium      . ipratropium  0.5 mg Nebulization Once  . ipratropium  0.5 mg Nebulization Q4H WA  . levofloxacin (LEVAQUIN) IV  500 mg Intravenous Q24H  . levofloxacin  500 mg Oral Once  . methylPREDNISolone (SOLU-MEDROL) injection  125 mg Intravenous Once  . methylPREDNISolone (SOLU-MEDROL) injection  80 mg Intravenous Q6H  . pantoprazole  40 mg Oral Q1200  . sodium chloride      . sodium chloride      . DISCONTD: aerochamber Z-Stat Plus/medium  1 each Other Once  . DISCONTD: albuterol  2.5 mg Nebulization Q4H  . DISCONTD: ipratropium  0.5 mg Nebulization Q4H   Continuous Infusions:  PRN Meds:.acetaminophen, acetaminophen, albuterol, ALPRAZolam, diazepam, HYDROcodone-acetaminophen, morphine injection, ondansetron (ZOFRAN) IV, ondansetron, DISCONTD: albuterol, DISCONTD: albuterol, DISCONTD: ondansetron (ZOFRAN) IV  Assessment/Plan:  Principal Problem:  *Acute bronchitis Active Problems:  Essential hypertension, benign  Osteoporosis  Multiple myeloma  Chronic kidney disease, stage 2, mildly decreased GFR  Chronic low back pain  Chronic steroid use  Anemia of chronic disease   Plan:  Continue steroids, abx, nebs, add mucinex, encourage incentive spirometry, ambulate patient and record oxygen saturations.   Her anemia is likely due to her underlying hematologic condition, she  does not have evidence of bleeding. Will repeat CBC in am.  If patient is improved, then she can likely be discharged home tomorrow.   LOS: 1 day   Boe Deans [facility] Pager: [phone] 03/01/2012, 8:42 AM

## 2012-03-01 NOTE — Progress Notes (Signed)
Patient's hemoglobin was 8.3 this morning. Text paged the hospitalist  to make him aware.

## 2012-03-01 NOTE — Progress Notes (Signed)
03/01/12 1056 Patient assisted up to chair with nurse assist, tolerated fairly well, some general weakness. Chair alarm on for safety, call light within reach, states comfortable. Walker at bedside for ambulation, nursing to monitor.

## 2012-03-02 ENCOUNTER — Inpatient Hospital Stay (HOSPITAL_COMMUNITY): Payer: Medicare Other

## 2012-03-02 DIAGNOSIS — C9000 Multiple myeloma not having achieved remission: Secondary | ICD-10-CM

## 2012-03-02 DIAGNOSIS — J209 Acute bronchitis, unspecified: Secondary | ICD-10-CM

## 2012-03-02 DIAGNOSIS — R112 Nausea with vomiting, unspecified: Secondary | ICD-10-CM

## 2012-03-02 DIAGNOSIS — I10 Essential (primary) hypertension: Secondary | ICD-10-CM

## 2012-03-02 LAB — CBC
HCT: 26.9 % — ABNORMAL LOW (ref 36.0–46.0)
RDW: 15.2 % (ref 11.5–15.5)
WBC: 7 10*3/uL (ref 4.0–10.5)

## 2012-03-02 LAB — PRO B NATRIURETIC PEPTIDE: Pro B Natriuretic peptide (BNP): 897.1 pg/mL — ABNORMAL HIGH (ref 0–450)

## 2012-03-02 MED ORDER — SODIUM CHLORIDE 0.9 % IJ SOLN
INTRAMUSCULAR | Status: AC
  Administered 2012-03-02: 10 mL
  Filled 2012-03-02: qty 3

## 2012-03-02 MED ORDER — SODIUM CHLORIDE 0.9 % IJ SOLN
INTRAMUSCULAR | Status: AC
  Administered 2012-03-02: 3 mL
  Filled 2012-03-02: qty 3

## 2012-03-02 NOTE — Progress Notes (Signed)
03/02/12 1500 Patient ambulated in hallway with walker and nurse supervision this morning before lunch. Tolerated well, some wheezing and weakness with activity. O2 sats remained about 96% on r/a during ambulation. Discussed with Dr Kerry Hough this morning. Up to chair as well, stated comfortable. Chair alarm on for safety, encouraged to call for assist.

## 2012-03-02 NOTE — Progress Notes (Signed)
Subjective: Patient says her breathing is better but she is still wheezing and feels short of breath  Objective: Vital signs in last 24 hours: Temp:  [97.8 F (36.6 C)-98.2 F (36.8 C)] 97.8 F (36.6 C) (05/12 0607) Pulse Rate:  [77-87] 77  (05/12 0607) Resp:  [20] 20  (05/12 0607) BP: (121-132)/(71-72) 132/72 mmHg (05/12 0607) SpO2:  [92 %-96 %] 96 % (05/12 0607) Weight change:  Last BM Date: 02/28/12  Intake/Output from previous day: 05/11 0701 - 05/12 0700 In: 600 [P.O.:600] Out: 650 [Urine:650]     Physical Exam: General: Alert, awake, oriented x3, in no acute distress. HEENT: No bruits, no goiter. Heart: Regular rate and rhythm, without murmurs, rubs, gallops. Lungs: diffuse b/l wheezing with increased wob. Having trouble completing sentences  Abdomen: Soft, nontender, nondistended, positive bowel sounds. Extremities: No clubbing cyanosis or edema with positive pedal pulses. Neuro: Grossly intact, nonfocal.    Lab Results: Basic Metabolic Panel:  Basename 03/01/12 0448 02/29/12 1424  NA 136 137  K 4.5 4.5  CL 107 105  CO2 20 20  GLUCOSE 193* 181*  BUN 36* 46*  CREATININE 1.55* 1.83*  CALCIUM 9.1 9.9  MG -- --  PHOS -- --   Liver Function Tests:  Basename 03/01/12 0448  AST 13  ALT 16  ALKPHOS 51  BILITOT 0.2*  PROT 6.1  ALBUMIN 3.2*   No results found for this basename: LIPASE:2,AMYLASE:2 in the last 72 hours No results found for this basename: AMMONIA:2 in the last 72 hours CBC:  Basename 03/02/12 0655 03/01/12 0448 02/29/12 1424  WBC 7.0 3.4* --  NEUTROABS -- -- 2.6  HGB 8.7* 8.3* --  HCT 26.9* 25.3* --  MCV 100.7* 100.0 --  PLT 236 196 --   Cardiac Enzymes:  Basename 02/29/12 2032  CKTOTAL 23  CKMB 1.4  CKMBINDEX --  TROPONINI <0.30   BNP: No results found for this basename: PROBNP:3 in the last 72 hours D-Dimer: No results found for this basename: DDIMER:2 in the last 72 hours CBG: No results found for this basename:  GLUCAP:6 in the last 72 hours Hemoglobin A1C: No results found for this basename: HGBA1C in the last 72 hours Fasting Lipid Panel: No results found for this basename: CHOL,HDL,LDLCALC,TRIG,CHOLHDL,LDLDIRECT in the last 72 hours Thyroid Function Tests: No results found for this basename: TSH,T4TOTAL,FREET4,T3FREE,THYROIDAB in the last 72 hours Anemia Panel: No results found for this basename: VITAMINB12,FOLATE,FERRITIN,TIBC,IRON,RETICCTPCT in the last 72 hours Coagulation: No results found for this basename: LABPROT:2,INR:2 in the last 72 hours Urine Drug Screen: Drugs of Abuse  No results found for this basename: labopia, cocainscrnur, labbenz, amphetmu, thcu, labbarb    Alcohol Level: No results found for this basename: ETH:2 in the last 72 hours Urinalysis:  Basename 02/29/12 1451  COLORURINE YELLOW  LABSPEC 1.020  PHURINE 6.0  GLUCOSEU NEGATIVE  HGBUR MODERATE*  BILIRUBINUR NEGATIVE  KETONESUR NEGATIVE  PROTEINUR TRACE*  UROBILINOGEN 0.2  NITRITE NEGATIVE  LEUKOCYTESUR NEGATIVE    Recent Results (from the past 240 hour(s))  CULTURE, BLOOD (SINGLE)     Status: Normal (Preliminary result)   Collection Time   02/29/12  2:25 PM      Component Value Range Status Comment   Specimen Description Blood PORTA CATH   Final    Special Requests BOTTLES DRAWN AEROBIC AND ANAEROBIC 8 CC EACH   Final    Culture NO GROWTH 2 DAYS   Final    Report Status PENDING   Incomplete  Studies/Results: Dg Chest 2 View  02/29/2012  *RADIOLOGY REPORT*  Clinical Data: Recurrent multiple myeloma.  Short of breath. Wheezing.  CHEST - 2 VIEW  Comparison: 12/18/2011  Findings: Cardiomegaly shows no significant change.  Mild bibasilar scarring is stable.  No evidence of acute infiltrate or edema.  No evidence of pleural effusion.  No mass or lymphadenopathy identified.  Right-sided Port-A-Cath remains in appropriate position. Incidental note is made of several lower thoracic and lumbar vertebral body  compression deformities.  IMPRESSION: Stable cardiomegaly and mild bibasilar scarring.  No active disease.  Original Report Authenticated By: Danae Orleans, M.D.    Medications: Scheduled Meds:   . albuterol  2.5 mg Nebulization Q4H WA  . amLODipine  5 mg Oral Daily  . antiseptic oral rinse  15 mL Mouth Rinse BID  . folic acid  1 mg Oral Daily  . furosemide  40 mg Oral Daily  . guaiFENesin  1,200 mg Oral BID  . heparin  5,000 Units Subcutaneous Q8H  . ipratropium  0.5 mg Nebulization Q4H WA  . levofloxacin  250 mg Oral q1800  . methylPREDNISolone (SOLU-MEDROL) injection  80 mg Intravenous Q6H  . pantoprazole  40 mg Oral Q1200  . sodium chloride      . sodium chloride      . sodium chloride       Continuous Infusions:  PRN Meds:.acetaminophen, acetaminophen, albuterol, ALPRAZolam, diazepam, HYDROcodone-acetaminophen, morphine injection, ondansetron (ZOFRAN) IV, ondansetron  Assessment/Plan:  Principal Problem:  *Acute bronchitis Active Problems:  Essential hypertension, benign  Osteoporosis  Multiple myeloma  Chronic kidney disease, stage 2, mildly decreased GFR  Chronic low back pain  Chronic steroid use  Anemia of chronic disease  Plan:  Patient continues to have wheezing and shortness of breath. She appears to be on maximal treatment with regards to any airway constriction.  We will continue current therapy, repeat chest xray and check a bnp  PT to see in am  Plan will be to discharge home once resp status has improve.   LOS: 2 days   Latrice Storlie [facility] Pager: [phone] 03/02/2012, 11:25 AM

## 2012-03-02 NOTE — Progress Notes (Signed)
03/02/12 1505 Preliminary urine culture results positive for e-coli. Notified Dr Kerry Hough.

## 2012-03-03 ENCOUNTER — Other Ambulatory Visit (HOSPITAL_COMMUNITY): Payer: Self-pay | Admitting: *Deleted

## 2012-03-03 DIAGNOSIS — I10 Essential (primary) hypertension: Secondary | ICD-10-CM

## 2012-03-03 DIAGNOSIS — C9000 Multiple myeloma not having achieved remission: Secondary | ICD-10-CM

## 2012-03-03 DIAGNOSIS — R112 Nausea with vomiting, unspecified: Secondary | ICD-10-CM

## 2012-03-03 DIAGNOSIS — J209 Acute bronchitis, unspecified: Secondary | ICD-10-CM

## 2012-03-03 LAB — URINE CULTURE: Colony Count: 10000

## 2012-03-03 MED ORDER — SODIUM CHLORIDE 0.9 % IJ SOLN
INTRAMUSCULAR | Status: AC
  Administered 2012-03-03: 3 mL
  Filled 2012-03-03: qty 3

## 2012-03-03 MED ORDER — SODIUM CHLORIDE 0.9 % IJ SOLN
INTRAMUSCULAR | Status: AC
  Administered 2012-03-03: 10 mL
  Filled 2012-03-03: qty 3

## 2012-03-03 MED ORDER — FUROSEMIDE 10 MG/ML IJ SOLN
40.0000 mg | Freq: Two times a day (BID) | INTRAMUSCULAR | Status: DC
  Administered 2012-03-03 – 2012-03-04 (×3): 40 mg via INTRAVENOUS
  Filled 2012-03-03 (×3): qty 4

## 2012-03-03 MED ORDER — SODIUM CHLORIDE 0.9 % IJ SOLN
INTRAMUSCULAR | Status: AC
  Administered 2012-03-03: 16:00:00
  Filled 2012-03-03: qty 3

## 2012-03-03 MED ORDER — METHYLPREDNISOLONE SODIUM SUCC 125 MG IJ SOLR
125.0000 mg | Freq: Four times a day (QID) | INTRAMUSCULAR | Status: DC
  Administered 2012-03-03 – 2012-03-05 (×8): 125 mg via INTRAVENOUS
  Filled 2012-03-03 (×6): qty 2
  Filled 2012-03-03: qty 4

## 2012-03-03 NOTE — Progress Notes (Signed)
Pt ambulated in hallway with NT using standard walker.  Tolerated well but had some SOB at end.

## 2012-03-03 NOTE — Progress Notes (Signed)
Pt up and in chair today.  Assisted NT with basin bath.  Tolerated well.  Ambulated in hallway with PT this morning.  Tolerated well.

## 2012-03-03 NOTE — Progress Notes (Signed)
UR Chart Review Completed  

## 2012-03-03 NOTE — Evaluation (Signed)
Physical Therapy Evaluation Patient Details Name: [name] MRN: [id number] DOB: 1933-07-20 Today's Date: 03/03/2012 Time: 6440-3474 PT Time Calculation (min): 19 min  PT Assessment / Plan / Recommendation Clinical Impression  Pt seen for eval, determined to be at functional baseline.  No PT needed.    PT Assessment  Patent does not need any further PT services    Follow Up Recommendations  No PT follow up    Barriers to Discharge        lEquipment Recommendations  None recommended by PT    Recommendations for Other Services     Frequency      Precautions / Restrictions Precautions Precautions: None Restrictions Weight Bearing Restrictions: No   Pertinent Vitals/Pain       Mobility  Bed Mobility Bed Mobility: Supine to Sit Supine to Sit: 5: Supervision Transfers Transfers: Sit to Stand;Stand to Sit Sit to Stand: 6: Modified independent (Device/Increase time) Stand to Sit: 6: Modified independent (Device/Increase time) Ambulation/Gait Ambulation/Gait Assistance: 6: Modified independent (Device/Increase time) Ambulation Distance (Feet): 90 Feet Assistive device: Rolling walker Gait Pattern: Trunk flexed Gait velocity: slow, steady Stairs: No Wheelchair Mobility Wheelchair Mobility: No    Exercises     PT Diagnosis:    PT Problem List:   PT Treatment Interventions:     PT Goals    Visit Information  Last PT Received On: 03/03/12    Subjective Data  Subjective: I have osteoporosis and bone CA Patient Stated Goal: return home   Prior Functioning  Home Living Lives With: Family Available Help at Discharge: Family Type of Home: House Home Access: Ramped entrance Home Layout: One level Bathroom Shower/Tub: Engineer, manufacturing systems: Standard Home Adaptive Equipment: Walker - rolling;Wheelchair - manual;Hospital bed;Bedside commode/3-in-1;Shower chair with back Prior Function Level of Independence: Independent with assistive  device(s) Able to Take Stairs?: No Driving: No Vocation: Retired Musician: No difficulties    Cognition  Overall Cognitive Status: Appears within functional limits for tasks assessed/performed Arousal/Alertness: Awake/alert Orientation Level: Appears intact for tasks assessed Behavior During Session: [facility] for tasks performed    Extremity/Trunk Assessment Right Upper Extremity Assessment RUE ROM/Strength/Tone: Deficits RUE ROM/Strength/Tone Deficits: old fx of R humerus with limited function R shoulder RUE Sensation: WFL - Light Touch;WFL - Proprioception Left Upper Extremity Assessment LUE ROM/Strength/Tone: WFL for tasks assessed LUE Sensation: WFL - Light Touch;WFL - Proprioception LUE Coordination: WFL - gross motor Right Lower Extremity Assessment RLE ROM/Strength/Tone: WFL for tasks assessed RLE Sensation: WFL - Light Touch;WFL - Proprioception RLE Coordination: WFL - gross motor Left Lower Extremity Assessment LLE ROM/Strength/Tone: WFL for tasks assessed LLE Sensation: WFL - Light Touch;WFL - Proprioception LLE Coordination: WFL - gross motor Trunk Assessment Trunk Assessment: Kyphotic   Balance Balance Balance Assessed: No  End of Session PT - End of Session Equipment Utilized During Treatment: Gait belt Activity Tolerance: Patient tolerated treatment well Patient left: in chair;with call bell/phone within reach;with chair alarm set Nurse Communication: Mobility status   Konrad Penta 03/03/2012, 8:48 AM

## 2012-03-03 NOTE — Progress Notes (Signed)
Subjective: Still feels short of breath, coughing, wheezing, breathless while talking  Objective: Vital signs in last 24 hours: Temp:  [97.3 F (36.3 C)-97.9 F (36.6 C)] 97.7 F (36.5 C) (05/13 0428) Pulse Rate:  [72-87] 75  (05/13 0428) Resp:  [18-20] 20  (05/13 0428) BP: (119-122)/(67-77) 122/71 mmHg (05/13 0428) SpO2:  [94 %-96 %] 96 % (05/13 0844) Weight change:  Last BM Date: 03/01/12  Intake/Output from previous day: 05/12 0701 - 05/13 0700 In: 840 [P.O.:840] Out: 1325 [Urine:1325] Total I/O In: 550 [P.O.:550] Out: -    Physical Exam: General: Alert, awake, oriented x3, in no acute distress. HEENT: No bruits, no goiter. Heart: Regular rate and rhythm, without murmurs, rubs, gallops. Lungs: diffuse exp wheezes, scattered rhonchi. Abdomen: Soft, nontender, nondistended, positive bowel sounds. Extremities: 1+ edema Neuro: Grossly intact, nonfocal.    Lab Results: Basic Metabolic Panel:  Basename 03/01/12 0448 02/29/12 1424  NA 136 137  K 4.5 4.5  CL 107 105  CO2 20 20  GLUCOSE 193* 181*  BUN 36* 46*  CREATININE 1.55* 1.83*  CALCIUM 9.1 9.9  MG -- --  PHOS -- --   Liver Function Tests:  Basename 03/01/12 0448  AST 13  ALT 16  ALKPHOS 51  BILITOT 0.2*  PROT 6.1  ALBUMIN 3.2*   No results found for this basename: LIPASE:2,AMYLASE:2 in the last 72 hours No results found for this basename: AMMONIA:2 in the last 72 hours CBC:  Basename 03/02/12 0655 03/01/12 0448 02/29/12 1424  WBC 7.0 3.4* --  NEUTROABS -- -- 2.6  HGB 8.7* 8.3* --  HCT 26.9* 25.3* --  MCV 100.7* 100.0 --  PLT 236 196 --   Cardiac Enzymes:  Basename 02/29/12 2032  CKTOTAL 23  CKMB 1.4  CKMBINDEX --  TROPONINI <0.30   BNP:  Basename 03/02/12 1125  PROBNP 897.1*   D-Dimer: No results found for this basename: DDIMER:2 in the last 72 hours CBG: No results found for this basename: GLUCAP:6 in the last 72 hours Hemoglobin A1C: No results found for this basename:  HGBA1C in the last 72 hours Fasting Lipid Panel: No results found for this basename: CHOL,HDL,LDLCALC,TRIG,CHOLHDL,LDLDIRECT in the last 72 hours Thyroid Function Tests: No results found for this basename: TSH,T4TOTAL,FREET4,T3FREE,THYROIDAB in the last 72 hours Anemia Panel: No results found for this basename: VITAMINB12,FOLATE,FERRITIN,TIBC,IRON,RETICCTPCT in the last 72 hours Coagulation: No results found for this basename: LABPROT:2,INR:2 in the last 72 hours Urine Drug Screen: Drugs of Abuse  No results found for this basename: labopia, cocainscrnur, labbenz, amphetmu, thcu, labbarb    Alcohol Level: No results found for this basename: ETH:2 in the last 72 hours Urinalysis:  Basename 02/29/12 1451  COLORURINE YELLOW  LABSPEC 1.020  PHURINE 6.0  GLUCOSEU NEGATIVE  HGBUR MODERATE*  BILIRUBINUR NEGATIVE  KETONESUR NEGATIVE  PROTEINUR TRACE*  UROBILINOGEN 0.2  NITRITE NEGATIVE  LEUKOCYTESUR NEGATIVE    Recent Results (from the past 240 hour(s))  CULTURE, BLOOD (SINGLE)     Status: Normal (Preliminary result)   Collection Time   02/29/12  2:25 PM      Component Value Range Status Comment   Specimen Description Blood PORTA CATH   Final    Special Requests BOTTLES DRAWN AEROBIC AND ANAEROBIC 8 CC EACH   Final    Culture NO GROWTH 2 DAYS   Final    Report Status PENDING   Incomplete   URINE CULTURE     Status: Normal (Preliminary result)   Collection Time   02/29/12  2:51 PM      Component Value Range Status Comment   Specimen Description URINE, CLEAN CATCH   Final    Special Requests Immunocompromised   Final    Culture  Setup Time [id number]   Final    Colony Count 10,000 COLONIES/ML   Final    Culture ESCHERICHIA COLI   Final    Report Status PENDING   Incomplete     Studies/Results: Dg Chest 2 View  03/02/2012  *RADIOLOGY REPORT*  Clinical Data: Shortness of breath and wheezing.  CHEST - 2 VIEW  Comparison: Two-view chest 02/29/2012.  Findings: A right  subclavian Port-A-Cath is stable.  The port is accessed.  Mild cardiac enlargement is noted.  Emphysematous changes are present.  Bibasilar atelectasis or scarring is similar to the prior exam.  IMPRESSION:  1.  Stable mild cardiomegaly without failure. 2.  Emphysema. 3.  Mild bibasilar atelectasis or scarring. 4.  The right subclavian Port-A-Cath is accessed.  Original Report Authenticated By: Jamesetta Orleans. MATTERN, M.D.    Medications: Scheduled Meds:   . albuterol  2.5 mg Nebulization Q4H WA  . amLODipine  5 mg Oral Daily  . antiseptic oral rinse  15 mL Mouth Rinse BID  . folic acid  1 mg Oral Daily  . furosemide  40 mg Oral Daily  . guaiFENesin  1,200 mg Oral BID  . heparin  5,000 Units Subcutaneous Q8H  . ipratropium  0.5 mg Nebulization Q4H WA  . levofloxacin  250 mg Oral q1800  . methylPREDNISolone (SOLU-MEDROL) injection  80 mg Intravenous Q6H  . pantoprazole  40 mg Oral Q1200  . sodium chloride      . sodium chloride      . sodium chloride      . sodium chloride       Continuous Infusions:  PRN Meds:.acetaminophen, acetaminophen, albuterol, ALPRAZolam, diazepam, HYDROcodone-acetaminophen, morphine injection, ondansetron (ZOFRAN) IV, ondansetron  Assessment/Plan:  Principal Problem:  *Acute bronchitis Active Problems:  Essential hypertension, benign  Osteoporosis  Multiple myeloma  Chronic kidney disease, stage 2, mildly decreased GFR  Chronic low back pain  Chronic steroid use  Anemia of chronic disease  Plan:  1. Acute bronchitis, with likely underlying COPD.  Will increase steroid dosing.  Continue nebs and abx.  BNP is mildly elevated.  Will change lasix to IV for 24-48hrs.  Chest xray does not show any evidence of pneumonia  She is on chronic steroids for her hematologic disease  Recheck labs in am   LOS: 3 days   Samar Venneman [facility] Pager: [phone] 03/03/2012, 9:58 AM

## 2012-03-03 NOTE — Care Management Note (Signed)
    Page 1 of 1   03/05/2012     1:00:35 PM   CARE MANAGEMENT NOTE 03/05/2012  Patient:  [name], [name]   Account Number:  [id number]  Date Initiated:  03/03/2012  Documentation initiated by:  Rosemary Holms  Subjective/Objective Assessment:   Pt admitted with COPD. Per pt she lives at home with her 76 yo granddaughter who takes "good care of her".     Action/Plan:   We talked about previous HH and SNF but pt believes she can return home with granddaughter without HH.   Anticipated DC Date:  03/05/2012   Anticipated DC Plan:  HOME W HOME HEALTH SERVICES      DC Planning Services  CM consult      Choice offered to / List presented to:     DME arranged  NEBULIZER MACHINE      DME agency  [facility]     HH arranged  HH-1 RN  HH-10 DISEASE MANAGEMENT      HH agency  [facility]   Status of service:  Completed, signed off Medicare Important Message given?   (If response is "NO", the following Medicare IM given date fields will be blank) Date Medicare IM given:   Date Additional Medicare IM given:    Discharge Disposition:  HOME W HOME HEALTH SERVICES  Per UR Regulation:    If discussed at Long Length of Stay Meetings, dates discussed:    Comments:  03/05/12 1200 Trinidy Masterson RN BSN CM Order/Referral faxed to [facility]. Nebulizer order sent to [facility] via [facility] and verified by telephone.   03/03/12 1300 [place] Leanord Hawking RN BSN CM

## 2012-03-03 NOTE — Progress Notes (Signed)
Pt is inpt and her son [name] came by with questions about her being seen while in Hospital instead of Wednesday. Told him that admitting Dr. Eulah Citizen need to request oncology consult. Pt does not have any return appts in system for procrit, labs, zometa, etc.

## 2012-03-03 NOTE — Progress Notes (Signed)
Inpatient Diabetes Program Recommendations  AACE/ADA: New Consensus Statement on Inpatient Glycemic Control  Target Ranges:  Prepandial:   less than 140 mg/dL      Peak postprandial:   less than 180 mg/dL (1-2 hours)      Critically ill patients:  140 - 180 mg/dL  Pager:  [phone] Hours:  8 am-10pm   Reason for Visit: Steroid-Induced Hyperglycemia: 193 mg/dL  Inpatient Diabetes Program Recommendations Correction (SSI): Consider adding Novolog Correction

## 2012-03-04 DIAGNOSIS — C9000 Multiple myeloma not having achieved remission: Secondary | ICD-10-CM

## 2012-03-04 DIAGNOSIS — I10 Essential (primary) hypertension: Secondary | ICD-10-CM

## 2012-03-04 DIAGNOSIS — R112 Nausea with vomiting, unspecified: Secondary | ICD-10-CM

## 2012-03-04 DIAGNOSIS — J209 Acute bronchitis, unspecified: Secondary | ICD-10-CM

## 2012-03-04 LAB — CBC
Hemoglobin: 8.5 g/dL — ABNORMAL LOW (ref 12.0–15.0)
MCH: 32.7 pg (ref 26.0–34.0)
RBC: 2.6 MIL/uL — ABNORMAL LOW (ref 3.87–5.11)
WBC: 7.3 10*3/uL (ref 4.0–10.5)

## 2012-03-04 LAB — BASIC METABOLIC PANEL
GFR calc Af Amer: 29 mL/min — ABNORMAL LOW (ref >90)
GFR calc non Af Amer: 25 mL/min — ABNORMAL LOW (ref >90)
Glucose, Bld: 196 mg/dL — ABNORMAL HIGH (ref 70–99)
Potassium: 3.6 mEq/L (ref 3.5–5.1)
Sodium: 133 mEq/L — ABNORMAL LOW (ref 135–145)

## 2012-03-04 MED ORDER — SODIUM CHLORIDE 0.9 % IJ SOLN
INTRAMUSCULAR | Status: AC
  Administered 2012-03-04: 09:00:00
  Filled 2012-03-04: qty 3

## 2012-03-04 MED ORDER — FUROSEMIDE 40 MG PO TABS
40.0000 mg | ORAL_TABLET | Freq: Every day | ORAL | Status: DC
  Administered 2012-03-05: 40 mg via ORAL
  Filled 2012-03-04: qty 1

## 2012-03-04 MED ORDER — SODIUM CHLORIDE 0.9 % IJ SOLN
INTRAMUSCULAR | Status: AC
  Administered 2012-03-05: 08:00:00
  Filled 2012-03-04: qty 3

## 2012-03-04 MED ORDER — SODIUM CHLORIDE 0.9 % IJ SOLN
INTRAMUSCULAR | Status: AC
  Administered 2012-03-04: 10 mL
  Filled 2012-03-04: qty 3

## 2012-03-04 NOTE — H&P (Signed)
Subjective: Patient is still short of breath at rest, wheezing,   Objective: Vital signs in last 24 hours: Temp:  [97.3 F (36.3 C)-97.6 F (36.4 C)] 97.3 F (36.3 C) (05/14 1304) Pulse Rate:  [93-96] 93  (05/14 1304) Resp:  [18] 18  (05/14 1304) BP: (114-147)/(74-83) 130/83 mmHg (05/14 1304) SpO2:  [95 %-98 %] 95 % (05/14 1304) Weight change:  Last BM Date: 03/01/12  Intake/Output from previous day: 05/13 0701 - 05/14 0700 In: 910 [P.O.:910] Out: 1050 [Urine:1050] Total I/O In: 360 [P.O.:360] Out: 300 [Urine:300]   Physical Exam: General: Alert, awake, oriented x3, in no acute distress. HEENT: No bruits, no goiter. Heart: Regular rate and rhythm, without murmurs, rubs, gallops. Lungs: b/l exp wheezes Abdomen: Soft, nontender, nondistended, positive bowel sounds. Extremities: No clubbing cyanosis or edema with positive pedal pulses. Neuro: Grossly intact, nonfocal.    Lab Results: Basic Metabolic Panel:  Basename 03/04/12 0554  NA 133*  K 3.6  CL 100  CO2 20  GLUCOSE 196*  BUN 50*  CREATININE 1.87*  CALCIUM 8.0*  MG --  PHOS --   Liver Function Tests: No results found for this basename: AST:2,ALT:2,ALKPHOS:2,BILITOT:2,PROT:2,ALBUMIN:2 in the last 72 hours No results found for this basename: LIPASE:2,AMYLASE:2 in the last 72 hours No results found for this basename: AMMONIA:2 in the last 72 hours CBC:  Basename 03/04/12 0554 03/02/12 0655  WBC 7.3 7.0  NEUTROABS -- --  HGB 8.5* 8.7*  HCT 25.5* 26.9*  MCV 98.1 100.7*  PLT 236 236   Cardiac Enzymes: No results found for this basename: CKTOTAL:3,CKMB:3,CKMBINDEX:3,TROPONINI:3 in the last 72 hours BNP:  Basename 03/02/12 1125  PROBNP 897.1*   D-Dimer: No results found for this basename: DDIMER:2 in the last 72 hours CBG: No results found for this basename: GLUCAP:6 in the last 72 hours Hemoglobin A1C: No results found for this basename: HGBA1C in the last 72 hours Fasting Lipid Panel: No  results found for this basename: CHOL,HDL,LDLCALC,TRIG,CHOLHDL,LDLDIRECT in the last 72 hours Thyroid Function Tests: No results found for this basename: TSH,T4TOTAL,FREET4,T3FREE,THYROIDAB in the last 72 hours Anemia Panel: No results found for this basename: VITAMINB12,FOLATE,FERRITIN,TIBC,IRON,RETICCTPCT in the last 72 hours Coagulation: No results found for this basename: LABPROT:2,INR:2 in the last 72 hours Urine Drug Screen: Drugs of Abuse  No results found for this basename: labopia, cocainscrnur, labbenz, amphetmu, thcu, labbarb    Alcohol Level: No results found for this basename: ETH:2 in the last 72 hours Urinalysis: No results found for this basename: COLORURINE:2,APPERANCEUR:2,LABSPEC:2,PHURINE:2,GLUCOSEU:2,HGBUR:2,BILIRUBINUR:2,KETONESUR:2,PROTEINUR:2,UROBILINOGEN:2,NITRITE:2,LEUKOCYTESUR:2 in the last 72 hours  Recent Results (from the past 240 hour(s))  CULTURE, BLOOD (SINGLE)     Status: Normal (Preliminary result)   Collection Time   02/29/12  2:25 PM      Component Value Range Status Comment   Specimen Description BLOOD PORTA CATH DRAWN BY RN   Final    Special Requests BOTTLES DRAWN AEROBIC AND ANAEROBIC 8 CC EACH   Final    Culture NO GROWTH 4 DAYS   Final    Report Status PENDING   Incomplete   URINE CULTURE     Status: Normal   Collection Time   02/29/12  2:51 PM      Component Value Range Status Comment   Specimen Description URINE, CLEAN CATCH   Final    Special Requests IMMUNE:COMPRM   Final    Culture  Setup Time [id number]   Final    Colony Count 10,000 COLONIES/ML   Final    Culture ESCHERICHIA COLI  Final    Report Status 03/03/2012 FINAL   Final    Organism ID, Bacteria ESCHERICHIA COLI   Final     Studies/Results: No results found.  Medications: Scheduled Meds:   . albuterol  2.5 mg Nebulization Q4H WA  . amLODipine  5 mg Oral Daily  . antiseptic oral rinse  15 mL Mouth Rinse BID  . folic acid  1 mg Oral Daily  . furosemide  40 mg  Intravenous BID  . guaiFENesin  1,200 mg Oral BID  . heparin  5,000 Units Subcutaneous Q8H  . ipratropium  0.5 mg Nebulization Q4H WA  . levofloxacin  250 mg Oral q1800  . methylPREDNISolone (SOLU-MEDROL) injection  125 mg Intravenous Q6H  . pantoprazole  40 mg Oral Q1200  . sodium chloride      . sodium chloride      . sodium chloride       Continuous Infusions:  PRN Meds:.acetaminophen, acetaminophen, albuterol, ALPRAZolam, diazepam, HYDROcodone-acetaminophen, morphine injection, ondansetron (ZOFRAN) IV, ondansetron  Assessment/Plan:  Principal Problem:  *Acute bronchitis Active Problems:  Essential hypertension, benign  Osteoporosis  Multiple myeloma  Chronic kidney disease, stage 2, mildly decreased GFR  Chronic low back pain  Chronic steroid use  Anemia of chronic disease  Plan:  Patient likely has underlying copd/emphysema from chronic second hand smoke.  She is on high dose steroids, nebs, pulmonary hygiene, abx.  Her breathing was likely exacerbated by a bronchitis.  She has been very slow to progress.  Although she is not on supplemental oxygen, she becomes breathless while talking and is still actively wheezing.  Her son reports that she has had breathing difficulties for some time now.  We will ask pulmonary consultation for any further recommendations regarding her management.  She will likely need formal pulmonary function tests once discharged.  If there is no further recommendations from pulmonary, will anticipate discharge in the next 24-48hrs.   LOS: 4 days   Tamee Battin [facility] Pager: [phone] 03/04/2012, 1:53 PM

## 2012-03-04 NOTE — Consult Note (Signed)
NAME:  BRINLY, MAIETTA                ACCOUNT NO.:  [id number]  MEDICAL RECORD NO.:  [id number]  LOCATION:  A302                          FACILITY:  [facility]  PHYSICIAN:  Morrie Daywalt L. Juanetta Gosling, M.D.DATE OF BIRTH:  05/29/1933  DATE OF CONSULTATION: DATE OF DISCHARGE:                                CONSULTATION   Reason for consultation is shortness of breath.  HISTORY:  Ms. Benedict is a 78 year old who has multiple medical problems. She has been admitted to the hospital because of shortness of breath. She says now she is mostly feeling weak.  She has had some wheezing as well.  She says she has been on a great deal of Lasix.  She has a previous history of pneumonia.  She has no known definite history of COPD, but has a significant exposure to secondhand smoke.  Her past medical history is positive for hypertension, left ventricular hypertrophy, but normal ejection fraction, chronic edema, obesity, osteoporosis, multiple myeloma, GERD, pernicious anemia, chronic kidney disease, peripheral neuropathy from chemotherapy, chronic low back pain.  Surgically, she has had a TAH and BSO with incidental appendectomy, a rotator cuff repair on the left, vein ligation bilaterally, open reduction and internal fixation of an ankle fracture on the right, carpal tunnel release on the right.  SOCIAL HISTORY:  She is a nonsmoker lifelong.  She has had some Baptism exposure in her family.  Her family history is positive for a heart attack in her brother and apparently some COPD.  Her medications at home include Xanax as needed, amlodipine 5 mg daily, Augmentin 1 b.i.d. recently, vitamin D 1000 mg daily, diazepam 5 mg as needed, Colace 100 mg b.i.d., folic acid 1 mg daily, Lasix 40 mg daily, Norco 10/325 q.6 hours as needed, iron 150 mg 3 times a week, Prilosec 20 mg daily, potassium chloride 20 mEq Monday, Wednesday, and Friday, prednisone 5 mg daily, Aldactone 50 mg daily, Levaquin 500 mg daily, Imodium as  needed, Zofran as needed.  Her review of systems except as mentioned is negative.  PHYSICAL EXAMINATION:  GENERAL:  Shows that she is awake and alert, seems to be in no acute distress. HEENT:  Her pupils are reactive.  Nose and throat are clear.  Mucous membranes are moist. NECK:  Supple without masses. CHEST:  Shows decreased breath sounds, some rhonchi, but no wheezes. HEART:  Regular without gallop. ABDOMEN:  Soft.  She has no edema now. CENTRAL NERVOUS SYSTEM:  Grossly intact.  ASSESSMENT:  I think she has probably some chronic obstructive pulmonary disease. She is very weak and I think that is causing her problems as well.  I would plan to continue with inhaled bronchodilators, IV fluids as needed, continue with her other treatments.  She may need some more bronchodilator therapy at home, which might improve her situation.  Thank you very much for allowing me to see her with you.     Sully Manzi L. Juanetta Gosling, M.D.     ELH/MEDQ  D:  03/04/2012  T:  03/04/2012  Job:  [id number]

## 2012-03-04 NOTE — Progress Notes (Signed)
Pt ambulated in hallway utilizing standard walker with nurse.  Ambulated approx. 100 ft.  Tolerated with some SOB and 1 rest period.  O2 sat = 98% after ambulating.

## 2012-03-05 ENCOUNTER — Ambulatory Visit: Payer: Medicare Other | Admitting: Cardiology

## 2012-03-05 ENCOUNTER — Ambulatory Visit (HOSPITAL_COMMUNITY): Payer: Medicare Other | Admitting: Oncology

## 2012-03-05 DIAGNOSIS — I10 Essential (primary) hypertension: Secondary | ICD-10-CM

## 2012-03-05 DIAGNOSIS — J209 Acute bronchitis, unspecified: Secondary | ICD-10-CM

## 2012-03-05 DIAGNOSIS — R112 Nausea with vomiting, unspecified: Secondary | ICD-10-CM

## 2012-03-05 DIAGNOSIS — C9000 Multiple myeloma not having achieved remission: Secondary | ICD-10-CM

## 2012-03-05 LAB — CULTURE, BLOOD (SINGLE)

## 2012-03-05 MED ORDER — ALBUTEROL SULFATE (5 MG/ML) 0.5% IN NEBU: 2.5000 mg | INHALATION_SOLUTION | RESPIRATORY_TRACT | Status: DC | PRN

## 2012-03-05 MED ORDER — SODIUM CHLORIDE 0.9 % IJ SOLN
INTRAMUSCULAR | Status: AC
  Administered 2012-03-05: 10 mL
  Filled 2012-03-05: qty 3

## 2012-03-05 MED ORDER — PREDNISONE (PAK) 10 MG PO TABS: ORAL_TABLET | ORAL | Status: DC

## 2012-03-05 MED ORDER — HEPARIN SOD (PORK) LOCK FLUSH 100 UNIT/ML IV SOLN
500.0000 [IU] | INTRAVENOUS | Status: AC | PRN
  Administered 2012-03-05: 500 [IU]
  Filled 2012-03-05: qty 5

## 2012-03-05 MED ORDER — GUAIFENESIN ER 600 MG PO TB12: 1200.0000 mg | ORAL_TABLET | Freq: Two times a day (BID) | ORAL | Status: AC

## 2012-03-05 NOTE — Progress Notes (Signed)
She says she feels better but still very weak. Her shortness of breath is improved. She was ambulating the hall. She has multiple other medical problems but says she feels like she is improving. She is not coughing.  She is awake and alert. Her pupils are reactive. Her chest shows decreased breath sounds and her respiratory rate is somewhat elevated. Her heart is regular. She does not have any significant edema now.  She has shortness of breath which I think is multifactorial. At least some of this I think is from weakness. She has other problems which could create difficulty with shortness of breath but she does seem to be improving. She has some interest in going home. I don't know she is at baseline because yesterday was the first time I have ever seen her. I think if she is at baseline or approaching baseline he would be okay from a respiratory point of view for her to go home. Otherwise I think another 24 hours may be helpful

## 2012-03-05 NOTE — Discharge Instructions (Signed)

## 2012-03-05 NOTE — Progress Notes (Signed)
Discharge summary sent to payer through [facility]  

## 2012-03-05 NOTE — Discharge Summary (Signed)
Physician Discharge Summary  Patient ID: [name] MRN: [id number] DOB/AGE: [birth year] 78 y.o.  Admit date: 02/29/2012 Discharge date: 03/05/2012  Primary Care Physician:  Sheryl Ruths, MD, MD   Discharge Diagnoses:    Principal Problem:  *Acute bronchitis with possible underlying COPD Active Problems:  Essential hypertension, benign  Osteoporosis  Multiple myeloma  Chronic kidney disease, stage 3  Chronic low back pain  Chronic steroid use  Anemia of chronic disease    Medication List  As of 03/05/2012 10:58 AM   STOP taking these medications         amoxicillin-clavulanate 875-125 MG per tablet      predniSONE 5 MG tablet         TAKE these medications         albuterol (5 MG/ML) 0.5% nebulizer solution   Commonly known as: PROVENTIL   Take 0.5 mLs (2.5 mg total) by nebulization every 4 (four) hours as needed for wheezing or shortness of breath.      ALPRAZolam 0.5 MG tablet   Commonly known as: XANAX   Take 1 tablet (0.5 mg total) by mouth at bedtime as needed for sleep. Tale 1 during day prn for muscle relaxation and 2 hs prn sleep      amLODipine 5 MG tablet   Commonly known as: NORVASC   Take 1 tablet (5 mg total) by mouth daily.      diazepam 5 MG tablet   Commonly known as: VALIUM   Take 5 mg by mouth every 6 (six) hours as needed. For sleep/anxiety, Is taking 2 at bedtime      docusate sodium 100 MG capsule   Commonly known as: COLACE   Take 100 mg by mouth every Monday, Wednesday, and Friday. Takes in the evening      folic acid 1 MG tablet   Commonly known as: FOLVITE   Take 1 mg by mouth daily.      furosemide 40 MG tablet   Commonly known as: LASIX   Take 40 mg by mouth daily.      guaiFENesin 600 MG 12 hr tablet   Commonly known as: MUCINEX   Take 2 tablets (1,200 mg total) by mouth 2 (two) times daily.      HYDROcodone-acetaminophen 10-325 MG per tablet   Commonly known as: NORCO   Take 1-2 tablets, PO, every 6 hours     loperamide 2 MG tablet   Commonly known as: IMODIUM A-D   Take 2 mg by mouth 4 (four) times daily as needed.      NU-IRON 150 MG capsule   Generic drug: iron polysaccharides   Take 150 mg by mouth every Monday, Wednesday, and Friday.      omeprazole 20 MG capsule   Commonly known as: PRILOSEC   Take 1 capsule (20 mg total) by mouth daily.      ondansetron 8 MG tablet   Commonly known as: ZOFRAN   Take 8 mg by mouth every 8 (eight) hours as needed.      potassium chloride SA 20 MEQ tablet   Commonly known as: K-DUR,KLOR-CON   Take 20 mEq by mouth every Monday, Wednesday, and Friday.      predniSONE 10 MG tablet   Commonly known as: STERAPRED UNI-PAK   Take 60mg  po daily for 2 days, then 40mg  po daily for 2 days, then 30mg  po daily for 2 days then 20mg  po daily for 2 days then 10mg  po daily for 2  days then 5mg  daily      spironolactone 50 MG tablet   Commonly known as: ALDACTONE   Take 50 mg by mouth at bedtime.      Vitamin D3 1000 UNITS Caps   Take 1 capsule by mouth daily.           Discharge Exam: Blood pressure 112/76, pulse 78, temperature 97.9 F (36.6 C), temperature source Oral, resp. rate 20, height 5\' 2"  (1.575 m), weight 92.987 kg (205 lb), SpO2 94.00%. NAD Mild wheeze b/l but improved from yesterday S1, S2, RRR Soft, NT, BS+ No edema b/l  Disposition and Follow-up:  Discharge home today with home health, will need outpatient pulmonary function tests  Consults:  Pulmonology, Dr. Juanetta Gosling   Significant Diagnostic Studies:  No results found.  Brief H and P: For complete details please refer to admission H and P, but in brief [name], [name], with a past medical history of multiple myeloma, hypertension, who was in her usual state of health till Sunday, when [name] started developing a cough. The cough has gotten slowly worse over the last few days. [name]'s producing yellowish expectoration, without any blood. [name] also has wheezing, which  is quite significant. Denies any fever, but having said that [name] hasn't checked her temperature. [name] called her oncologist on Monday and was prescribed Augmentin. [name] hasn't felt any improvement with Sheryl medication. Her shortness of breath is increased. However, [name] denies any chest pain. Denies any sick contacts. [name] was diagnosed with pneumonia last year. Denies any nausea, vomiting. Has been feeling very weak. However, [name] has been keeping herself hydrated. [name] denies smoking, however, [name] exposed to secondhand smoke.     Hospital Course:  [name] was admitted to the hospital for shortness of breath which was initially felt to be in acute bronchitis. [name] was started on empiric treatment with steroids, antibiotics, mucolytics. Her progression of her hospital course has been very slow. Chest x-ray suggestive of possible underlying emphysema. [name] did not have a pneumonia on her x-ray. [name] was seen in consultation by Dr. Juanetta Gosling from pulmonology. [name] is ambulating in the halls and is not requiring any supplemental oxygen. [name] does have some dyspnea on exertion with some mild wheezing. It is possible that [name] does has underlying COPD [name] has been exposed to secondhand smoke chronically. [name] will need outpatient pulmonary function tests. Sheryl can be arranged by her primary care doctor. Her family reports that [name] is very close to her baseline at Sheryl point. We will set her up with home health RN, continue bronchodilators, start prednisone taper. Patient be discharged home today. Patient is also a history of multiple myeloma and Sheryl being treated [facility]. It appears [name] is on chronic prednisone therapy by her hematologist. We will taper her prednisone down to her 5 mg daily dose. Further adjustments can be done by her hematologist.  Time spent on Discharge:  Signed: Gianfranco Araki [facility] Pager: [phone] 03/05/2012, 10:58 AM

## 2012-03-05 NOTE — Progress Notes (Signed)
Called by [employer] at [facility] will not approve them. Called several other HH agencies to give pt an option. [facility] is approved by [facility] and Pt approved. [facility] will contact pt in the am and be able to visit Friday or Saturday. Rosemary Holms D

## 2012-03-05 NOTE — Progress Notes (Signed)
Pt discharged home today per Dr. Kerry Hough. Pt's IV site port-a-cath deaccessed and WNL. Pt's VS stable at this time. Pt provided with home medication list, discharge instructions and prescriptions. Pt verbalized understanding. Pt left floor via WC in stable condition accompanied by NT.

## 2012-03-08 ENCOUNTER — Encounter (HOSPITAL_COMMUNITY): Payer: Self-pay

## 2012-03-08 ENCOUNTER — Emergency Department (HOSPITAL_COMMUNITY): Payer: Medicare Other

## 2012-03-08 ENCOUNTER — Observation Stay (HOSPITAL_COMMUNITY)
Admission: EM | Admit: 2012-03-08 | Discharge: 2012-03-12 | Disposition: A | Discharge status: Skilled Nursing Facility | Payer: Medicare Other | Attending: Internal Medicine | Admitting: Internal Medicine

## 2012-03-08 DIAGNOSIS — E871 Hypo-osmolality and hyponatremia: Secondary | ICD-10-CM | POA: Insufficient documentation

## 2012-03-08 DIAGNOSIS — T50905A Adverse effect of unspecified drugs, medicaments and biological substances, initial encounter: Secondary | ICD-10-CM | POA: Diagnosis present

## 2012-03-08 DIAGNOSIS — J4489 Other specified chronic obstructive pulmonary disease: Secondary | ICD-10-CM | POA: Insufficient documentation

## 2012-03-08 DIAGNOSIS — S32599A Other specified fracture of unspecified pubis, initial encounter for closed fracture: Secondary | ICD-10-CM | POA: Diagnosis present

## 2012-03-08 DIAGNOSIS — W010XXA Fall on same level from slipping, tripping and stumbling without subsequent striking against object, initial encounter: Secondary | ICD-10-CM | POA: Insufficient documentation

## 2012-03-08 DIAGNOSIS — J209 Acute bronchitis, unspecified: Secondary | ICD-10-CM | POA: Diagnosis present

## 2012-03-08 DIAGNOSIS — Z6838 Body mass index (BMI) 38.0-38.9, adult: Secondary | ICD-10-CM | POA: Insufficient documentation

## 2012-03-08 DIAGNOSIS — R269 Unspecified abnormalities of gait and mobility: Secondary | ICD-10-CM

## 2012-03-08 DIAGNOSIS — G8929 Other chronic pain: Secondary | ICD-10-CM | POA: Diagnosis present

## 2012-03-08 DIAGNOSIS — E876 Hypokalemia: Secondary | ICD-10-CM | POA: Diagnosis present

## 2012-03-08 DIAGNOSIS — R001 Bradycardia, unspecified: Secondary | ICD-10-CM | POA: Diagnosis present

## 2012-03-08 DIAGNOSIS — S0083XA Contusion of other part of head, initial encounter: Secondary | ICD-10-CM | POA: Diagnosis present

## 2012-03-08 DIAGNOSIS — R5383 Other fatigue: Secondary | ICD-10-CM

## 2012-03-08 DIAGNOSIS — S329XXA Fracture of unspecified parts of lumbosacral spine and pelvis, initial encounter for closed fracture: Secondary | ICD-10-CM

## 2012-03-08 DIAGNOSIS — J40 Bronchitis, not specified as acute or chronic: Secondary | ICD-10-CM

## 2012-03-08 DIAGNOSIS — R5381 Other malaise: Secondary | ICD-10-CM

## 2012-03-08 DIAGNOSIS — C9000 Multiple myeloma not having achieved remission: Secondary | ICD-10-CM | POA: Diagnosis present

## 2012-03-08 DIAGNOSIS — M545 Low back pain, unspecified: Secondary | ICD-10-CM | POA: Diagnosis present

## 2012-03-08 DIAGNOSIS — T380X5A Adverse effect of glucocorticoids and synthetic analogues, initial encounter: Secondary | ICD-10-CM | POA: Insufficient documentation

## 2012-03-08 DIAGNOSIS — E538 Deficiency of other specified B group vitamins: Secondary | ICD-10-CM | POA: Insufficient documentation

## 2012-03-08 DIAGNOSIS — D510 Vitamin B12 deficiency anemia due to intrinsic factor deficiency: Secondary | ICD-10-CM | POA: Diagnosis present

## 2012-03-08 DIAGNOSIS — G629 Polyneuropathy, unspecified: Secondary | ICD-10-CM | POA: Diagnosis present

## 2012-03-08 DIAGNOSIS — S32509A Unspecified fracture of unspecified pubis, initial encounter for closed fracture: Secondary | ICD-10-CM

## 2012-03-08 DIAGNOSIS — E139 Other specified diabetes mellitus without complications: Secondary | ICD-10-CM | POA: Insufficient documentation

## 2012-03-08 DIAGNOSIS — J449 Chronic obstructive pulmonary disease, unspecified: Secondary | ICD-10-CM | POA: Insufficient documentation

## 2012-03-08 DIAGNOSIS — R609 Edema, unspecified: Secondary | ICD-10-CM

## 2012-03-08 DIAGNOSIS — E669 Obesity, unspecified: Secondary | ICD-10-CM | POA: Diagnosis present

## 2012-03-08 DIAGNOSIS — B372 Candidiasis of skin and nail: Secondary | ICD-10-CM | POA: Diagnosis present

## 2012-03-08 DIAGNOSIS — I429 Cardiomyopathy, unspecified: Secondary | ICD-10-CM

## 2012-03-08 DIAGNOSIS — K219 Gastro-esophageal reflux disease without esophagitis: Secondary | ICD-10-CM | POA: Diagnosis present

## 2012-03-08 DIAGNOSIS — I10 Essential (primary) hypertension: Secondary | ICD-10-CM | POA: Diagnosis present

## 2012-03-08 DIAGNOSIS — M6281 Muscle weakness (generalized): Secondary | ICD-10-CM | POA: Insufficient documentation

## 2012-03-08 DIAGNOSIS — D638 Anemia in other chronic diseases classified elsewhere: Secondary | ICD-10-CM | POA: Diagnosis present

## 2012-03-08 DIAGNOSIS — IMO0002 Reserved for concepts with insufficient information to code with codable children: Secondary | ICD-10-CM | POA: Insufficient documentation

## 2012-03-08 DIAGNOSIS — N182 Chronic kidney disease, stage 2 (mild): Secondary | ICD-10-CM | POA: Diagnosis present

## 2012-03-08 DIAGNOSIS — D696 Thrombocytopenia, unspecified: Secondary | ICD-10-CM | POA: Diagnosis present

## 2012-03-08 HISTORY — DX: Thrombocytopenia, unspecified: D69.6

## 2012-03-08 HISTORY — DX: Adverse effect of unspecified drugs, medicaments and biological substances, initial encounter: T50.905A

## 2012-03-08 HISTORY — DX: Hyperglycemia, unspecified: R73.9

## 2012-03-08 HISTORY — DX: Other specified fracture of unspecified pubis, initial encounter for closed fracture: S32.599A

## 2012-03-08 LAB — DIFFERENTIAL
Band Neutrophils: 0 % (ref 0–10)
Blasts: 0 %
Eosinophils Absolute: 0 10*3/uL (ref 0.0–0.7)
Eosinophils Relative: 0 % (ref 0–5)
Metamyelocytes Relative: 0 %
Monocytes Absolute: 1.6 10*3/uL — ABNORMAL HIGH (ref 0.1–1.0)
Monocytes Relative: 9 % (ref 3–12)

## 2012-03-08 LAB — CBC
HCT: 30.1 % — ABNORMAL LOW (ref 36.0–46.0)
MCV: 98.4 fL (ref 78.0–100.0)
RDW: 15.7 % — ABNORMAL HIGH (ref 11.5–15.5)
WBC: 18.3 10*3/uL — ABNORMAL HIGH (ref 4.0–10.5)

## 2012-03-08 LAB — BASIC METABOLIC PANEL
GFR calc Af Amer: 38 mL/min — ABNORMAL LOW (ref >90)
GFR calc non Af Amer: 33 mL/min — ABNORMAL LOW (ref >90)
Potassium: 3.3 mEq/L — ABNORMAL LOW (ref 3.5–5.1)
Sodium: 132 mEq/L — ABNORMAL LOW (ref 135–145)

## 2012-03-08 LAB — PROTIME-INR: INR: 0.97 (ref 0.00–1.49)

## 2012-03-08 MED ORDER — TRAZODONE HCL 50 MG PO TABS
25.0000 mg | ORAL_TABLET | Freq: Every evening | ORAL | Status: DC | PRN
  Administered 2012-03-08 – 2012-03-09 (×2): 25 mg via ORAL
  Filled 2012-03-08 (×2): qty 1

## 2012-03-08 MED ORDER — HYDROCODONE-ACETAMINOPHEN 5-325 MG PO TABS
2.0000 | ORAL_TABLET | Freq: Once | ORAL | Status: AC
  Administered 2012-03-08: 2 via ORAL
  Filled 2012-03-08: qty 2

## 2012-03-08 MED ORDER — DIAZEPAM 5 MG PO TABS: 5.0000 mg | ORAL_TABLET | Freq: Four times a day (QID) | ORAL | Status: DC | PRN

## 2012-03-08 MED ORDER — ACETAMINOPHEN 325 MG PO TABS: 650.0000 mg | ORAL_TABLET | Freq: Four times a day (QID) | ORAL | Status: DC | PRN

## 2012-03-08 MED ORDER — ONDANSETRON HCL 4 MG PO TABS: 4.0000 mg | ORAL_TABLET | Freq: Four times a day (QID) | ORAL | Status: DC | PRN

## 2012-03-08 MED ORDER — PREDNISONE 20 MG PO TABS
20.0000 mg | ORAL_TABLET | Freq: Every day | ORAL | Status: DC
  Administered 2012-03-12: 20 mg via ORAL
  Filled 2012-03-08: qty 1

## 2012-03-08 MED ORDER — HYDROMORPHONE HCL PF 1 MG/ML IJ SOLN: 0.5000 mg | INTRAMUSCULAR | Status: DC | PRN

## 2012-03-08 MED ORDER — FOLIC ACID 1 MG PO TABS
1.0000 mg | ORAL_TABLET | Freq: Every day | ORAL | Status: DC
  Administered 2012-03-09 – 2012-03-12 (×4): 1 mg via ORAL
  Filled 2012-03-08 (×4): qty 1

## 2012-03-08 MED ORDER — GUAIFENESIN ER 600 MG PO TB12
1200.0000 mg | ORAL_TABLET | Freq: Two times a day (BID) | ORAL | Status: DC
  Administered 2012-03-08 – 2012-03-12 (×8): 1200 mg via ORAL
  Filled 2012-03-08 (×8): qty 2

## 2012-03-08 MED ORDER — PREDNISONE 20 MG PO TABS
30.0000 mg | ORAL_TABLET | Freq: Every day | ORAL | Status: AC
  Administered 2012-03-10 – 2012-03-11 (×2): 30 mg via ORAL
  Filled 2012-03-08 (×2): qty 1

## 2012-03-08 MED ORDER — AMLODIPINE BESYLATE 5 MG PO TABS
5.0000 mg | ORAL_TABLET | Freq: Every day | ORAL | Status: DC
  Administered 2012-03-09 – 2012-03-12 (×4): 5 mg via ORAL
  Filled 2012-03-08 (×4): qty 1

## 2012-03-08 MED ORDER — SODIUM CHLORIDE 0.9 % IJ SOLN
INTRAMUSCULAR | Status: AC
  Administered 2012-03-09: 10 mL
  Filled 2012-03-08: qty 3

## 2012-03-08 MED ORDER — INSULIN ASPART 100 UNIT/ML ~~LOC~~ SOLN: 0.0000 [IU] | Freq: Every day | SUBCUTANEOUS | Status: DC

## 2012-03-08 MED ORDER — FUROSEMIDE 40 MG PO TABS
40.0000 mg | ORAL_TABLET | Freq: Every day | ORAL | Status: DC
  Administered 2012-03-09 – 2012-03-11 (×3): 40 mg via ORAL
  Filled 2012-03-08 (×4): qty 1

## 2012-03-08 MED ORDER — PREDNISONE 20 MG PO TABS
40.0000 mg | ORAL_TABLET | Freq: Every day | ORAL | Status: AC
  Administered 2012-03-09: 40 mg via ORAL
  Filled 2012-03-08: qty 2

## 2012-03-08 MED ORDER — ALBUTEROL SULFATE (5 MG/ML) 0.5% IN NEBU: 2.5000 mg | INHALATION_SOLUTION | RESPIRATORY_TRACT | Status: DC | PRN

## 2012-03-08 MED ORDER — ONDANSETRON HCL 4 MG/2ML IJ SOLN: 4.0000 mg | Freq: Four times a day (QID) | INTRAMUSCULAR | Status: DC | PRN

## 2012-03-08 MED ORDER — INSULIN ASPART 100 UNIT/ML ~~LOC~~ SOLN
0.0000 [IU] | Freq: Three times a day (TID) | SUBCUTANEOUS | Status: DC
  Administered 2012-03-09: 3 [IU] via SUBCUTANEOUS
  Administered 2012-03-09: 2 [IU] via SUBCUTANEOUS
  Administered 2012-03-10: 5 [IU] via SUBCUTANEOUS
  Administered 2012-03-11: 3 [IU] via SUBCUTANEOUS

## 2012-03-08 MED ORDER — PREDNISONE 10 MG PO TABS: 10.0000 mg | ORAL_TABLET | Freq: Every day | ORAL | Status: DC

## 2012-03-08 MED ORDER — ENOXAPARIN SODIUM 40 MG/0.4ML ~~LOC~~ SOLN
40.0000 mg | SUBCUTANEOUS | Status: DC
Start: 2012-03-09 — End: 2012-03-11
  Administered 2012-03-09 – 2012-03-10 (×2): 40 mg via SUBCUTANEOUS
  Filled 2012-03-08 (×2): qty 0.4

## 2012-03-08 MED ORDER — POTASSIUM CHLORIDE CRYS ER 20 MEQ PO TBCR: 20.0000 meq | EXTENDED_RELEASE_TABLET | ORAL | Status: DC

## 2012-03-08 MED ORDER — SPIRONOLACTONE 25 MG PO TABS
50.0000 mg | ORAL_TABLET | Freq: Every day | ORAL | Status: DC
  Administered 2012-03-08 – 2012-03-10 (×3): 50 mg via ORAL
  Filled 2012-03-08 (×3): qty 2

## 2012-03-08 MED ORDER — OXYCODONE HCL 5 MG PO TABS
5.0000 mg | ORAL_TABLET | ORAL | Status: DC | PRN
  Administered 2012-03-08: 5 mg via ORAL
  Administered 2012-03-09 – 2012-03-12 (×9): 10 mg via ORAL
  Filled 2012-03-08 (×5): qty 2
  Filled 2012-03-08: qty 1
  Filled 2012-03-08 (×5): qty 2

## 2012-03-08 MED ORDER — DOCUSATE SODIUM 100 MG PO CAPS
100.0000 mg | ORAL_CAPSULE | ORAL | Status: DC
  Administered 2012-03-10 – 2012-03-12 (×2): 100 mg via ORAL
  Filled 2012-03-08 (×2): qty 1

## 2012-03-08 MED ORDER — IPRATROPIUM BROMIDE 0.02 % IN SOLN
0.5000 mg | Freq: Once | RESPIRATORY_TRACT | Status: AC
  Administered 2012-03-08: 0.5 mg via RESPIRATORY_TRACT
  Filled 2012-03-08: qty 2.5

## 2012-03-08 MED ORDER — PANTOPRAZOLE SODIUM 40 MG PO TBEC
40.0000 mg | DELAYED_RELEASE_TABLET | Freq: Every day | ORAL | Status: DC
  Administered 2012-03-09 – 2012-03-12 (×4): 40 mg via ORAL
  Filled 2012-03-08 (×3): qty 1

## 2012-03-08 MED ORDER — ACETAMINOPHEN 650 MG RE SUPP: 650.0000 mg | Freq: Four times a day (QID) | RECTAL | Status: DC | PRN

## 2012-03-08 MED ORDER — ALBUTEROL SULFATE (5 MG/ML) 0.5% IN NEBU
2.5000 mg | INHALATION_SOLUTION | Freq: Once | RESPIRATORY_TRACT | Status: AC
  Administered 2012-03-08: 2.5 mg via RESPIRATORY_TRACT
  Filled 2012-03-08: qty 0.5

## 2012-03-08 NOTE — ED Provider Notes (Signed)
History  This chart was scribed for Glynn Octave, MD by Cherlynn Perches. The patient was seen in room APA04/APA04. Patient's care was started at 1744.      CSN: [id number]  Arrival date & time 03/08/12  1744   First MD Initiated Contact with Patient 03/08/12 1754      Chief Complaint  Patient presents with  . Hip Pain    (Consider location/radiation/quality/duration/timing/severity/associated sxs/prior treatment) HPI  [name] is a 78 y.o. female with a h/o HTN, chronic kidney disease, and GERD who presents to the Emergency Department complaining of 2 hours of sudden onset, unchanged, constant mild hip pain with associated abrasions to the left cheek and bilateral elbows. Pt states that symptoms were caused by a fall. Pt states that she was trying to sit in a chair and missed the chair. Pt hit the left side of her face and left hip. Pt reports that she has not been able to walk since the fall. Pt is currently being treated for multiple myeloma and is on antibiotics for bronchitis. Pt denies light-headedness, dizziness, loss of consciousness, neck pain, HA, abdominal pain, and back pain. Pt denies taking blood thinners. Pt denies smoking and alcohol use.     Past Medical History  Diagnosis Date  . Essential hypertension, benign     LVH; RVH; normal EF in 04/2011 with the basilar inferior wall akinesis  . Edema   . Obesity   . Osteoporosis     Lumbar and thoracic compression fractures  . Multiple myeloma 2007    IgA lambda  . Gastroesophageal reflux disease   . Pernicious anemia     Plus thrombocytopenia;H./H.-9.8/29 in 12/2010  . Chronic kidney disease, stage 2, mildly decreased GFR     Creatinine of 1.3 in 09/2010; acute renal failure in 11/2010 secondary to diuretics with hyperkalemia secondary to Spiriva lactone  . Peripheral neuropathy     Complicating chemotherapy  . Chronic low back pain   . Chronic steroid use   . History of candidiasis   . Allergy    Revlimid  . Anemia of chronic disease   . B12 deficiency   . COPD (chronic obstructive pulmonary disease)     Past Surgical History  Procedure Date  . Total abdominal hysterectomy w/ bilateral salpingoophorectomy     Incidental appendectomy  . Rotator cuff repair     Left  . Vein surgery     Ligation and stripping bilaterally  . Orif ankle fracture     Right  . Carpal tunnel release     Right  . Central venous catheter tunneled insertion single lumen 2012    Family History  Problem Relation Age of Onset  . Heart attack Brother     History  Substance Use Topics  . Smoking status: Never Smoker   . Smokeless tobacco: Never Used  . Alcohol Use: No    OB History    Grav Para Term Preterm Abortions TAB SAB Ect Mult Living                  Review of Systems  Constitutional: Negative for fever and chills.  HENT: Negative for ear pain and neck pain.   Respiratory: Negative for cough and shortness of breath.   Cardiovascular: Negative for chest pain.  Gastrointestinal: Negative for nausea, vomiting and abdominal pain.  Genitourinary: Negative for dysuria and urgency.  Musculoskeletal: Positive for myalgias and arthralgias. Negative for back pain.  Skin: Negative for wound.  Neurological: Negative  for weakness, light-headedness and numbness.  Psychiatric/Behavioral: Negative for confusion.  All other systems reviewed and are negative.    Allergies  Lenalidomide  Home Medications   Current Outpatient Rx  Name Route Sig Dispense Refill  . ALBUTEROL SULFATE (5 MG/ML) 0.5% IN NEBU Nebulization Take 0.5 mLs (2.5 mg total) by nebulization every 4 (four) hours as needed for wheezing or shortness of breath. 20 mL 0  . ALPRAZOLAM 0.5 MG PO TABS Oral Take 1 tablet (0.5 mg total) by mouth at bedtime as needed for sleep. Tale 1 during day prn for muscle relaxation and 2 hs prn sleep 90 tablet 1  . AMLODIPINE BESYLATE 5 MG PO TABS Oral Take 1 tablet (5 mg total) by mouth daily.  30 tablet 2  . VITAMIN D3 1000 UNITS PO CAPS Oral Take 1 capsule by mouth daily.    Marland Kitchen DIAZEPAM 5 MG PO TABS Oral Take 5 mg by mouth every 6 (six) hours as needed. For sleep/anxiety, Is taking 2 at bedtime    . DOCUSATE SODIUM 100 MG PO CAPS Oral Take 100 mg by mouth every Monday, Wednesday, and Friday. Takes in the evening    . FOLIC ACID 1 MG PO TABS Oral Take 1 mg by mouth daily.      . FUROSEMIDE 40 MG PO TABS Oral Take 40 mg by mouth daily.    . GUAIFENESIN ER 600 MG PO TB12 Oral Take 2 tablets (1,200 mg total) by mouth 2 (two) times daily. 14 tablet 0  . HYDROCODONE-ACETAMINOPHEN 10-325 MG PO TABS  Take 1-2 tablets, PO, every 6 hours 120 tablet 2  . POLYSACCHARIDE IRON COMPLEX 150 MG PO CAPS Oral Take 150 mg by mouth every Monday, Wednesday, and Friday.     Marland Kitchen LOPERAMIDE HCL 2 MG PO TABS Oral Take 2 mg by mouth 4 (four) times daily as needed.    Marland Kitchen OMEPRAZOLE 20 MG PO CPDR Oral Take 1 capsule (20 mg total) by mouth daily. 30 capsule 4  . ONDANSETRON HCL 8 MG PO TABS Oral Take 8 mg by mouth every 8 (eight) hours as needed.    Marland Kitchen POTASSIUM CHLORIDE CRYS ER 20 MEQ PO TBCR Oral Take 20 mEq by mouth every Monday, Wednesday, and Friday.     Marland Kitchen PREDNISONE (PAK) 10 MG PO TABS  Take 60mg  po daily for 2 days, then 40mg  po daily for 2 days, then 30mg  po daily for 2 days then 20mg  po daily for 2 days then 10mg  po daily for 2 days then 5mg  daily 30 tablet 0  . SPIRONOLACTONE 50 MG PO TABS Oral Take 50 mg by mouth at bedtime.      BP 127/61  Pulse 89  Temp(Src) 98.3 F (36.8 C) (Oral)  Ht 5\' 2"  (1.575 m)  Wt 210 lb (95.255 kg)  BMI 38.41 kg/m2  SpO2 89%  Physical Exam  Nursing note and vitals reviewed. Constitutional: She is oriented to person, place, and time. She appears well-developed and well-nourished.  HENT:  Head: Normocephalic and atraumatic.  Eyes: Conjunctivae are normal. No scleral icterus.  Neck: Normal range of motion. Neck supple.  Cardiovascular: Normal rate and regular rhythm.   Exam reveals no gallop and no friction rub.   No murmur heard. Pulmonary/Chest: Effort normal. No respiratory distress.  Abdominal: Soft. There is no tenderness.  Musculoskeletal: Normal range of motion. She exhibits no edema.       Tender to palpation over left hip, no pain with log rolling, no deformity,  no shortening  Neurological: She is alert and oriented to person, place, and time. Coordination normal.  Skin: Skin is warm and dry.       73m abrasion/skin tear to left cheek, skin tear to right elbow and left elbow  Psychiatric: She has a normal mood and affect. Her behavior is normal.    ED Course  Procedures (including critical care time)  DIAGNOSTIC STUDIES: Oxygen Saturation is 91% on room air, low by my interpretation.    COORDINATION OF CARE: 6:08PM - Patient understands and agrees with initial ED impression and plan with expectations set for ED visit. 7:01PM - Re-evaluated pt, discussed scans and admission. Pt agrees. 7:46PM - Re-evaluated pt. Discussed admission. Pt agrees.    Labs Reviewed - No data to display Dg Chest 1 View  03/08/2012  *RADIOLOGY REPORT*  Clinical Data: Shortness of breath, weakness.  History of fall today.  CHEST - 1 VIEW  Comparison: Chest x-ray 03/02/2012.  Findings: Lungs are well expanded bilaterally.  There are linear opacities in the left lower lobe, favored to represent areas of subsegmental atelectasis.  No definite consolidative airspace disease.  No definite pleural effusions.  Pulmonary vasculature is within normal limits.  Heart size is mildly enlarged.  Upper mediastinal contours appears slightly widened, however, this is likely accentuated by the lordotic positioning.  Atherosclerotic calcifications within the arch of the aorta.  A right subclavian single lumen Port-A-Cath is in position with tip terminating in the mid superior vena cava.  Compared to the prior examination, there appears to be a healing nondisplaced fracture of the mid left  clavicle.  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  IMPRESSION: 1.  Small amount of subsegmental atelectasis in the left lower lobe.  No other radiographic evidence of acute cardiopulmonary disease. 2.  Atherosclerosis. 3.  Mild cardiomegaly with prominence of the left ventricular contour which may suggest underlying left ventricular hypertrophy. 4.  Healing fracture of the left mid clavicle, and the old fracture through the base of the acromion process with chronic nonunion, as above.  Original Report Authenticated By: Florencia Reasons, M.D.   Dg Hip Complete Left  03/08/2012  *RADIOLOGY REPORT*  Clinical Data: Hip pain status post fall today.  LEFT HIP - COMPLETE 2+ VIEW  Comparison: No priors.  Findings: There are acute minimally displaced fractures through the left superior and inferior pubic ramus, with probable extension into the parasymphyseal region of the pubis on the left.  No additional displaced pelvic fractures are otherwise noted.  The visualized proximal femurs are intact, and the left femoral head is properly located.  Mild degenerative changes of osteoarthritis are noted the hip joints bilaterally.  IMPRESSION: 1.  Acute fracture of the left pubic bone involving the left superior and inferior pubic rami with probable extension into the left parasymphyseal region.  Original Report Authenticated By: Florencia Reasons, M.D.     No diagnosis found.    MDM  Patient discharged 2 days ago after stay for bronchitis. A mechanical fall today where she fell onto her left side and she missed her chair. He did not hit her head or lose consciousness. She is pain in her left hip and groin. There is no weakness, numbness or tingling. She has skin tears to her face and bilateral elbows. she is not on blood thinners.  Borderline hypoxia with scattered wheezing.  X-rays remarkable for pubic ramus fractures. Patient unable to ambulate secondary  to pain and deconditioning. It seems as her bronchitis is recurring and she's been having more shortness of breath and increased work of breathing since in the ED. She is given 2 breathing treatments with good relief. We'll admit for recurrent dyspnea, new pelvis fractures, physical deconditioning.  I personally performed the services described in this documentation, which was scribed in my presence.  The recorded information has been reviewed and considered.    Glynn Octave, MD 03/08/12 [phone]

## 2012-03-08 NOTE — ED Notes (Signed)
Respiratory paged for breathing treatment 

## 2012-03-08 NOTE — ED Notes (Signed)
EMS reports pt fell today around 3:30 pm.  Says she missed the chair when tried to sit down.  C/O pain to left hip.  No LOC.  Reports skin tears to r arm, left eblow and left side of face.

## 2012-03-08 NOTE — H&P (Signed)
PCP:   [name], MD, MD   Chief Complaint:  Fall and inability to walk this afternoon  HPI: [name] is an 78 y.o. female.  Discharge from the service 3 days ago after treatment for exacerbation of COPD. Patient has been feeling very weak since discharge from hospital.  While turning  to adjust her chair at in her room, sustained a mechanical fall and has been having difficulty standing since. Patient hit the left side of her face sustained abrasions but did not lose consciousness. Came to the emergency room where x-rays revealed fractures of the left superior and inferior pubic rami and the hospitalist service was called to assist with management.  Denies fever cough or cold worsening chest pain or worsening shortness of breath. Reports that her lung symptoms have not gotten worse since discharge from hospital.  Ambulates with a walker, and was using her walker when she fell.  Rewiew of Systems:  The patient denies anorexia, fever, weight loss,, vision loss, decreased hearing, hoarseness, chest pain, syncope, peripheral edema, balance deficits, hemoptysis, abdominal pain, melena, hematochezia, severe indigestion/heartburn, hematuria, incontinence, genital sores, muscle weakness, suspicious skin lesions, transient blindness,  depression, unusual weight change, abnormal bleeding, enlarged lymph nodes, angioedema, and breast masses.    Past Medical History  Diagnosis Date  . Essential hypertension, benign     LVH; RVH; normal EF in 04/2011 with the basilar inferior wall akinesis  . Edema   . Obesity   . Osteoporosis     Lumbar and thoracic compression fractures  . Multiple myeloma 2007    IgA lambda  . Gastroesophageal reflux disease   . Pernicious anemia     Plus thrombocytopenia;H./H.-9.8/29 in 12/2010  . Chronic kidney disease, stage 2, mildly decreased GFR     Creatinine of 1.3 in 09/2010; acute renal failure in 11/2010 secondary to diuretics with hyperkalemia secondary  to Spiriva lactone  . Peripheral neuropathy     Complicating chemotherapy  . Chronic low back pain   . Chronic steroid use   . History of candidiasis   . Allergy     Revlimid  . Anemia of chronic disease   . B12 deficiency   . COPD (chronic obstructive pulmonary disease)     Past Surgical History  Procedure Date  . Total abdominal hysterectomy w/ bilateral salpingoophorectomy     Incidental appendectomy  . Rotator cuff repair     Left  . Vein surgery     Ligation and stripping bilaterally  . Orif ankle fracture     Right  . Carpal tunnel release     Right  . Central venous catheter tunneled insertion single lumen 2012    Medications:  HOME MEDS: Prior to Admission medications   Medication Sig Start Date End Date Taking? Authorizing Provider  albuterol (PROVENTIL) (5 MG/ML) 0.5% nebulizer solution Take 0.5 mLs (2.5 mg total) by nebulization every 4 (four) hours as needed for wheezing or shortness of breath. 03/05/12 03/05/13 Yes Erick Blinks, MD  amLODipine (NORVASC) 5 MG tablet Take 1 tablet (5 mg total) by mouth daily. 01/28/12 01/27/13 Yes Ellouise Newer, PA  Cholecalciferol (VITAMIN D3) 1000 UNITS CAPS Take 1 capsule by mouth daily.   Yes Historical Provider, MD  diazepam (VALIUM) 5 MG tablet Take 5 mg by mouth every 6 (six) hours as needed. For sleep/anxiety, Is taking 2 at bedtime 12/21/11  Yes Ellouise Newer, PA  docusate sodium (COLACE) 100 MG capsule Take 100 mg by mouth every Monday,  Wednesday, and Friday. Takes in the evening   Yes Historical Provider, MD  folic acid (FOLVITE) 1 MG tablet Take 1 mg by mouth daily.     Yes Historical Provider, MD  furosemide (LASIX) 40 MG tablet Take 40 mg by mouth daily.   Yes Historical Provider, MD  guaiFENesin (MUCINEX) 600 MG 12 hr tablet Take 2 tablets (1,200 mg total) by mouth 2 (two) times daily. 03/05/12 03/05/13 Yes Erick Blinks, MD  HYDROcodone-acetaminophen (NORCO) 10-325 MG per tablet Take 1 tablet by mouth every 6 (six)  hours as needed. pain 02/20/12  Yes Ellouise Newer, PA  iron polysaccharides (NU-IRON) 150 MG capsule Take 150 mg by mouth every Monday, Wednesday, and Friday.    Yes Historical Provider, MD  loperamide (IMODIUM A-D) 2 MG tablet Take 2 mg by mouth 4 (four) times daily as needed.   Yes Historical Provider, MD  omeprazole (PRILOSEC) 20 MG capsule Take 1 capsule (20 mg total) by mouth daily. 05/31/11  Yes Ellouise Newer, PA  ondansetron (ZOFRAN) 8 MG tablet Take 8 mg by mouth every 8 (eight) hours as needed. nausea   Yes Historical Provider, MD  potassium chloride SA (K-DUR,KLOR-CON) 20 MEQ tablet Take 20 mEq by mouth every Monday, Wednesday, and Friday.    Yes Historical Provider, MD  predniSONE (STERAPRED UNI-PAK) 10 MG tablet Take 60mg  po daily for 2 days, then 40mg  po daily for 2 days, then 30mg  po daily for 2 days then 20mg  po daily for 2 days then 10mg  po daily for 2 days then 5mg  daily 03/05/12  Yes Erick Blinks, MD  spironolactone (ALDACTONE) 50 MG tablet Take 50 mg by mouth at bedtime. 01/29/12  Yes Randall An, MD  ALPRAZolam Prudy Feeler) 0.5 MG tablet Take 1 tablet (0.5 mg total) by mouth at bedtime as needed for sleep. Tale 1 during day prn for muscle relaxation and 2 hs prn sleep 02/06/12 03/07/12  Randall An, MD     Allergies:  Allergies  Allergen Reactions  . Lenalidomide Rash    Social History:   reports that she has never smoked. She has never used smokeless tobacco. She reports that she does not drink alcohol or use illicit drugs.  Family History: Family History  Problem Relation Age of Onset  . Heart attack Brother      Physical Exam: Filed Vitals:   03/08/12 1747 03/08/12 1753 03/08/12 1825 03/08/12 2053  BP:  127/61  129/55  Pulse:  89  86  Temp:  98.3 F (36.8 C)    TempSrc:  Oral    Resp:    16  Height: 5\' 2"  (1.575 m) 5\' 2"  (1.575 m)    Weight: 95.255 kg (210 lb) 95.255 kg (210 lb)    SpO2:  91% 89% 94%   Blood pressure 129/55, pulse 86, temperature  98.3 F (36.8 C), temperature source Oral, resp. rate 16, height 5\' 2"  (1.575 m), weight 95.255 kg (210 lb), SpO2 94.00%.  GEN:  Pleasant obese lying in the stretcher, weak looking; cooperative with exam PSYCH:  alert and oriented x4;  HEENT: Mucous membranes pink and anicteric; PERRLA; EOM intact; no cervical lymphadenopathy nor thyromegaly or carotid bruit; extensive bruising of the left cheek; bleeding covered by a large bandage. Breasts:: Not examined CHEST WALL: No tenderness CHEST: Normal respiration, clear to auscultation bilaterally HEART: Regular rate and rhythm; no murmurs rubs or gallops BACK: Moderate kyphosis no scoliosis; no CVA tenderness ABDOMEN: Obese, soft non-tender; no masses, no organomegaly, normal  abdominal bowel sounds; no pannus; no intertriginous candida. Rectal Exam: Not done EXTREMITIES:  age-appropriate arthropathy of the hands and knees; no edema; no ulcerations. Left-sided pain when springing the pelvis Genitalia: not examined PULSES: 2+ and symmetric SKIN: Normal hydration no rash or ulceration CNS: Cranial nerves 2-12 grossly intact no focal lateralizing neurologic deficit   Labs & Imaging Results for orders placed during the hospital encounter of 03/08/12 (from the past 48 hour(s))  CBC     Status: Abnormal   Collection Time   03/08/12  7:29 PM      Component Value Range Comment   WBC 18.3 (*) 4.0 - 10.5 (K/uL)    RBC 3.06 (*) 3.87 - 5.11 (MIL/uL)    Hemoglobin 10.2 (*) 12.0 - 15.0 (g/dL)    HCT 16.1 (*) 09.6 - 46.0 (%)    MCV 98.4  78.0 - 100.0 (fL)    MCH 33.3  26.0 - 34.0 (pg)    MCHC 33.9  30.0 - 36.0 (g/dL)    RDW 04.5 (*) 40.9 - 15.5 (%)    Platelets 155  150 - 400 (K/uL)   DIFFERENTIAL     Status: Abnormal   Collection Time   03/08/12  7:29 PM      Component Value Range Comment   Neutrophils Relative 89 (*) 43 - 77 (%)    Lymphocytes Relative 2 (*) 12 - 46 (%)    Monocytes Relative 9  3 - 12 (%)    Eosinophils Relative 0  0 - 5 (%)     Basophils Relative 0  0 - 1 (%)    Band Neutrophils 0  0 - 10 (%)    Metamyelocytes Relative 0      Myelocytes 0      Promyelocytes Absolute 0      Blasts 0      nRBC 0  0 (/100 WBC)    Neutro Abs 16.3 (*) 1.7 - 7.7 (K/uL)    Lymphs Abs 0.4 (*) 0.7 - 4.0 (K/uL)    Monocytes Absolute 1.6 (*) 0.1 - 1.0 (K/uL)    Eosinophils Absolute 0.0  0.0 - 0.7 (K/uL)    Basophils Absolute 0.0  0.0 - 0.1 (K/uL)   BASIC METABOLIC PANEL     Status: Abnormal   Collection Time   03/08/12  7:29 PM      Component Value Range Comment   Sodium 132 (*) 135 - 145 (mEq/L)    Potassium 3.3 (*) 3.5 - 5.1 (mEq/L)    Chloride 97  96 - 112 (mEq/L)    CO2 22  19 - 32 (mEq/L)    Glucose, Bld 230 (*) 70 - 99 (mg/dL)    BUN 47 (*) 6 - 23 (mg/dL)    Creatinine, Ser 8.11 (*) 0.50 - 1.10 (mg/dL)    Calcium 9.0  8.4 - 10.5 (mg/dL)    GFR calc non Af Amer 33 (*) >90 (mL/min)    GFR calc Af Amer 38 (*) >90 (mL/min)   PROTIME-INR     Status: Normal   Collection Time   03/08/12  7:29 PM      Component Value Range Comment   Prothrombin Time 13.1  11.6 - 15.2 (seconds)    INR 0.97  0.00 - 1.49     Dg Chest 1 View  03/08/2012  *RADIOLOGY REPORT*  Clinical Data: Shortness of breath, weakness.  History of fall today.  CHEST - 1 VIEW  Comparison: Chest x-ray 03/02/2012.  Findings: Lungs are  well expanded bilaterally.  There are linear opacities in the left lower lobe, favored to represent areas of subsegmental atelectasis.  No definite consolidative airspace disease.  No definite pleural effusions.  Pulmonary vasculature is within normal limits.  Heart size is mildly enlarged.  Upper mediastinal contours appears slightly widened, however, this is likely accentuated by the lordotic positioning.  Atherosclerotic calcifications within the arch of the aorta.  A right subclavian single lumen Port-A-Cath is in position with tip terminating in the mid superior vena cava.  Compared to the prior examination, there appears to be a healing  nondisplaced fracture of the mid left clavicle.  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  IMPRESSION: 1.  Small amount of subsegmental atelectasis in the left lower lobe.  No other radiographic evidence of acute cardiopulmonary disease. 2.  Atherosclerosis. 3.  Mild cardiomegaly with prominence of the left ventricular contour which may suggest underlying left ventricular hypertrophy. 4.  Healing fracture of the left mid clavicle, and the old fracture through the base of the acromion process with chronic nonunion, as above.  Original Report Authenticated By: Florencia Reasons, M.D.   Dg Hip Complete Left  03/08/2012  *RADIOLOGY REPORT*  Clinical Data: Hip pain status post fall today.  LEFT HIP - COMPLETE 2+ VIEW  Comparison: No priors.  Findings: There are acute minimally displaced fractures through the left superior and inferior pubic ramus, with probable extension into the parasymphyseal region of the pubis on the left.  No additional displaced pelvic fractures are otherwise noted.  The visualized proximal femurs are intact, and the left femoral head is properly located.  Mild degenerative changes of osteoarthritis are noted the hip joints bilaterally.  IMPRESSION: 1.  Acute fracture of the left pubic bone involving the left superior and inferior pubic rami with probable extension into the left parasymphyseal region.  Original Report Authenticated By: Florencia Reasons, M.D.      Assessment Present on Admission:  .Physical deconditioning .Fracture Of Multiple Pubic Rami .Acute bronchitis  Hyperglycemia, probably steroid induced Leukocytosis likely steroid or stress-induced  Duration of left cheek secondary to fall  .Essential hypertension, benign .Obesity .Multiple myeloma .Gastroesophageal reflux disease .Peripheral neuropathy .Anemia of chronic disease .Pernicious anemia  .Chronic kidney disease, stage 2, mildly decreased  GFR .Chronic low back pain   PLAN: Admit this lady for physical therapy; she will likely need placement or rehabilitation before returning home; Continue her home medications, occluding her steroid taper.  Check hemoglobin A1c and give sliding scale insulin as indicated. Since she is being prepared for treatment for multiple myeloma, will notify Dr. Mariel Sleet of her admission.  Other plans as per orders.   Aseel Uhde 03/08/2012, 9:20 PM

## 2012-03-09 ENCOUNTER — Encounter (HOSPITAL_COMMUNITY): Payer: Self-pay | Admitting: Internal Medicine

## 2012-03-09 DIAGNOSIS — S32509A Unspecified fracture of unspecified pubis, initial encounter for closed fracture: Secondary | ICD-10-CM

## 2012-03-09 DIAGNOSIS — R739 Hyperglycemia, unspecified: Secondary | ICD-10-CM

## 2012-03-09 DIAGNOSIS — E871 Hypo-osmolality and hyponatremia: Secondary | ICD-10-CM

## 2012-03-09 DIAGNOSIS — E876 Hypokalemia: Secondary | ICD-10-CM | POA: Diagnosis present

## 2012-03-09 DIAGNOSIS — J438 Other emphysema: Secondary | ICD-10-CM

## 2012-03-09 DIAGNOSIS — R112 Nausea with vomiting, unspecified: Secondary | ICD-10-CM

## 2012-03-09 DIAGNOSIS — T50905A Adverse effect of unspecified drugs, medicaments and biological substances, initial encounter: Secondary | ICD-10-CM

## 2012-03-09 HISTORY — DX: Hyperglycemia, unspecified: R73.9

## 2012-03-09 HISTORY — DX: Adverse effect of unspecified drugs, medicaments and biological substances, initial encounter: T50.905A

## 2012-03-09 LAB — GLUCOSE, CAPILLARY
Glucose-Capillary: 115 mg/dL — ABNORMAL HIGH (ref 70–99)
Glucose-Capillary: 127 mg/dL — ABNORMAL HIGH (ref 70–99)
Glucose-Capillary: 146 mg/dL — ABNORMAL HIGH (ref 70–99)

## 2012-03-09 LAB — CBC
HCT: 28.2 % — ABNORMAL LOW (ref 36.0–46.0)
Hemoglobin: 9.4 g/dL — ABNORMAL LOW (ref 12.0–15.0)
MCH: 32.5 pg (ref 26.0–34.0)
MCHC: 33.3 g/dL (ref 30.0–36.0)

## 2012-03-09 LAB — BASIC METABOLIC PANEL
BUN: 37 mg/dL — ABNORMAL HIGH (ref 6–23)
CO2: 23 mEq/L (ref 19–32)
Chloride: 98 mEq/L (ref 96–112)
GFR calc Af Amer: 47 mL/min — ABNORMAL LOW (ref >90)
Glucose, Bld: 113 mg/dL — ABNORMAL HIGH (ref 70–99)
Potassium: 3.2 mEq/L — ABNORMAL LOW (ref 3.5–5.1)

## 2012-03-09 MED ORDER — IPRATROPIUM BROMIDE 0.02 % IN SOLN
0.5000 mg | Freq: Four times a day (QID) | RESPIRATORY_TRACT | Status: DC
  Administered 2012-03-09 – 2012-03-12 (×11): 0.5 mg via RESPIRATORY_TRACT
  Filled 2012-03-09 (×12): qty 2.5

## 2012-03-09 MED ORDER — POTASSIUM CHLORIDE CRYS ER 20 MEQ PO TBCR
40.0000 meq | EXTENDED_RELEASE_TABLET | Freq: Two times a day (BID) | ORAL | Status: DC
  Administered 2012-03-09 (×2): 40 meq via ORAL
  Filled 2012-03-09 (×2): qty 2

## 2012-03-09 MED ORDER — ALBUTEROL SULFATE (5 MG/ML) 0.5% IN NEBU
2.5000 mg | INHALATION_SOLUTION | Freq: Four times a day (QID) | RESPIRATORY_TRACT | Status: DC
  Administered 2012-03-09 – 2012-03-12 (×11): 2.5 mg via RESPIRATORY_TRACT
  Filled 2012-03-09 (×12): qty 0.5

## 2012-03-09 MED ORDER — MOXIFLOXACIN HCL 400 MG PO TABS
400.0000 mg | ORAL_TABLET | Freq: Every day | ORAL | Status: DC
  Administered 2012-03-09 – 2012-03-10 (×2): 400 mg via ORAL
  Filled 2012-03-09 (×2): qty 1

## 2012-03-09 MED ORDER — SODIUM CHLORIDE 0.9 % IJ SOLN
INTRAMUSCULAR | Status: AC
  Administered 2012-03-09: 10 mL
  Filled 2012-03-09: qty 3

## 2012-03-09 NOTE — Progress Notes (Signed)
Noticed open skin to right groin area where briefs cut into skin.  Patient was advised not to wear briefs.  Vaseline gauze was placed in the fold

## 2012-03-09 NOTE — Progress Notes (Signed)
Subjective: The patient is sitting up in bed. She had pelvic pain earlier, but it was relieved with treatment.  Objective: Vital signs in last 24 hours: Filed Vitals:   03/08/12 2053 03/08/12 2144 03/09/12 0434 03/09/12 1000  BP: 129/55 126/88 133/82 111/74  Pulse: 86 73 74 76  Temp:  97.9 F (36.6 C) 97.8 F (36.6 C) 98.1 F (36.7 C)  TempSrc:  Oral Oral Oral  Resp: 16 16 16 16   Height:  5\' 2"  (1.575 m)    Weight:  95.8 kg (211 lb 3.2 oz) 95.8 kg (211 lb 3.2 oz)   SpO2: 94% 86% 96% 100%    Intake/Output Summary (Last 24 hours) at 03/09/12 1209 Last data filed at 03/09/12 1150  Gross per 24 hour  Intake      0 ml  Output    200 ml  Net   -200 ml    Weight change:   Lungs: Few bilateral wheezes. Heart: S1, S2, with ectopy. Abdomen: Obese, positive bowel sounds, soft, nontender. Pelvis: Left greater than right tenderness. Extremities: Trace of pedal edema.  Lab Results: Basic Metabolic Panel:  Basename 03/09/12 0804 03/08/12 2200 03/08/12 1929  NA 132* -- 132*  K 3.2* -- 3.3*  CL 98 -- 97  CO2 23 -- 22  GLUCOSE 113* -- 230*  BUN 37* -- 47*  CREATININE 1.26* -- 1.50*  CALCIUM 8.6 -- 9.0  MG -- 2.4 --  PHOS -- -- --   Liver Function Tests: No results found for this basename: AST:2,ALT:2,ALKPHOS:2,BILITOT:2,PROT:2,ALBUMIN:2 in the last 72 hours No results found for this basename: LIPASE:2,AMYLASE:2 in the last 72 hours No results found for this basename: AMMONIA:2 in the last 72 hours CBC:  Basename 03/09/12 0804 03/08/12 1929  WBC 13.2* 18.3*  NEUTROABS -- 16.3*  HGB 9.4* 10.2*  HCT 28.2* 30.1*  MCV 97.6 98.4  PLT 120* 155   Cardiac Enzymes: No results found for this basename: CKTOTAL:3,CKMB:3,CKMBINDEX:3,TROPONINI:3 in the last 72 hours BNP: No results found for this basename: PROBNP:3 in the last 72 hours D-Dimer: No results found for this basename: DDIMER:2 in the last 72 hours CBG:  Basename 03/09/12 1147 03/09/12 0738 03/08/12 2334  GLUCAP  127* 115* 133*   Hemoglobin A1C: No results found for this basename: HGBA1C in the last 72 hours Fasting Lipid Panel: No results found for this basename: CHOL,HDL,LDLCALC,TRIG,CHOLHDL,LDLDIRECT in the last 72 hours Thyroid Function Tests: No results found for this basename: TSH,T4TOTAL,FREET4,T3FREE,THYROIDAB in the last 72 hours Anemia Panel: No results found for this basename: VITAMINB12,FOLATE,FERRITIN,TIBC,IRON,RETICCTPCT in the last 72 hours Coagulation:  Basename 03/08/12 1929  LABPROT 13.1  INR 0.97   Urine Drug Screen: Drugs of Abuse  No results found for this basename: labopia, cocainscrnur, labbenz, amphetmu, thcu, labbarb    Alcohol Level: No results found for this basename: ETH:2 in the last 72 hours Urinalysis: No results found for this basename: COLORURINE:2,APPERANCEUR:2,LABSPEC:2,PHURINE:2,GLUCOSEU:2,HGBUR:2,BILIRUBINUR:2,KETONESUR:2,PROTEINUR:2,UROBILINOGEN:2,NITRITE:2,LEUKOCYTESUR:2 in the last 72 hours Misc. Labs:   Micro: Recent Results (from the past 240 hour(s))  CULTURE, BLOOD (SINGLE)     Status: Normal   Collection Time   02/29/12  2:25 PM      Component Value Range Status Comment   Specimen Description BLOOD PORTA CATH DRAWN BY RN   Final    Special Requests BOTTLES DRAWN AEROBIC AND ANAEROBIC 8 CC EACH   Final    Culture NO GROWTH 5 DAYS   Final    Report Status 03/05/2012 FINAL   Final   URINE CULTURE  Status: Normal   Collection Time   02/29/12  2:51 PM      Component Value Range Status Comment   Specimen Description URINE, CLEAN CATCH   Final    Special Requests IMMUNE:COMPRM   Final    Culture  Setup Time [id number]   Final    Colony Count 10,000 COLONIES/ML   Final    Culture ESCHERICHIA COLI   Final    Report Status 03/03/2012 FINAL   Final    Organism ID, Bacteria ESCHERICHIA COLI   Final     Studies/Results: Dg Chest 1 View  03/08/2012  *RADIOLOGY REPORT*  Clinical Data: Shortness of breath, weakness.  History of fall today.   CHEST - 1 VIEW  Comparison: Chest x-ray 03/02/2012.  Findings: Lungs are well expanded bilaterally.  There are linear opacities in the left lower lobe, favored to represent areas of subsegmental atelectasis.  No definite consolidative airspace disease.  No definite pleural effusions.  Pulmonary vasculature is within normal limits.  Heart size is mildly enlarged.  Upper mediastinal contours appears slightly widened, however, this is likely accentuated by the lordotic positioning.  Atherosclerotic calcifications within the arch of the aorta.  A right subclavian single lumen Port-A-Cath is in position with tip terminating in the mid superior vena cava.  Compared to the prior examination, there appears to be a healing nondisplaced fracture of the mid left clavicle.  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  IMPRESSION: 1.  Small amount of subsegmental atelectasis in the left lower lobe.  No other radiographic evidence of acute cardiopulmonary disease. 2.  Atherosclerosis. 3.  Mild cardiomegaly with prominence of the left ventricular contour which may suggest underlying left ventricular hypertrophy. 4.  Healing fracture of the left mid clavicle, and the old fracture through the base of the acromion process with chronic nonunion, as above.  Original Report Authenticated By: Florencia Reasons, M.D.   Dg Hip Complete Left  03/08/2012  *RADIOLOGY REPORT*  Clinical Data: Hip pain status post fall today.  LEFT HIP - COMPLETE 2+ VIEW  Comparison: No priors.  Findings: There are acute minimally displaced fractures through the left superior and inferior pubic ramus, with probable extension into the parasymphyseal region of the pubis on the left.  No additional displaced pelvic fractures are otherwise noted.  The visualized proximal femurs are intact, and the left femoral head is properly located.  Mild degenerative changes of osteoarthritis are noted the hip joints  bilaterally.  IMPRESSION: 1.  Acute fracture of the left pubic bone involving the left superior and inferior pubic rami with probable extension into the left parasymphyseal region.  Original Report Authenticated By: Florencia Reasons, M.D.    Medications:  Scheduled:   . albuterol  2.5 mg Nebulization Once  . albuterol  2.5 mg Nebulization Q6H  . amLODipine  5 mg Oral Daily  . docusate sodium  100 mg Oral Q M,W,F  . enoxaparin  40 mg Subcutaneous Q24H  . folic acid  1 mg Oral Daily  . furosemide  40 mg Oral Daily  . guaiFENesin  1,200 mg Oral BID  . HYDROcodone-acetaminophen  2 tablet Oral Once  . insulin aspart  0-15 Units Subcutaneous TID WC  . insulin aspart  0-5 Units Subcutaneous QHS  . ipratropium  0.5 mg Nebulization Once  . ipratropium  0.5 mg Nebulization Q6H  . moxifloxacin  400 mg Oral q1800  . pantoprazole  40 mg  Oral Q1200  . potassium chloride SA  40 mEq Oral BID  . predniSONE  40 mg Oral Q breakfast   Followed by  . predniSONE  30 mg Oral Q breakfast   Followed by  . predniSONE  20 mg Oral Q breakfast   Followed by  . predniSONE  10 mg Oral Q breakfast  . sodium chloride      . sodium chloride      . spironolactone  50 mg Oral QHS  . DISCONTD: potassium chloride SA  20 mEq Oral Q M,W,F   Continuous:  WUJ:[id number], acetaminophen, albuterol, diazepam, HYDROmorphone (DILAUDID) injection, ondansetron (ZOFRAN) IV, ondansetron, oxyCODONE, traZODone  Assessment: Principal Problem:  *Fracture Of Multiple Pubic Rami Active Problems:  Essential hypertension, benign  Obesity  Multiple myeloma  Gastroesophageal reflux disease  Pernicious anemia  Chronic kidney disease, stage 2, mildly decreased GFR  Peripheral neuropathy  Chronic low back pain  Anemia of chronic disease  Acute bronchitis  Physical deconditioning  Hypokalemia  Hyperglycemia, drug-induced    1. Status post fall, with multiple pubic rami fractures, greater on the left. The patient will  need pain management and physical therapy with rehabilitation.  Acute bronchitis/COPD. Steroid therapy continued from previous hospitalization. Given that she has been rehospitalized, will start empiric antibiotic with Avelox.  Hypokalemia. Her magnesium level is within normal limits. It is likely, that her hypokalemia secondary to Lasix.  Hyperglycemia, presumed to be steroid dependent. She is on sliding scale NovoLog. Hemoglobin A1c is pending.  Multiple myeloma. She is followed by oncologist Dr. Jodene Nam.  Plan:  1. Physical therapy and occupational therapy consults pending. 2. Supplement potassium chloride. 3. Consult oncology. 4. Continue sliding scale NovoLog. We'll check the results of the hemoglobin A1c.    LOS: 1 day   Granvil Djordjevic 03/09/2012, 12:09 PM

## 2012-03-09 NOTE — Progress Notes (Signed)
Clinical Social Work Department BRIEF PSYCHOSOCIAL ASSESSMENT 03/09/2012  Patient:  [name], [name]     Account Number:  [id number]     Admit date:  03/08/2012  Clinical Social Worker:  Thomasene Mohair  Date/Time:  03/09/2012 11:45 AM  Referred by:  Physician  Date Referred:  03/09/2012 Referred for  SNF Placement   Other Referral:   Interview type:  Patient Other interview type:   son @ bedside    PSYCHOSOCIAL DATA Living Status:  FAMILY Admitted from facility:   Level of care:   Primary support name:  Dorene Sorrow Felkins/son/ [phone] Primary support relationship to patient:  CHILD, ADULT Degree of support available:   adequate support by family. Pt lives with granddaughter.    CURRENT CONCERNS Current Concerns  Post-Acute Placement  Adjustment to Illness   Other Concerns:    SOCIAL WORK ASSESSMENT / PLAN Clinical Socia Worker was referred to Pt to assist with dc planning. Pt was recently hospitalized, went home and fell while using walker. Pt lives with her granddaughter (adult). PT/OT evaluations are pending, but Pt is likely going to need st-snf at DC. Pt has been to SNF before and did well with therapy. CSW will begin the placement process as we wait for evaluations. CSW will also need to tx plan from oncology for SNF to review before they offer bed availability.   Assessment/plan status:   Other assessment/ plan:   start SNF bed search   Information/referral to community resources:    PATIENT'S/FAMILY'S RESPONSE TO PLAN OF CARE: CSW met with Pt while Pt's son was at bedside. Both Pt and her son are in agreement with SNF if recommended by MD. Pt has been to SNF in the past and believed it to be helpful.   Frederico Hamman, LCSW (covering) [phone]

## 2012-03-10 DIAGNOSIS — C9000 Multiple myeloma not having achieved remission: Secondary | ICD-10-CM

## 2012-03-10 DIAGNOSIS — J438 Other emphysema: Secondary | ICD-10-CM

## 2012-03-10 DIAGNOSIS — M899 Disorder of bone, unspecified: Secondary | ICD-10-CM

## 2012-03-10 DIAGNOSIS — E871 Hypo-osmolality and hyponatremia: Secondary | ICD-10-CM

## 2012-03-10 DIAGNOSIS — D696 Thrombocytopenia, unspecified: Secondary | ICD-10-CM

## 2012-03-10 DIAGNOSIS — N289 Disorder of kidney and ureter, unspecified: Secondary | ICD-10-CM

## 2012-03-10 DIAGNOSIS — S32509A Unspecified fracture of unspecified pubis, initial encounter for closed fracture: Secondary | ICD-10-CM

## 2012-03-10 DIAGNOSIS — M549 Dorsalgia, unspecified: Secondary | ICD-10-CM

## 2012-03-10 DIAGNOSIS — M311 Thrombotic microangiopathy, unspecified: Secondary | ICD-10-CM

## 2012-03-10 DIAGNOSIS — R001 Bradycardia, unspecified: Secondary | ICD-10-CM | POA: Diagnosis present

## 2012-03-10 HISTORY — DX: Thrombocytopenia, unspecified: D69.6

## 2012-03-10 LAB — TSH: TSH: 0.08 u[IU]/mL — ABNORMAL LOW (ref 0.350–4.500)

## 2012-03-10 LAB — GLUCOSE, CAPILLARY
Glucose-Capillary: 117 mg/dL — ABNORMAL HIGH (ref 70–99)
Glucose-Capillary: 213 mg/dL — ABNORMAL HIGH (ref 70–99)
Glucose-Capillary: 128 mg/dL — ABNORMAL HIGH (ref 70–99)

## 2012-03-10 LAB — CBC
Hemoglobin: 8.8 g/dL — ABNORMAL LOW (ref 12.0–15.0)
MCH: 32.6 pg (ref 26.0–34.0)
MCHC: 33 g/dL (ref 30.0–36.0)

## 2012-03-10 LAB — BASIC METABOLIC PANEL
BUN: 43 mg/dL — ABNORMAL HIGH (ref 6–23)
GFR calc non Af Amer: 33 mL/min — ABNORMAL LOW (ref >90)
Glucose, Bld: 129 mg/dL — ABNORMAL HIGH (ref 70–99)
Potassium: 4.5 mEq/L (ref 3.5–5.1)

## 2012-03-10 LAB — HEMOGLOBIN A1C: Mean Plasma Glucose: 134 mg/dL — ABNORMAL HIGH (ref <117)

## 2012-03-10 NOTE — Progress Notes (Signed)
Pt hr runs slow and crosses in to high 40's at rest 48-49.

## 2012-03-10 NOTE — Progress Notes (Signed)
Subjective: The patient is sitting up in the chair. She was assisted by the physical therapist. She has no new complaints.  Objective: Vital signs in last 24 hours: Filed Vitals:   03/10/12 0332 03/10/12 0435 03/10/12 0947 03/10/12 1338  BP:  111/65 113/74   Pulse:  84 87   Temp:  97.8 F (36.6 C) 98.2 F (36.8 C)   TempSrc:  Oral    Resp:  17 17   Height:      Weight:  97.8 kg (215 lb 9.8 oz)    SpO2: 95% 99% 94% 93%    Intake/Output Summary (Last 24 hours) at 03/10/12 1401 Last data filed at 03/10/12 1300  Gross per 24 hour  Intake    480 ml  Output    575 ml  Net    -95 ml    Weight change: 2.545 kg (5 lb 9.8 oz)  Lungs: Few bilateral wheezes. Heart: S1, S2, with ectopy. Abdomen: Obese, positive bowel sounds, soft, nontender. Pelvis: Left greater than right tenderness. Extremities: Trace of pedal edema.  Lab Results: Basic Metabolic Panel:  Basename 03/10/12 0501 03/09/12 0804 03/08/12 2200  NA 135 132* --  K 4.5 3.2* --  CL 103 98 --  CO2 23 23 --  GLUCOSE 129* 113* --  BUN 43* 37* --  CREATININE 1.48* 1.26* --  CALCIUM 9.3 8.6 --  MG -- -- 2.4  PHOS -- -- --   Liver Function Tests: No results found for this basename: AST:2,ALT:2,ALKPHOS:2,BILITOT:2,PROT:2,ALBUMIN:2 in the last 72 hours No results found for this basename: LIPASE:2,AMYLASE:2 in the last 72 hours No results found for this basename: AMMONIA:2 in the last 72 hours CBC:  Basename 03/10/12 0501 03/09/12 0804 03/08/12 1929  WBC 12.5* 13.2* --  NEUTROABS -- -- 16.3*  HGB 8.8* 9.4* --  HCT 26.7* 28.2* --  MCV 98.9 97.6 --  PLT 108* 120* --   Cardiac Enzymes: No results found for this basename: CKTOTAL:3,CKMB:3,CKMBINDEX:3,TROPONINI:3 in the last 72 hours BNP: No results found for this basename: PROBNP:3 in the last 72 hours D-Dimer: No results found for this basename: DDIMER:2 in the last 72 hours CBG:  Basename 03/10/12 1118 03/10/12 0730 03/09/12 2014 03/09/12 1641 03/09/12 1147  03/09/12 0738  GLUCAP 117* 104* 146* 190* 127* 115*   Hemoglobin A1C:  Basename 03/08/12 2241  HGBA1C 6.3*   Fasting Lipid Panel: No results found for this basename: CHOL,HDL,LDLCALC,TRIG,CHOLHDL,LDLDIRECT in the last 72 hours Thyroid Function Tests: No results found for this basename: TSH,T4TOTAL,FREET4,T3FREE,THYROIDAB in the last 72 hours Anemia Panel: No results found for this basename: VITAMINB12,FOLATE,FERRITIN,TIBC,IRON,RETICCTPCT in the last 72 hours Coagulation:  Basename 03/08/12 1929  LABPROT 13.1  INR 0.97   Urine Drug Screen: Drugs of Abuse  No results found for this basename: labopia,  cocainscrnur,  labbenz,  amphetmu,  thcu,  labbarb    Alcohol Level: No results found for this basename: ETH:2 in the last 72 hours Urinalysis: No results found for this basename: COLORURINE:2,APPERANCEUR:2,LABSPEC:2,PHURINE:2,GLUCOSEU:2,HGBUR:2,BILIRUBINUR:2,KETONESUR:2,PROTEINUR:2,UROBILINOGEN:2,NITRITE:2,LEUKOCYTESUR:2 in the last 72 hours Misc. Labs:   Micro: Recent Results (from the past 240 hour(s))  CULTURE, BLOOD (SINGLE)     Status: Normal   Collection Time   02/29/12  2:25 PM      Component Value Range Status Comment   Specimen Description BLOOD PORTA CATH DRAWN BY RN   Final    Special Requests BOTTLES DRAWN AEROBIC AND ANAEROBIC 8 CC EACH   Final    Culture NO GROWTH 5 DAYS   Final  Report Status 03/05/2012 FINAL   Final   URINE CULTURE     Status: Normal   Collection Time   02/29/12  2:51 PM      Component Value Range Status Comment   Specimen Description URINE, CLEAN CATCH   Final    Special Requests IMMUNE:COMPRM   Final    Culture  Setup Time [id number]   Final    Colony Count 10,000 COLONIES/ML   Final    Culture ESCHERICHIA COLI   Final    Report Status 03/03/2012 FINAL   Final    Organism ID, Bacteria ESCHERICHIA COLI   Final     Studies/Results: Dg Chest 1 View  03/08/2012  *RADIOLOGY REPORT*  Clinical Data: Shortness of breath, weakness.   History of fall today.  CHEST - 1 VIEW  Comparison: Chest x-ray 03/02/2012.  Findings: Lungs are well expanded bilaterally.  There are linear opacities in the left lower lobe, favored to represent areas of subsegmental atelectasis.  No definite consolidative airspace disease.  No definite pleural effusions.  Pulmonary vasculature is within normal limits.  Heart size is mildly enlarged.  Upper mediastinal contours appears slightly widened, however, this is likely accentuated by the lordotic positioning.  Atherosclerotic calcifications within the arch of the aorta.  A right subclavian single lumen Port-A-Cath is in position with tip terminating in the mid superior vena cava.  Compared to the prior examination, there appears to be a healing nondisplaced fracture of the mid left clavicle.  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  IMPRESSION: 1.  Small amount of subsegmental atelectasis in the left lower lobe.  No other radiographic evidence of acute cardiopulmonary disease. 2.  Atherosclerosis. 3.  Mild cardiomegaly with prominence of the left ventricular contour which may suggest underlying left ventricular hypertrophy. 4.  Healing fracture of the left mid clavicle, and the old fracture through the base of the acromion process with chronic nonunion, as above.  Original Report Authenticated By: Florencia Reasons, M.D.   Dg Hip Complete Left  03/08/2012  *RADIOLOGY REPORT*  Clinical Data: Hip pain status post fall today.  LEFT HIP - COMPLETE 2+ VIEW  Comparison: No priors.  Findings: There are acute minimally displaced fractures through the left superior and inferior pubic ramus, with probable extension into the parasymphyseal region of the pubis on the left.  No additional displaced pelvic fractures are otherwise noted.  The visualized proximal femurs are intact, and the left femoral head is properly located.  Mild degenerative changes of osteoarthritis are  noted the hip joints bilaterally.  IMPRESSION: 1.  Acute fracture of the left pubic bone involving the left superior and inferior pubic rami with probable extension into the left parasymphyseal region.  Original Report Authenticated By: Florencia Reasons, M.D.    Medications:  Scheduled:    . albuterol  2.5 mg Nebulization Q6H  . amLODipine  5 mg Oral Daily  . docusate sodium  100 mg Oral Q M,W,F  . enoxaparin  40 mg Subcutaneous Q24H  . folic acid  1 mg Oral Daily  . furosemide  40 mg Oral Daily  . guaiFENesin  1,200 mg Oral BID  . insulin aspart  0-15 Units Subcutaneous TID WC  . insulin aspart  0-5 Units Subcutaneous QHS  . ipratropium  0.5 mg Nebulization Q6H  . moxifloxacin  400 mg Oral q1800  . pantoprazole  40 mg Oral Q1200  . predniSONE  30 mg  Oral Q breakfast   Followed by  . predniSONE  20 mg Oral Q breakfast   Followed by  . predniSONE  10 mg Oral Q breakfast  . spironolactone  50 mg Oral QHS  . DISCONTD: potassium chloride SA  40 mEq Oral BID   Continuous:  ZOX:[id number], acetaminophen, albuterol, diazepam, HYDROmorphone (DILAUDID) injection, ondansetron (ZOFRAN) IV, ondansetron, oxyCODONE, traZODone  Assessment: Principal Problem:  *Fracture Of Multiple Pubic Rami Active Problems:  Essential hypertension, benign  Obesity  Multiple myeloma  Gastroesophageal reflux disease  Pernicious anemia  Chronic kidney disease, stage 2, mildly decreased GFR  Peripheral neuropathy  Chronic low back pain  Anemia of chronic disease  Acute bronchitis  Physical deconditioning  Hypokalemia  Hyperglycemia, drug-induced  Thrombocytopenia  Bradycardia    1. Status post fall, with multiple pubic rami fractures, greater on the left. And is and and an in and and The patient will need pain management and physical therapy with rehabilitation. Physical therapy consultation noted and appreciated. Skilled nursing facility placement will be pursued. The patient is in  agreement.  Acute bronchitis/COPD. Steroid therapy continued from previous hospitalization. She is also on empiric antibiotic with Avelox.  Hypokalemia. Repleted. Her magnesium level is within normal limits.  Hyperglycemia, presumed to be steroid dependent. She is on sliding scale NovoLog. Hemoglobin A1c is 6.3.  Multiple myeloma. She is followed by oncologist Dr. Jodene Nam. Mr. Hiram Comber noted.  Anemia, secondary to multiple myeloma.  Thrombocytopenia, question etiology.  Plan:  1. Surgical intervention is not indicated with pelvic fractures, therefore, orthopedic surgery will not be consulted. 2. Will order TSH, free T4, and vitamin B12 to evaluate thrombocytopenia. We'll ask the oncology team to weigh in on anemia and thrombocytopenia. 3. Will pursue skilled nursing facility placement as deemed necessary.    LOS: 2 days   Darika Ildefonso 03/10/2012, 2:01 PM

## 2012-03-10 NOTE — Evaluation (Signed)
Physical Therapy Evaluation Patient Details Name: [name] MRN: [id number] DOB: Oct 31, 1932 Today's Date: 03/10/2012 Time: 4098-1191 PT Time Calculation (min): 49 min  PT Assessment / Plan / Recommendation Clinical Impression  Pt  is now seen with L pelvic fx.  Pain moderately controlled.  She is alert and oriented.  She had been deconditioned PTA, now is only able to transfer bed to chair with walker and mod assist.  She is unable to tolerate much weight on LLE due to pain.  She will need SNF at d/c.    PT Assessment  Patient needs continued PT services    Follow Up Recommendations  Skilled nursing facility    Barriers to Discharge None      lEquipment Recommendations  Defer to next venue    Recommendations for Other Services     Frequency Min 5X/week    Precautions / Restrictions Precautions Precautions: Fall Restrictions Weight Bearing Restrictions: No   Pertinent Vitals/Pain       Mobility  Bed Mobility Bed Mobility: Supine to Sit;Sit to Supine Supine to Sit: 2: Max assist;HOB elevated Sit to Supine: Not Tested (comment) Transfers Transfers: Sit to Stand;Stand to Sit Sit to Stand: 3: Mod assist;From elevated surface;With upper extremity assist Stand to Sit: 3: Mod assist;With upper extremity assist Stand Pivot Transfers: 3: Mod assist;From elevated surface (using a walker) Details for Transfer Assistance: has increased pain with any wright bearing on LLE-walking is going to be very difficult  for this pt Ambulation/Gait Ambulation/Gait Assistance: Not tested (comment) (unable) Wheelchair Mobility Wheelchair Mobility: No    Exercises General Exercises - Lower Extremity Ankle Circles/Pumps: AROM;Both;10 reps;Supine Quad Sets: AROM;Both;10 reps;Supine Gluteal Sets: AROM;Both;10 reps;Supine Short Arc Quad: AROM;Both;10 reps;Supine Heel Slides: AAROM;Both;5 reps;Supine Hip ABduction/ADduction: AAROM;Both;5 reps;Supine   PT Diagnosis: Difficulty  walking;Generalized weakness;Acute pain  PT Problem List: Decreased strength;Decreased range of motion;Decreased activity tolerance;Decreased mobility;Decreased coordination;Decreased knowledge of precautions;Obesity;Pain PT Treatment Interventions: DME instruction;Gait training;Functional mobility training;Therapeutic exercise;Therapeutic activities;Patient/family education   PT Goals Acute Rehab PT Goals PT Goal Formulation: With patient/family Time For Goal Achievement: 03/17/12 Potential to Achieve Goals: Fair Pt will go Supine/Side to Sit: with mod assist;with HOB not 0 degrees (comment degree) PT Goal: Supine/Side to Sit - Progress: Goal set today Pt will go Sit to Supine/Side: with mod assist;with HOB not 0 degrees (comment degree) PT Goal: Sit to Supine/Side - Progress: Goal set today Pt will go Sit to Stand: with min assist;from elevated surface;with upper extremity assist PT Goal: Sit to Stand - Progress: Goal set today Pt will go Stand to Sit: with min assist;with upper extremity assist PT Goal: Stand to Sit - Progress: Goal set today Pt will Transfer Bed to Chair/Chair to Bed: with min assist PT Transfer Goal: Bed to Chair/Chair to Bed - Progress: Goal set today Pt will Ambulate: 1 - 15 feet;with max assist;with rolling walker PT Goal: Ambulate - Progress: Goal set today  Visit Information  Last PT Received On: 03/10/12    Subjective Data  Subjective: no specific c/o Patient Stated Goal: none stated   Prior Functioning       Cognition  Overall Cognitive Status: Appears within functional limits for tasks assessed/performed Arousal/Alertness: Awake/alert Orientation Level: Appears intact for tasks assessed Behavior During Session: [facility] for tasks performed    Extremity/Trunk Assessment Right Lower Extremity Assessment RLE ROM/Strength/Tone: [facility] for tasks assessed RLE Sensation: WFL - Light Touch;WFL - Proprioception Left Lower Extremity Assessment LLE  ROM/Strength/Tone: Deficits;Unable to fully assess;Due to pain LLE ROM/Strength/Tone  Deficits: has pain with any motion of LLE LLE Sensation: WFL - Light Touch Trunk Assessment Trunk Assessment: Kyphotic   Balance Balance Balance Assessed: No  End of Session PT - End of Session Equipment Utilized During Treatment: Gait belt Activity Tolerance: Patient limited by fatigue;Patient limited by pain Patient left: in chair;with call bell/phone within reach;with family/visitor present Nurse Communication: Mobility status   Konrad Penta 03/10/2012, 10:45 AM

## 2012-03-10 NOTE — Progress Notes (Signed)
UR Chart Review Completed  

## 2012-03-10 NOTE — Clinical Social Work Note (Signed)
CSW faxed out FL2 and will provide bed offers when available.  Possible d/c tomorrow per MD.  Pt aware of copays.  Karn Cassis

## 2012-03-10 NOTE — Clinical Social Work Placement (Signed)
Clinical Social Work Department CLINICAL SOCIAL WORK PLACEMENT NOTE 03/10/2012  Patient:  [name], [name]  Account Number:  [id number] Admit date:  03/08/2012  Clinical Social Worker:  Sherrlyn Hock  Date/time:  03/10/2012 02:30 PM  Clinical Social Work is seeking post-discharge placement for this patient at the following level of care:   SKILLED NURSING   (*CSW will update this form in Epic as items are completed)   03/10/2012  Patient/family provided with [facility] Department of Clinical Social Work's list of facilities offering this level of care within the geographic area requested by the patient (or if unable, by the patient's family).  03/10/2012  Patient/family informed of their freedom to choose among providers that offer the needed level of care, that participate in Medicare, Medicaid or managed care program needed by the patient, have an available bed and are willing to accept the patient.  03/10/2012  Patient/family informed of [facility]' ownership interest in [facility], as well as of the fact that they are under no obligation to receive care at this facility.  PASARR submitted to EDS on  PASARR number received from EDS on   FL2 transmitted to all facilities in geographic area requested by pt/family on  03/10/2012 FL2 transmitted to all facilities within larger geographic area on   Patient informed that his/her managed care company has contracts with or will negotiate with  certain facilities, including the following:     Patient/family informed of bed offers received:   Patient chooses bed at  Physician recommends and patient chooses bed at    Patient to be transferred to  on   Patient to be transferred to facility by   The following physician request were entered in Epic:   Additional Comments: Previous pasarr number.  Karn Cassis

## 2012-03-10 NOTE — Consult Note (Signed)
[facility] Consultation Oncology  Name: [name]      MRN: [id number]    Location: W098/J191-47  Date: 03/10/2012 Time:9:22 AM   REFERRING PHYSICIAN:  Elliot Cousin, MD  REASON FOR CONSULT:  Pathologic fracture   DIAGNOSIS:  Multiple Myeloma  HISTORY OF PRESENT ILLNESS:   This is a 78 year old Caucasian female who is well known to the [facility] who is status post 2 cycles of Doxil every 28 days for her recent progression of multiple myeloma. Her multiple myeloma is poorly differentiated myeloma with a minimal IgA lambda M spike.    She presented with a right scalp mass November 2007 and a very faint M spike. She did have mild Bence-Jones proteinuria and with therapy, both the scalp mass in the Bence-Jones proteinuria disappeared. She had a relapse last year it was treated with 3 doses of Doxil although the drug was in short supply at that point time. She did not get full doses as a result. She was also given one cycle of epirubicin and Cytoxan since the drug became completely unavailable. Until most recently, the patient has not been treated since last years therapy (12/11/2010).  Recently, the patient's laboratory work revealed a small M spike at 100 mg/dL, thereby indicating recurrence of disease. It is still a IgA lambda protein.  As mentioned above, the patient is now status post 2 cycles of Doxil which was administered every 28 days.  Last week, the patient was admitted to the hospital with shortness of breath and potential COPD/emphysema exacerbation. She was treated effectively and return back to her baseline. She was discharged in stable condition. She now read present to the emergency Department following a fall. She reports that she attempted is about a chair and unfortunately missed the chair. She landed on her left side and her head struck the ground which caused a skin tear of the left face and left upper arm. She was noted to have severe left hip pain and  left upper leg pain with exacerbation with ambulation. Therefore she will present to the emergency Department for further evaluation. In the emergency department, a plain x-ray of the left hip reveals acute fracture of the left pubic bone involving the left superior and inferior pubic rami with probable extension into the left parasymphyseal region.  Thus, the patient was admitted for a hip fracture for further evaluation and treatment.  Kella reveals that her pain is pretty well controlled. She explains that she has left upper leg discomfort. She denies any groin pain. She denies any blood. She is noted to have a clean dressing on her left upper extremity and left side of face.   Otherwise, the patient denies any hematologic complaints.  PAST MEDICAL HISTORY:   Past Medical History  Diagnosis Date  . Essential hypertension, benign     LVH; RVH; normal EF in 04/2011 with the basilar inferior wall akinesis  . Edema   . Obesity   . Osteoporosis     Lumbar and thoracic compression fractures  . Multiple myeloma 2007    IgA lambda  . Gastroesophageal reflux disease   . Pernicious anemia     Plus thrombocytopenia;H./H.-9.8/29 in 12/2010  . Chronic kidney disease, stage 2, mildly decreased GFR     Creatinine of 1.3 in 09/2010; acute renal failure in 11/2010 secondary to diuretics with hyperkalemia secondary to Spiriva lactone  . Peripheral neuropathy     Complicating chemotherapy  . Chronic low back pain   .  Chronic steroid use   . History of candidiasis   . Allergy     Revlimid  . Anemia of chronic disease   . B12 deficiency   . COPD (chronic obstructive pulmonary disease)   . Hyperglycemia, drug-induced 03/09/2012    ALLERGIES: Allergies  Allergen Reactions  . Lenalidomide Rash      MEDICATIONS: I have reviewed the patient's current medications.     PAST SURGICAL HISTORY Past Surgical History  Procedure Date  . Total abdominal hysterectomy w/ bilateral salpingoophorectomy      Incidental appendectomy  . Rotator cuff repair     Left  . Vein surgery     Ligation and stripping bilaterally  . Orif ankle fracture     Right  . Carpal tunnel release     Right  . Central venous catheter tunneled insertion single lumen 2012    FAMILY HISTORY: Family History  Problem Relation Age of Onset  . Heart attack Brother     SOCIAL HISTORY:  reports that she has never smoked. She has never used smokeless tobacco. She reports that she does not drink alcohol or use illicit drugs.  PERFORMANCE STATUS: The patient's performance status is 3 - Symptomatic, >50% confined to bed  PHYSICAL EXAM: Most Recent Vital Signs: Blood pressure 111/65, pulse 84, temperature 97.8 F (36.6 C), temperature source Oral, resp. rate 17, height 5\' 2"  (1.575 m), weight 215 lb 9.8 oz (97.8 kg), SpO2 99.00%. General appearance: alert, cooperative, appears stated age, no distress and moderately obese Head: Normocephalic, without obvious abnormality, atraumatic Neck: supple, symmetrical, trachea midline Lungs: clear to auscultation bilaterally anteriorly Heart: regular rate and rhythm, S1, S2 normal, no murmur, click, rub or gallop Abdomen: soft, non-tender; bowel sounds normal; no masses,  no organomegaly Extremities: extremities normal, atraumatic, no cyanosis or edema and edema 1+ LE pitting edema, at baseline Skin: Skin color, texture, turgor normal. No rashes or lesions Neurologic: Grossly normal  LABORATORY DATA:  Results for orders placed during the hospital encounter of 03/08/12 (from the past 48 hour(s))  CBC     Status: Abnormal   Collection Time   03/08/12  7:29 PM      Component Value Range Comment   WBC 18.3 (*) 4.0 - 10.5 (K/uL)    RBC 3.06 (*) 3.87 - 5.11 (MIL/uL)    Hemoglobin 10.2 (*) 12.0 - 15.0 (g/dL)    HCT 16.1 (*) 09.6 - 46.0 (%)    MCV 98.4  78.0 - 100.0 (fL)    MCH 33.3  26.0 - 34.0 (pg)    MCHC 33.9  30.0 - 36.0 (g/dL)    RDW 04.5 (*) 40.9 - 15.5 (%)    Platelets  155  150 - 400 (K/uL)   DIFFERENTIAL     Status: Abnormal   Collection Time   03/08/12  7:29 PM      Component Value Range Comment   Neutrophils Relative 89 (*) 43 - 77 (%)    Lymphocytes Relative 2 (*) 12 - 46 (%)    Monocytes Relative 9  3 - 12 (%)    Eosinophils Relative 0  0 - 5 (%)    Basophils Relative 0  0 - 1 (%)    Band Neutrophils 0  0 - 10 (%)    Metamyelocytes Relative 0      Myelocytes 0      Promyelocytes Absolute 0      Blasts 0      nRBC 0  0 (/100 WBC)  Neutro Abs 16.3 (*) 1.7 - 7.7 (K/uL)    Lymphs Abs 0.4 (*) 0.7 - 4.0 (K/uL)    Monocytes Absolute 1.6 (*) 0.1 - 1.0 (K/uL)    Eosinophils Absolute 0.0  0.0 - 0.7 (K/uL)    Basophils Absolute 0.0  0.0 - 0.1 (K/uL)   BASIC METABOLIC PANEL     Status: Abnormal   Collection Time   03/08/12  7:29 PM      Component Value Range Comment   Sodium 132 (*) 135 - 145 (mEq/L)    Potassium 3.3 (*) 3.5 - 5.1 (mEq/L)    Chloride 97  96 - 112 (mEq/L)    CO2 22  19 - 32 (mEq/L)    Glucose, Bld 230 (*) 70 - 99 (mg/dL)    BUN 47 (*) 6 - 23 (mg/dL)    Creatinine, Ser 1.61 (*) 0.50 - 1.10 (mg/dL)    Calcium 9.0  8.4 - 10.5 (mg/dL)    GFR calc non Af Amer 33 (*) >90 (mL/min)    GFR calc Af Amer 38 (*) >90 (mL/min)   PROTIME-INR     Status: Normal   Collection Time   03/08/12  7:29 PM      Component Value Range Comment   Prothrombin Time 13.1  11.6 - 15.2 (seconds)    INR 0.97  0.00 - 1.49    MAGNESIUM     Status: Normal   Collection Time   03/08/12 10:00 PM      Component Value Range Comment   Magnesium 2.4  1.5 - 2.5 (mg/dL)   HEMOGLOBIN W9U     Status: Abnormal   Collection Time   03/08/12 10:41 PM      Component Value Range Comment   Hemoglobin A1C 6.3 (*) <5.7 (%)    Mean Plasma Glucose 134 (*) <117 (mg/dL)   GLUCOSE, CAPILLARY     Status: Abnormal   Collection Time   03/08/12 11:34 PM      Component Value Range Comment   Glucose-Capillary 133 (*) 70 - 99 (mg/dL)   GLUCOSE, CAPILLARY     Status: Abnormal    Collection Time   03/09/12  7:38 AM      Component Value Range Comment   Glucose-Capillary 115 (*) 70 - 99 (mg/dL)   BASIC METABOLIC PANEL     Status: Abnormal   Collection Time   03/09/12  8:04 AM      Component Value Range Comment   Sodium 132 (*) 135 - 145 (mEq/L)    Potassium 3.2 (*) 3.5 - 5.1 (mEq/L)    Chloride 98  96 - 112 (mEq/L)    CO2 23  19 - 32 (mEq/L)    Glucose, Bld 113 (*) 70 - 99 (mg/dL)    BUN 37 (*) 6 - 23 (mg/dL)    Creatinine, Ser 0.45 (*) 0.50 - 1.10 (mg/dL)    Calcium 8.6  8.4 - 10.5 (mg/dL)    GFR calc non Af Amer 40 (*) >90 (mL/min)    GFR calc Af Amer 47 (*) >90 (mL/min)   CBC     Status: Abnormal   Collection Time   03/09/12  8:04 AM      Component Value Range Comment   WBC 13.2 (*) 4.0 - 10.5 (K/uL)    RBC 2.89 (*) 3.87 - 5.11 (MIL/uL)    Hemoglobin 9.4 (*) 12.0 - 15.0 (g/dL)    HCT 40.9 (*) 81.1 - 46.0 (%)    MCV 97.6  78.0 -  100.0 (fL)    MCH 32.5  26.0 - 34.0 (pg)    MCHC 33.3  30.0 - 36.0 (g/dL)    RDW 16.1  09.6 - 04.5 (%)    Platelets 120 (*) 150 - 400 (K/uL)   GLUCOSE, CAPILLARY     Status: Abnormal   Collection Time   03/09/12 11:47 AM      Component Value Range Comment   Glucose-Capillary 127 (*) 70 - 99 (mg/dL)   GLUCOSE, CAPILLARY     Status: Abnormal   Collection Time   03/09/12  4:41 PM      Component Value Range Comment   Glucose-Capillary 190 (*) 70 - 99 (mg/dL)    Comment 1 Notify RN     GLUCOSE, CAPILLARY     Status: Abnormal   Collection Time   03/09/12  8:14 PM      Component Value Range Comment   Glucose-Capillary 146 (*) 70 - 99 (mg/dL)   BASIC METABOLIC PANEL     Status: Abnormal   Collection Time   03/10/12  5:01 AM      Component Value Range Comment   Sodium 135  135 - 145 (mEq/L)    Potassium 4.5  3.5 - 5.1 (mEq/L) DELTA CHECK NOTED   Chloride 103  96 - 112 (mEq/L)    CO2 23  19 - 32 (mEq/L)    Glucose, Bld 129 (*) 70 - 99 (mg/dL)    BUN 43 (*) 6 - 23 (mg/dL)    Creatinine, Ser 4.09 (*) 0.50 - 1.10 (mg/dL)     Calcium 9.3  8.4 - 10.5 (mg/dL)    GFR calc non Af Amer 33 (*) >90 (mL/min)    GFR calc Af Amer 38 (*) >90 (mL/min)   CBC     Status: Abnormal   Collection Time   03/10/12  5:01 AM      Component Value Range Comment   WBC 12.5 (*) 4.0 - 10.5 (K/uL)    RBC 2.70 (*) 3.87 - 5.11 (MIL/uL)    Hemoglobin 8.8 (*) 12.0 - 15.0 (g/dL)    HCT 81.1 (*) 91.4 - 46.0 (%)    MCV 98.9  78.0 - 100.0 (fL)    MCH 32.6  26.0 - 34.0 (pg)    MCHC 33.0  30.0 - 36.0 (g/dL)    RDW 78.2 (*) 95.6 - 15.5 (%)    Platelets 108 (*) 150 - 400 (K/uL)   GLUCOSE, CAPILLARY     Status: Abnormal   Collection Time   03/10/12  7:30 AM      Component Value Range Comment   Glucose-Capillary 104 (*) 70 - 99 (mg/dL)    Comment 1 Notify RN         RADIOGRAPHY: Dg Chest 1 View  03/08/2012  *RADIOLOGY REPORT*  Clinical Data: Shortness of breath, weakness.  History of fall today.  CHEST - 1 VIEW  Comparison: Chest x-ray 03/02/2012.  Findings: Lungs are well expanded bilaterally.  There are linear opacities in the left lower lobe, favored to represent areas of subsegmental atelectasis.  No definite consolidative airspace disease.  No definite pleural effusions.  Pulmonary vasculature is within normal limits.  Heart size is mildly enlarged.  Upper mediastinal contours appears slightly widened, however, this is likely accentuated by the lordotic positioning.  Atherosclerotic calcifications within the arch of the aorta.  A right subclavian single lumen Port-A-Cath is in position with tip terminating in the mid superior vena cava.  Compared to the prior  examination, there appears to be a healing nondisplaced fracture of the mid left clavicle.  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  IMPRESSION: 1.  Small amount of subsegmental atelectasis in the left lower lobe.  No other radiographic evidence of acute cardiopulmonary disease. 2.  Atherosclerosis. 3.  Mild cardiomegaly with prominence of  the left ventricular contour which may suggest underlying left ventricular hypertrophy. 4.  Healing fracture of the left mid clavicle, and the old fracture through the base of the acromion process with chronic nonunion, as above.  Original Report Authenticated By: Florencia Reasons, M.D.   Dg Hip Complete Left  03/08/2012  *RADIOLOGY REPORT*  Clinical Data: Hip pain status post fall today.  LEFT HIP - COMPLETE 2+ VIEW  Comparison: No priors.  Findings: There are acute minimally displaced fractures through the left superior and inferior pubic ramus, with probable extension into the parasymphyseal region of the pubis on the left.  No additional displaced pelvic fractures are otherwise noted.  The visualized proximal femurs are intact, and the left femoral head is properly located.  Mild degenerative changes of osteoarthritis are noted the hip joints bilaterally.  IMPRESSION: 1.  Acute fracture of the left pubic bone involving the left superior and inferior pubic rami with probable extension into the left parasymphyseal region.  Original Report Authenticated By: Florencia Reasons, M.D.       ASSESSMENT:  1. Acute fracture of the left pubic bone involving the left superior and inferior pubic rami with probable extension into the left parasymphyseal region.  2. Recent progression of poorly differentiated IgA lambda multiple myeloma with minimal M spike, status post 2 cycles of Doxil every 28 days.  3. Severe deconditioning 4. Renal sufficiency 5. Degenerative joint and disc disease of her spine along with a myelomatous damage of her spine with compression fractures causing chronic back pain for which she is on hydrocodone 10/325 mg. She is also on Zometa for bone protection from myeloma damage.   PLAN:  1. Recommend orthopedic consultation for consideration of surgical intervention.  Order has been placed.  2. Will continue to follow while an inpatient.   All questions were answered. The patient knows  to call the clinic with any problems, questions or concerns. We can certainly see the patient much sooner if necessary.  The patient and plan discussed with Glenford Peers, MD and he is in agreement with the aforementioned.  Onesimo Lingard

## 2012-03-11 ENCOUNTER — Encounter (HOSPITAL_COMMUNITY): Payer: Self-pay | Admitting: Internal Medicine

## 2012-03-11 ENCOUNTER — Ambulatory Visit (HOSPITAL_COMMUNITY): Payer: Medicare Other | Admitting: Oncology

## 2012-03-11 DIAGNOSIS — R112 Nausea with vomiting, unspecified: Secondary | ICD-10-CM

## 2012-03-11 DIAGNOSIS — D696 Thrombocytopenia, unspecified: Secondary | ICD-10-CM

## 2012-03-11 DIAGNOSIS — M311 Thrombotic microangiopathy: Secondary | ICD-10-CM

## 2012-03-11 DIAGNOSIS — S32509A Unspecified fracture of unspecified pubis, initial encounter for closed fracture: Secondary | ICD-10-CM

## 2012-03-11 DIAGNOSIS — S0083XA Contusion of other part of head, initial encounter: Secondary | ICD-10-CM | POA: Diagnosis present

## 2012-03-11 DIAGNOSIS — E871 Hypo-osmolality and hyponatremia: Secondary | ICD-10-CM

## 2012-03-11 DIAGNOSIS — B372 Candidiasis of skin and nail: Secondary | ICD-10-CM

## 2012-03-11 LAB — CBC
Platelets: 79 10*3/uL — ABNORMAL LOW (ref 150–400)
RDW: 15.8 % — ABNORMAL HIGH (ref 11.5–15.5)
WBC: 12.8 10*3/uL — ABNORMAL HIGH (ref 4.0–10.5)

## 2012-03-11 LAB — GLUCOSE, CAPILLARY
Glucose-Capillary: 97 mg/dL (ref 70–99)
Glucose-Capillary: 94 mg/dL (ref 70–99)
Glucose-Capillary: 189 mg/dL — ABNORMAL HIGH (ref 70–99)

## 2012-03-11 LAB — BASIC METABOLIC PANEL
CO2: 22 mEq/L (ref 19–32)
Calcium: 9.3 mg/dL (ref 8.4–10.5)
Chloride: 99 mEq/L (ref 96–112)
Chloride: 98 mEq/L (ref 96–112)
Creatinine, Ser: 1.66 mg/dL — ABNORMAL HIGH (ref 0.50–1.10)
Creatinine, Ser: 1.76 mg/dL — ABNORMAL HIGH (ref 0.50–1.10)
GFR calc Af Amer: 33 mL/min — ABNORMAL LOW (ref >90)
GFR calc Af Amer: 31 mL/min — ABNORMAL LOW (ref >90)
GFR calc non Af Amer: 29 mL/min — ABNORMAL LOW (ref >90)
Potassium: 4.6 mEq/L (ref 3.5–5.1)

## 2012-03-11 MED ORDER — OXYCODONE HCL 5 MG PO TABS: 5.0000 mg | ORAL_TABLET | ORAL | Status: AC | PRN

## 2012-03-11 MED ORDER — NYSTATIN 100000 UNIT/GM EX CREA: TOPICAL_CREAM | Freq: Two times a day (BID) | CUTANEOUS | Status: DC

## 2012-03-11 MED ORDER — DIAZEPAM 5 MG PO TABS: 5.0000 mg | ORAL_TABLET | Freq: Four times a day (QID) | ORAL | Status: DC | PRN

## 2012-03-11 MED ORDER — SODIUM CHLORIDE 0.9 % IV SOLN
INTRAVENOUS | Status: DC
  Administered 2012-03-11: 17:00:00 via INTRAVENOUS

## 2012-03-11 MED ORDER — NYSTATIN 100000 UNIT/GM EX CREA
TOPICAL_CREAM | Freq: Two times a day (BID) | CUTANEOUS | Status: DC
Start: 2012-03-11 — End: 2012-03-12
  Administered 2012-03-11 – 2012-03-12 (×3): via TOPICAL
  Filled 2012-03-11: qty 15

## 2012-03-11 MED ORDER — SODIUM CHLORIDE 0.9 % IJ SOLN
INTRAMUSCULAR | Status: AC
  Administered 2012-03-11: 16:00:00
  Filled 2012-03-11: qty 3

## 2012-03-11 MED ORDER — ACETAMINOPHEN 325 MG PO TABS: 650.0000 mg | ORAL_TABLET | Freq: Two times a day (BID) | ORAL | Status: DC

## 2012-03-11 MED ORDER — FUROSEMIDE 20 MG PO TABS: 20.0000 mg | ORAL_TABLET | Freq: Every day | ORAL | Status: DC

## 2012-03-11 MED ORDER — PREDNISONE 20 MG PO TABS: ORAL_TABLET | ORAL | Status: DC

## 2012-03-11 MED ORDER — FUROSEMIDE 20 MG PO TABS
20.0000 mg | ORAL_TABLET | Freq: Every day | ORAL | Status: DC
  Administered 2012-03-12: 20 mg via ORAL
  Filled 2012-03-11: qty 1

## 2012-03-11 MED ORDER — ONDANSETRON HCL 8 MG PO TABS: 8.0000 mg | ORAL_TABLET | Freq: Three times a day (TID) | ORAL | Status: DC | PRN

## 2012-03-11 MED ORDER — SODIUM CHLORIDE 0.9 % IJ SOLN
INTRAMUSCULAR | Status: AC
  Filled 2012-03-11: qty 3

## 2012-03-11 MED ORDER — ALBUTEROL SULFATE (5 MG/ML) 0.5% IN NEBU: 2.5000 mg | INHALATION_SOLUTION | RESPIRATORY_TRACT | Status: DC

## 2012-03-11 NOTE — Progress Notes (Signed)
This encounter was created in error - please disregard.

## 2012-03-11 NOTE — Progress Notes (Signed)
Drsg changed to Rt inguinal area, cleaned and mepilex border applied.

## 2012-03-11 NOTE — Clinical Social Work Note (Signed)
CSW presented bed offers and pt chooses [facility].  Facility aware.  Awaiting stability for d/c.   Karn Cassis

## 2012-03-11 NOTE — Clinical Social Work Placement (Signed)
Clinical Social Work Department CLINICAL SOCIAL WORK PLACEMENT NOTE 03/11/2012  Patient:  [name], [name]  Account Number:  [id number] Admit date:  03/08/2012  Clinical Social Worker:  Sherrlyn Hock  Date/time:  03/10/2012 02:30 PM  Clinical Social Work is seeking post-discharge placement for this patient at the following level of care:   SKILLED NURSING   (*CSW will update this form in Epic as items are completed)   03/10/2012  Patient/family provided with [facility] Department of Clinical Social Work's list of facilities offering this level of care within the geographic area requested by the patient (or if unable, by the patient's family).  03/10/2012  Patient/family informed of their freedom to choose among providers that offer the needed level of care, that participate in Medicare, Medicaid or managed care program needed by the patient, have an available bed and are willing to accept the patient.  03/10/2012  Patient/family informed of [facility]' ownership interest in [facility], as well as of the fact that they are under no obligation to receive care at this facility.  PASARR submitted to EDS on  PASARR number received from EDS on   FL2 transmitted to all facilities in geographic area requested by pt/family on  03/10/2012 FL2 transmitted to all facilities within larger geographic area on   Patient informed that his/her managed care company has contracts with or will negotiate with  certain facilities, including the following:     Patient/family informed of bed offers received:  03/11/2012 Patient chooses bed at [facility] Physician recommends and patient chooses bed at  [facility]  Patient to be transferred to  on   Patient to be transferred to facility by   The following physician request were entered in Epic:   Additional Comments: Previous pasarr number.  Karn Cassis

## 2012-03-11 NOTE — Care Management Note (Signed)
    Page 1 of 1   03/12/2012     2:56:07 PM   CARE MANAGEMENT NOTE 03/12/2012  Patient:  [name], [name]   Account Number:  [id number]  Date Initiated:  03/11/2012  Documentation initiated by:  Rosemary Holms  Subjective/Objective Assessment:   pt admitted from home with famiily with Fx of pelvis     Action/Plan:   PT has evaluated pt and recommended  SNF. [facility] bed has been offered.   Anticipated DC Date:  03/12/2012   Anticipated DC Plan:  SKILLED NURSING FACILITY      DC Planning Services  CM consult      Choice offered to / List presented to:             Status of service:  Completed, signed off Medicare Important Message given?   (If response is "NO", the following Medicare IM given date fields will be blank) Date Medicare IM given:   Date Additional Medicare IM given:    Discharge Disposition:  SKILLED NURSING FACILITY  Per UR Regulation:    If discussed at Long Length of Stay Meetings, dates discussed:    Comments:  03/11/12 1300 Etherine Mackowiak Leanord Hawking RN [facility]

## 2012-03-11 NOTE — Progress Notes (Signed)
Subjective: The patient is seen lying in bed.  She denies any discomfort presently.  Her pain is well controlled.  I note her thrombocytopenia.  Her Avelox was discontinued.  She is on low-dose Lovenox.  Will discontinue this medication as well for it may be playing a part in her thrombocytopenia.   The patient asks for [place] to look at her right infra-abdominal fold.  It is noted that she has an old dressing within the fold.  This appears to be an acute candidiasis breakdown.  Recommend nursing attention to this.  The patient re-discussed her fall and the sequence of events.  She missed a chair that she was intending to sit on and this cause her to fall on the floor.   Objective: Vital signs in last 24 hours: Temp:  [98.2 F (36.8 C)-98.8 F (37.1 C)] 98.5 F (36.9 C) (05/21 0559) Pulse Rate:  [70-94] 77  (05/21 0559) Resp:  [17-20] 20  (05/21 0559) BP: (111-152)/(54-87) 119/79 mmHg (05/21 0559) SpO2:  [92 %-97 %] 95 % (05/21 0747) FiO2 (%):  [21 %] 21 % (05/21 0747) Weight:  [210 lb 8.6 oz (95.5 kg)-210 lb 15.7 oz (95.7 kg)] 210 lb 8.6 oz (95.5 kg) (05/21 0559)  Intake/Output from previous day: 05/20 0800 - 05/21 0759 In: 240 [P.O.:240] Out: 550 [Urine:550] Intake/Output this shift:    General appearance: alert, cooperative, no distress and moderately obese Abdomen: A right sided open area appreciated in a longitudinal fashion with old dressing in place.  Lab Results:   Basename 03/11/12 0537 03/10/12 0501  WBC 12.8* 12.5*  HGB 8.5* 8.8*  HCT 25.6* 26.7*  PLT 79* 108*   BMET  Basename 03/11/12 0537 03/10/12 0501  NA 133* 135  K 4.3 4.5  CL 99 103  CO2 24 23  GLUCOSE 100* 129*  BUN 43* 43*  CREATININE 1.66* 1.48*  CALCIUM 9.3 9.3     Medications: I have reviewed the patient's current medications.  Assessment/Plan: 1. Recent progression of poorly differentiated IgA lambda multiple myeloma with minimal M spike, status post 1 cycle of Doxil every 28 days.  2.  Thrombocytopenia, potentially Lovenox induced.  Will discontinued.  3. Acute pubic rami fracture, question pathologic versus osteoporotic.  Will review films with radiologist.  If pathologic, patient may benefit from radiation to the affected area.  If not, will not pursue radiation. 4. Acute candidiasis-induced skin breakdown in the infra-abdominal fold on the right.  Recommend nursing attention to this area.     LOS: 3 days    Kristain Hu 03/11/2012

## 2012-03-11 NOTE — Discharge Summary (Signed)
Physician Discharge Summary  [name] MRN: [id number] DOB/AGE: 05-05-1933 78 y.o.  PCP: [name], MD, MD   Admit date: 03/08/2012 Discharge date: 03/12/2012  Discharge Diagnoses:  1. Acute pubic rami fractures on the left, secondary to fall. The fractures appear to be nonpathological. 2. Acute bronchitis, residual from the previous hospitalization. 3. Progressive thrombocytopenia, exact etiology not elucidated, however, Lovenox was suspected. The patient's platelet count was 155 on admission and 79 as of today. THE PATIENT'S PLATELET COUNT WILL NEED TO BE REASSESSED IN 3-5 DAYS. 4. Multiple myeloma, undergoing chemotherapy with oncologist Dr. Jodene Nam. Overall, poor prognosis. 5. Anemia of chronic disease. Hemoglobin ranges from 8.5-9.5. History of pernicious anemia. 6. Stage II to stage III chronic kidney disease. The patient's creatinine was trending up at the time of discharge. Therefore, the dose of Lasix was decreased from 40 mg daily to 20 mg daily and Aldactone was placed on hold. THE PATIENT'S BUN AND CREATININE WILL NEED TO BE REASSESSED IN 3-5 DAYS. 7. Hyponatremia, possibly secondary to mild volume depletion. THE PATIENT'S SERUM SODIUM WILL NEED TO BE REASSESSED IN 3-5 DAYS. 8. Hypokalemia, resolved. 10. Low TSH. FREE T3 AND FREE T4 RESULTS PENDING. 11. Hypertension. 12. Gastroesophageal reflux disease 13. Morbid obesity. 14. Steroid-induced hyperglycemia. The patient's hemoglobin A1c was 6.3. 15. Peripheral neuropathy. 16. Candidal intertrigo. Topical nystatin ordered for treatment. 17. Osteoporosis. 18. Left cheek contusion secondary to fall. Appropriate wound care given. 19. Physical deconditioning, necessitating planned discharge the skilled nursing facility.    Medication List  As of 03/11/2012  5:11 PM   STOP taking these medications         ALPRAZolam 0.5 MG tablet      HYDROcodone-acetaminophen 10-325 MG per tablet      predniSONE 10 MG  tablet      spironolactone 50 MG tablet         TAKE these medications         acetaminophen 325 MG tablet   Commonly known as: TYLENOL   Take 2 tablets (650 mg total) by mouth 2 (two) times daily.      albuterol (5 MG/ML) 0.5% nebulizer solution   Commonly known as: PROVENTIL   Take 0.5 mLs (2.5 mg total) by nebulization every 4 (four) hours.      amLODipine 5 MG tablet   Commonly known as: NORVASC   Take 1 tablet (5 mg total) by mouth daily.      diazepam 5 MG tablet   Commonly known as: VALIUM   Take 1 tablet (5 mg total) by mouth every 6 (six) hours as needed. For sleep/anxiety, Is taking 2 at bedtime      docusate sodium 100 MG capsule   Commonly known as: COLACE   Take 100 mg by mouth every Monday, Wednesday, and Friday. Takes in the evening      folic acid 1 MG tablet   Commonly known as: FOLVITE   Take 1 mg by mouth daily.      furosemide 20 MG tablet   Commonly known as: LASIX   Take 1 tablet (20 mg total) by mouth daily.      guaiFENesin 600 MG 12 hr tablet   Commonly known as: MUCINEX   Take 2 tablets (1,200 mg total) by mouth 2 (two) times daily.      loperamide 2 MG tablet   Commonly known as: IMODIUM A-D   Take 2 mg by mouth 4 (four) times daily as needed.  NU-IRON 150 MG capsule   Generic drug: iron polysaccharides   Take 150 mg by mouth every Monday, Wednesday, and Friday.      nystatin cream   Commonly known as: MYCOSTATIN   Apply topically 2 (two) times daily.      omeprazole 20 MG capsule   Commonly known as: PRILOSEC   Take 1 capsule (20 mg total) by mouth daily.      ondansetron 8 MG tablet   Commonly known as: ZOFRAN   Take 1 tablet (8 mg total) by mouth every 8 (eight) hours as needed. nausea      oxyCODONE 5 MG immediate release tablet   Commonly known as: Oxy IR/ROXICODONE   Take 1-2 tablets (5-10 mg total) by mouth every 4 (four) hours as needed.      potassium chloride SA 20 MEQ tablet   Commonly known as: K-DUR,KLOR-CON     Take 20 mEq by mouth every Monday, Wednesday, and Friday.      predniSONE 20 MG tablet   Commonly known as: DELTASONE   TAKE 1 TABLET DAILY WITH BREAKFAST FOR 2 MORE DAYS AND THEN A HALF A TABLET DAILY WITH BREAKFAST FOR 2 MORE DAYS AND THEN STOP.      Vitamin D3 1000 UNITS Caps   Take 1 capsule by mouth daily.            Discharge Condition: Stable.  Disposition: Skilled nursing facility.   Consults: Oncologist Glenford Peers, M.D. and orthopedic surgeon Darreld Mclean, M.D.   Significant Diagnostic Studies: Dg Chest 1 View  03/08/2012  *RADIOLOGY REPORT*  Clinical Data: Shortness of breath, weakness.  History of fall today.  CHEST - 1 VIEW  Comparison: Chest x-ray 03/02/2012.  Findings: Lungs are well expanded bilaterally.  There are linear opacities in the left lower lobe, favored to represent areas of subsegmental atelectasis.  No definite consolidative airspace disease.  No definite pleural effusions.  Pulmonary vasculature is within normal limits.  Heart size is mildly enlarged.  Upper mediastinal contours appears slightly widened, however, this is likely accentuated by the lordotic positioning.  Atherosclerotic calcifications within the arch of the aorta.  A right subclavian single lumen Port-A-Cath is in position with tip terminating in the mid superior vena cava.  Compared to the prior examination, there appears to be a healing nondisplaced fracture of the mid left clavicle.  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  IMPRESSION: 1.  Small amount of subsegmental atelectasis in the left lower lobe.  No other radiographic evidence of acute cardiopulmonary disease. 2.  Atherosclerosis. 3.  Mild cardiomegaly with prominence of the left ventricular contour which may suggest underlying left ventricular hypertrophy. 4.  Healing fracture of the left mid clavicle, and the old fracture through the base of the acromion process with  chronic nonunion, as above.  Original Report Authenticated By: Florencia Reasons, M.D.   Dg Chest 2 View  03/02/2012  *RADIOLOGY REPORT*  Clinical Data: Shortness of breath and wheezing.  CHEST - 2 VIEW  Comparison: Two-view chest 02/29/2012.  Findings: A right subclavian Port-A-Cath is stable.  The port is accessed.  Mild cardiac enlargement is noted.  Emphysematous changes are present.  Bibasilar atelectasis or scarring is similar to the prior exam.  IMPRESSION:  1.  Stable mild cardiomegaly without failure. 2.  Emphysema. 3.  Mild bibasilar atelectasis or scarring. 4.  The right subclavian Port-A-Cath is accessed.  Original Report Authenticated By:  CHRISTOPHER W. MATTERN, M.D.   Dg Chest 2 View  02/29/2012  *RADIOLOGY REPORT*  Clinical Data: Recurrent multiple myeloma.  Short of breath. Wheezing.  CHEST - 2 VIEW  Comparison: 12/18/2011  Findings: Cardiomegaly shows no significant change.  Mild bibasilar scarring is stable.  No evidence of acute infiltrate or edema.  No evidence of pleural effusion.  No mass or lymphadenopathy identified.  Right-sided Port-A-Cath remains in appropriate position. Incidental note is made of several lower thoracic and lumbar vertebral body compression deformities.  IMPRESSION: Stable cardiomegaly and mild bibasilar scarring.  No active disease.  Original Report Authenticated By: Danae Orleans, M.D.   Dg Hip Complete Left  03/08/2012  *RADIOLOGY REPORT*  Clinical Data: Hip pain status post fall today.  LEFT HIP - COMPLETE 2+ VIEW  Comparison: No priors.  Findings: There are acute minimally displaced fractures through the left superior and inferior pubic ramus, with probable extension into the parasymphyseal region of the pubis on the left.  No additional displaced pelvic fractures are otherwise noted.  The visualized proximal femurs are intact, and the left femoral head is properly located.  Mild degenerative changes of osteoarthritis are noted the hip joints bilaterally.   IMPRESSION: 1.  Acute fracture of the left pubic bone involving the left superior and inferior pubic rami with probable extension into the left parasymphyseal region.  Original Report Authenticated By: Florencia Reasons, M.D.     Microbiology: No results found for this or any previous visit (from the past 240 hour(s)).   Labs: Results for orders placed during the hospital encounter of 03/08/12 (from the past 48 hour(s))  GLUCOSE, CAPILLARY     Status: Abnormal   Collection Time   03/09/12  8:14 PM      Component Value Range Comment   Glucose-Capillary 146 (*) 70 - 99 (mg/dL)   BASIC METABOLIC PANEL     Status: Abnormal   Collection Time   03/10/12  5:01 AM      Component Value Range Comment   Sodium 135  135 - 145 (mEq/L)    Potassium 4.5  3.5 - 5.1 (mEq/L) DELTA CHECK NOTED   Chloride 103  96 - 112 (mEq/L)    CO2 23  19 - 32 (mEq/L)    Glucose, Bld 129 (*) 70 - 99 (mg/dL)    BUN 43 (*) 6 - 23 (mg/dL)    Creatinine, Ser 8.41 (*) 0.50 - 1.10 (mg/dL)    Calcium 9.3  8.4 - 10.5 (mg/dL)    GFR calc non Af Amer 33 (*) >90 (mL/min)    GFR calc Af Amer 38 (*) >90 (mL/min)   CBC     Status: Abnormal   Collection Time   03/10/12  5:01 AM      Component Value Range Comment   WBC 12.5 (*) 4.0 - 10.5 (K/uL)    RBC 2.70 (*) 3.87 - 5.11 (MIL/uL)    Hemoglobin 8.8 (*) 12.0 - 15.0 (g/dL)    HCT 32.4 (*) 40.1 - 46.0 (%)    MCV 98.9  78.0 - 100.0 (fL)    MCH 32.6  26.0 - 34.0 (pg)    MCHC 33.0  30.0 - 36.0 (g/dL)    RDW 02.7 (*) 25.3 - 15.5 (%)    Platelets 108 (*) 150 - 400 (K/uL)   GLUCOSE, CAPILLARY     Status: Abnormal   Collection Time   03/10/12  7:30 AM      Component Value Range Comment  Glucose-Capillary 104 (*) 70 - 99 (mg/dL)    Comment 1 Notify RN     VITAMIN B12     Status: Abnormal   Collection Time   03/10/12  8:00 AM      Component Value Range Comment   Vitamin B-12 1293 (*) 211 - 911 (pg/mL)   TSH     Status: Abnormal   Collection Time   03/10/12  8:00 AM       Component Value Range Comment   TSH 0.080 (*) 0.350 - 4.500 (uIU/mL)   GLUCOSE, CAPILLARY     Status: Abnormal   Collection Time   03/10/12 11:18 AM      Component Value Range Comment   Glucose-Capillary 117 (*) 70 - 99 (mg/dL)    Comment 1 Notify RN     GLUCOSE, CAPILLARY     Status: Abnormal   Collection Time   03/10/12  4:08 PM      Component Value Range Comment   Glucose-Capillary 213 (*) 70 - 99 (mg/dL)    Comment 1 Notify RN     GLUCOSE, CAPILLARY     Status: Abnormal   Collection Time   03/10/12 10:07 PM      Component Value Range Comment   Glucose-Capillary 128 (*) 70 - 99 (mg/dL)    Comment 1 Notify RN     BASIC METABOLIC PANEL     Status: Abnormal   Collection Time   03/11/12  5:37 AM      Component Value Range Comment   Sodium 133 (*) 135 - 145 (mEq/L)    Potassium 4.3  3.5 - 5.1 (mEq/L)    Chloride 99  96 - 112 (mEq/L)    CO2 24  19 - 32 (mEq/L)    Glucose, Bld 100 (*) 70 - 99 (mg/dL)    BUN 43 (*) 6 - 23 (mg/dL)    Creatinine, Ser 1.19 (*) 0.50 - 1.10 (mg/dL)    Calcium 9.3  8.4 - 10.5 (mg/dL)    GFR calc non Af Amer 29 (*) >90 (mL/min)    GFR calc Af Amer 33 (*) >90 (mL/min)   CBC     Status: Abnormal   Collection Time   03/11/12  5:37 AM      Component Value Range Comment   WBC 12.8 (*) 4.0 - 10.5 (K/uL)    RBC 2.59 (*) 3.87 - 5.11 (MIL/uL)    Hemoglobin 8.5 (*) 12.0 - 15.0 (g/dL)    HCT 14.7 (*) 82.9 - 46.0 (%)    MCV 98.8  78.0 - 100.0 (fL)    MCH 32.8  26.0 - 34.0 (pg)    MCHC 33.2  30.0 - 36.0 (g/dL)    RDW 56.2 (*) 13.0 - 15.5 (%)    Platelets 79 (*) 150 - 400 (K/uL) DELTA CHECK NOTED  GLUCOSE, CAPILLARY     Status: Normal   Collection Time   03/11/12  7:36 AM      Component Value Range Comment   Glucose-Capillary 97  70 - 99 (mg/dL)    Comment 1 Notify RN     GLUCOSE, CAPILLARY     Status: Normal   Collection Time   03/11/12 11:21 AM      Component Value Range Comment   Glucose-Capillary 94  70 - 99 (mg/dL)    Comment 1 Notify RN     BASIC  METABOLIC PANEL     Status: Abnormal   Collection Time   03/11/12  3:27 PM  Component Value Range Comment   Sodium 131 (*) 135 - 145 (mEq/L)    Potassium 4.6  3.5 - 5.1 (mEq/L)    Chloride 98  96 - 112 (mEq/L)    CO2 22  19 - 32 (mEq/L)    Glucose, Bld 188 (*) 70 - 99 (mg/dL)    BUN 45 (*) 6 - 23 (mg/dL)    Creatinine, Ser 4.78 (*) 0.50 - 1.10 (mg/dL)    Calcium 9.7  8.4 - 10.5 (mg/dL)    GFR calc non Af Amer 27 (*) >90 (mL/min)    GFR calc Af Amer 31 (*) >90 (mL/min)   GLUCOSE, CAPILLARY     Status: Abnormal   Collection Time   03/11/12  4:31 PM      Component Value Range Comment   Glucose-Capillary 189 (*) 70 - 99 (mg/dL)    Comment 1 Notify RN        HPI : The patient is a 78 year old woman with a history significant for multiple myeloma, osteoporosis, hypertension, and COPD. She was discharged from the hospital on 03/05/2012 for treatment of COPD exacerbation. She presented again to the emergency department on 03/08/2012 with pelvic pain and the inability to walk after she missed a chair when she attempted to sit down. She subsequently fell on the floor. In the emergency department, she was noted to be afebrile and hemodynamically stable. Her lab data were significant for a WBC of 18.3, hemoglobin of 10.2, platelet count of 155, sodium of 132, potassium of 3.3, and creatinine of 1.5. X-ray of her left hip revealed multiple pubic rami fractures. She was admitted for further evaluation and management.  HOSPITAL COURSE: The patient was started on supportive treatment with analgesics as needed for pain. She was continued on bronchodilator therapy and steroid therapy for treatment of residual acute bronchitis from the previous hospitalization. Her white blood cell count was elevated but she was afebrile. In the setting of immunocompromise and rehospitalization, Avelox was started empirically. For treatment of hyperglycemia, she was started on sliding scale NovoLog. Her hemoglobin A1c was 6.3.  The hyperglycemia was thought to be steroid induced.  1. Acute left pubic rami fractures. The patient was treated with as needed oxycodone or as needed hydromorphone for pain. Orthopedic surgeon, Dr. Hilda Lias was consulted. There was no indication for surgical intervention. Dr. Hilda Lias recommended bed to chair transfers for the next few days, then with weightbearing as tolerated with a walker. He ordered an overhead frame and trapeze to assist the patient with pulling herself up. The physical therapist evaluated the patient and assisted with transfers. Oncologist, Dr. Jodene Nam review the x-rays with the radiologist to see if they were consistent with pathological fractures. They were not. If they were felt to be pathological fractures, he would have recommended radiation therapy. The patient will be discharged to the [facility] for rehabilitation and pain management.  2. Thrombocytopenia. The patient has a history of thrombocytopenia, but her platelet count was within normal limits on admission. It began to fall precipitously. Lovenox was inadvertently continued when it was meant to be discontinued. Oncologist, Dr. Jodene Nam was consulted. Following his consultation, the Lovenox was discontinued. For further evaluation, her vitamin B12 level and thyroid function were assessed. Her vitamin B12 level was greater than 1200. Her TSH was low, but the results of her free T4 and free T3 were pending at the time of discharge. In addition, Avelox was discontinued as it may have been a contributing factor, but this was not  confirmed. The exact etiology of her worsening thrombocytopenia was not clearly elucidated. Her platelet count should be monitored closely following discharge in the next 3 days. She will followup with Dr. Laurie Panda accordingly.  3. Chronic anemia, secondary to multiple myeloma. The patient's hemoglobin was 10.2 on admission. However, during the previous hospitalization, her hemoglobin ranged from  8.3-8.7. During this hospitalization, her hemoglobin fell gradually to baseline of 8.5. She had no significant induration or edema of her pelvis suggestive of bleeding or a hematoma. Her hemoglobin will need to be monitored closely following hospital discharge.  4. Chronic kidney disease/hyponatremia. The patient was continued on Lasix and Aldactone. Her creatinine on admission was 1.5. However, during the previous hospitalization, it had increased to 1.87. Her creatinine began to trend upward again. Her serum sodium began to decrease. This was thought to be secondary to mild volume depletion. Therefore, Aldactone was temporarily discontinued and the dose of Lasix was decreased to 20 mg from 40 mg daily. Gentle IV fluids will be started overnight. Her followup labs are pending for tomorrow morning. Following discharge, her fluid status will need to be assessed. Aldactone may need to be restarted and the dose of Lasix may need to be increased back to 40 mg daily in several days.  5. Abnormal thyroid function tests. Her TSH was found to be low at 0.080. Free T3 and free T4 were ordered. The results are currently pending. They will need to be followed up upon tomorrow or at the skilled nursing facility. The patient may require an endocrinology evaluation.    6. COPD with acute bronchitis. This is residual from the previous hospitalization. She was continued on the prednisone taper. As stated above, Avelox was started empirically, but after a couple days, it was discontinued when her platelet count began to decrease. She is currently afebrile. Her leukocytosis was attributable to steroids. Her hypoglycemia was also attributable to steroids. She was treated with sliding scale NovoLog accordingly. It is likely that her blood glucose will normalize or approach normal following the discontinuation of the prednisone taper after discharge.  7. Multiple myeloma. The patient is currently being treated with Doxil by the  oncology team. Her multiple myeloma has progressed to poorly differentiated IgA lambda multiple myeloma with minimal M spike. She is status post 2 cycles of Doxil every 28 days. She will continue to followup with Dr. Jodene Nam as per their recommendation.  Discharge Exam: Blood pressure 124/86, pulse 82, temperature 98.9 F (37.2 C), temperature source Oral, resp. rate 18, height 5\' 2"  (1.575 m), weight 95.5 kg (210 lb 8.6 oz), SpO2 94.00%.  General: 78 year old Caucasian woman, lying in bed, in no acute distress. HEENT: There is a moderate-sized contusion with superficial excoriation on the left side of her face, with surrounding ecchymosis. No active bleeding. Mildly tender. Lungs: Occasional wheezes and crackles auscultated in her upper lobes, partially decreased with coughing. Heart: S1, S2, with no murmurs rubs or gallops. Abdomen: Positive bowel sounds, soft, obese, nontender, nondistended. Pelvis: Mild to moderate tenderness over the left pelvis with no surrounding induration or large ecchymosis. Extremities: No pedal edema. Inguinal region. Erythema and superficial excoriations in the inguinal folds and abdominal pannus, mildly malodorous.   Discharge Orders    Future Appointments: Provider: Department: Dept Phone: Center:   04/21/2012 10:30 AM [facility] Ap-Cardiopulmonary Svc  None   05/08/2012 1:40 PM Jonelle Sidle, MD [facility] [phone] [id number]     Future Orders Please Complete By Expires   Diet general  Other Restrictions      Comments:   TRANSFER WITH ASSISTANCE FROM BED TO CHAIR AND FROM CHAIR TO BED FOR THE NEXT 2-3 DAYS; THEN WEIGHT BEARING AS TOLERATED WITH PHYSICAL THERAPY.      Follow-up Information    Follow up with Randall An, MD. (Dr. Marita [place] office will contact you with the followup appointment.)    Contact information:   [address] [address] [phone]         Total discharge time:  45 minutes.   Signed: Marcey Persad 03/11/2012, 5:11 PM

## 2012-03-11 NOTE — Progress Notes (Signed)
Physical Therapy Treatment Patient Details Name: [name] MRN: [id number] DOB: 1933/05/03 Today's Date: 03/11/2012 Time: 4098-1191 PT Time Calculation (min): 41 min Charges:  There ex, there activity  PT Assessment / Plan / Recommendation Comments on Treatment Session  Pt. able to complete exercises supine, requiring assistance for heelslides, hip abd due to pain.  Pt. unable to perform SLR due to pain.  Pt. motivated/willing to participate with therapy.                      Precautions / Restrictions Precautions Precautions: Fall Restrictions Weight Bearing Restrictions: No       Mobility  Bed Mobility Bed Mobility: Supine to Sit Supine to Sit: 2: Max assist (assitance with L LE) Transfers Transfers: Sit to Stand Sit to Stand: 3: Mod assist;From elevated surface;With upper extremity assist Stand to Sit: 3: Mod assist;With upper extremity assist Stand Pivot Transfers: 3: Mod assist;From elevated surface Details for Transfer Assistance: Stood in walker but unable to slide R foot over to chair; completed transfer with pivot to chair with Mod Assist    Exercises General Exercises - Lower Extremity Ankle Circles/Pumps: AROM;Both;10 reps;Supine Quad Sets: AROM;Both;10 reps;Supine Heel Slides: AAROM;Left;5 reps;Supine Hip ABduction/ADduction: AAROM;Left;5 reps;Supine     Visit Information  Last PT Received On: 03/11/12    Subjective Data  Subjective: Pt. states it hurts when she moves it.  Dr. Hilda Hartman and [name] present during treatment; per Dr. Hilda Hartman, bed to chair transfers only next couple days as pain allows then progress to gait.         End of Session PT - End of Session Equipment Utilized During Treatment: Gait belt Activity Tolerance: Patient limited by pain Patient left: in chair;with call bell/phone within reach    [name] B. [name], PTA 03/11/2012, 12:01 PM

## 2012-03-11 NOTE — Consult Note (Signed)
Reason for Consult:Fracture of the left pubic bones Referring Physician: Talli Hartman is an 78 y.o. female.  HPI: She fell at home several days ago while trying to get back into her lift chair.  She normally uses a walker. She has a long history of multiple myeloma and is weak.  She was admitted as she could not stand or walk with walker.  X-rays show fracture of the superior and inferior pubic rami with minimal displacement.  She is able to void spontaneously.   She hit her cheek and has an ecchymotic area there.  Past Medical History  Diagnosis Date  . Essential hypertension, benign     LVH; RVH; normal EF in 04/2011 with the basilar inferior wall akinesis  . Edema   . Obesity   . Osteoporosis     Lumbar and thoracic compression fractures  . Multiple myeloma 2007    IgA lambda  . Gastroesophageal reflux disease   . Pernicious anemia     Plus thrombocytopenia;H./H.-9.8/29 in 12/2010  . Chronic kidney disease, stage 2, mildly decreased GFR     Creatinine of 1.3 in 09/2010; acute renal failure in 11/2010 secondary to diuretics with hyperkalemia secondary to Spiriva lactone  . Peripheral neuropathy     Complicating chemotherapy  . Chronic low back pain   . Chronic steroid use   . History of candidiasis   . Allergy     Revlimid  . Anemia of chronic disease   . B12 deficiency   . COPD (chronic obstructive pulmonary disease)   . Hyperglycemia, drug-induced 03/09/2012    Past Surgical History  Procedure Date  . Total abdominal hysterectomy w/ bilateral salpingoophorectomy     Incidental appendectomy  . Rotator cuff repair     Left  . Vein surgery     Ligation and stripping bilaterally  . Orif ankle fracture     Right  . Carpal tunnel release     Right  . Central venous catheter tunneled insertion single lumen 2012    Family History  Problem Relation Age of Onset  . Heart attack Brother     Social History:  reports that she has never smoked. She has never  used smokeless tobacco. She reports that she does not drink alcohol or use illicit drugs.  Allergies:  Allergies  Allergen Reactions  . Lenalidomide Rash    Medications: I have reviewed the patient's current medications.  Results for orders placed during the hospital encounter of 03/08/12 (from the past 48 hour(s))  GLUCOSE, CAPILLARY     Status: Abnormal   Collection Time   03/09/12 11:47 AM      Component Value Range Comment   Glucose-Capillary 127 (*) 70 - 99 (mg/dL)   GLUCOSE, CAPILLARY     Status: Abnormal   Collection Time   03/09/12  4:41 PM      Component Value Range Comment   Glucose-Capillary 190 (*) 70 - 99 (mg/dL)    Comment 1 Notify RN     GLUCOSE, CAPILLARY     Status: Abnormal   Collection Time   03/09/12  8:14 PM      Component Value Range Comment   Glucose-Capillary 146 (*) 70 - 99 (mg/dL)   BASIC METABOLIC PANEL     Status: Abnormal   Collection Time   03/10/12  5:01 AM      Component Value Range Comment   Sodium 135  135 - 145 (mEq/L)    Potassium 4.5  3.5 -  5.1 (mEq/L) DELTA CHECK NOTED   Chloride 103  96 - 112 (mEq/L)    CO2 23  19 - 32 (mEq/L)    Glucose, Bld 129 (*) 70 - 99 (mg/dL)    BUN 43 (*) 6 - 23 (mg/dL)    Creatinine, Ser 4.54 (*) 0.50 - 1.10 (mg/dL)    Calcium 9.3  8.4 - 10.5 (mg/dL)    GFR calc non Af Amer 33 (*) >90 (mL/min)    GFR calc Af Amer 38 (*) >90 (mL/min)   CBC     Status: Abnormal   Collection Time   03/10/12  5:01 AM      Component Value Range Comment   WBC 12.5 (*) 4.0 - 10.5 (K/uL)    RBC 2.70 (*) 3.87 - 5.11 (MIL/uL)    Hemoglobin 8.8 (*) 12.0 - 15.0 (g/dL)    HCT 09.8 (*) 11.9 - 46.0 (%)    MCV 98.9  78.0 - 100.0 (fL)    MCH 32.6  26.0 - 34.0 (pg)    MCHC 33.0  30.0 - 36.0 (g/dL)    RDW 14.7 (*) 82.9 - 15.5 (%)    Platelets 108 (*) 150 - 400 (K/uL)   GLUCOSE, CAPILLARY     Status: Abnormal   Collection Time   03/10/12  7:30 AM      Component Value Range Comment   Glucose-Capillary 104 (*) 70 - 99 (mg/dL)    Comment 1  Notify RN     VITAMIN B12     Status: Abnormal   Collection Time   03/10/12  8:00 AM      Component Value Range Comment   Vitamin B-12 1293 (*) 211 - 911 (pg/mL)   TSH     Status: Abnormal   Collection Time   03/10/12  8:00 AM      Component Value Range Comment   TSH 0.080 (*) 0.350 - 4.500 (uIU/mL)   GLUCOSE, CAPILLARY     Status: Abnormal   Collection Time   03/10/12 11:18 AM      Component Value Range Comment   Glucose-Capillary 117 (*) 70 - 99 (mg/dL)    Comment 1 Notify RN     GLUCOSE, CAPILLARY     Status: Abnormal   Collection Time   03/10/12  4:08 PM      Component Value Range Comment   Glucose-Capillary 213 (*) 70 - 99 (mg/dL)    Comment 1 Notify RN     GLUCOSE, CAPILLARY     Status: Abnormal   Collection Time   03/10/12 10:07 PM      Component Value Range Comment   Glucose-Capillary 128 (*) 70 - 99 (mg/dL)    Comment 1 Notify RN     BASIC METABOLIC PANEL     Status: Abnormal   Collection Time   03/11/12  5:37 AM      Component Value Range Comment   Sodium 133 (*) 135 - 145 (mEq/L)    Potassium 4.3  3.5 - 5.1 (mEq/L)    Chloride 99  96 - 112 (mEq/L)    CO2 24  19 - 32 (mEq/L)    Glucose, Bld 100 (*) 70 - 99 (mg/dL)    BUN 43 (*) 6 - 23 (mg/dL)    Creatinine, Ser 5.62 (*) 0.50 - 1.10 (mg/dL)    Calcium 9.3  8.4 - 10.5 (mg/dL)    GFR calc non Af Amer 29 (*) >90 (mL/min)    GFR calc Af Amer 33 (*) >  90 (mL/min)   CBC     Status: Abnormal   Collection Time   03/11/12  5:37 AM      Component Value Range Comment   WBC 12.8 (*) 4.0 - 10.5 (K/uL)    RBC 2.59 (*) 3.87 - 5.11 (MIL/uL)    Hemoglobin 8.5 (*) 12.0 - 15.0 (g/dL)    HCT 96.0 (*) 45.4 - 46.0 (%)    MCV 98.8  78.0 - 100.0 (fL)    MCH 32.8  26.0 - 34.0 (pg)    MCHC 33.2  30.0 - 36.0 (g/dL)    RDW 09.8 (*) 11.9 - 15.5 (%)    Platelets 79 (*) 150 - 400 (K/uL) DELTA CHECK NOTED  GLUCOSE, CAPILLARY     Status: Normal   Collection Time   03/11/12  7:36 AM      Component Value Range Comment   Glucose-Capillary 97   70 - 99 (mg/dL)    Comment 1 Notify RN       No results found.  Review of Systems  Constitutional: Positive for malaise/fatigue.  HENT: Negative.   Eyes: Negative.   Respiratory: Positive for cough and shortness of breath.   Cardiovascular: Negative.   Gastrointestinal: Positive for heartburn.  Genitourinary: Negative.   Musculoskeletal: Positive for falls (Fell at her home and injured the left pelvis).  Neurological: Positive for weakness.  Endo/Heme/Allergies: Bruises/bleeds easily.       History of multiple myeloma, pernicious anemia and platelet count abnormality.  History of osteoporosis perhaps made worse with drug therapies.  Psychiatric/Behavioral: Negative.    Blood pressure 109/74, pulse 82, temperature 98.2 F (36.8 C), temperature source Oral, resp. rate 20, height 5\' 2"  (1.575 m), weight 95.5 kg (210 lb 8.6 oz), SpO2 95.00%. Physical Exam  Constitutional: She is oriented to person, place, and time. She appears well-developed and well-nourished.  HENT:  Head: Normocephalic.       Large contusion of the left check with small abrasion  Eyes: Conjunctivae and EOM are normal. Pupils are equal, round, and reactive to light.  Neck: Normal range of motion. Neck supple.  Cardiovascular: Normal rate, regular rhythm, normal heart sounds and intact distal pulses.   Respiratory: Effort normal and breath sounds normal.  GI: Soft. Bowel sounds are normal.  Musculoskeletal: She exhibits tenderness (left hip and groin area).       Legs: Neurological: She is alert and oriented to person, place, and time. She has normal reflexes.  Skin: Skin is warm and dry.  Psychiatric: She has a normal mood and affect. Her behavior is normal. Judgment and thought content normal.    Assessment/Plan: Fracture of the left pubic rami, inferior and superior with minimal displacement.  I cannot tell from the x-rays if this is a pathologic fracture or not.  She will need bed to chair to bed transfers  for the next few days then weight bear as tolerated with a walker.  She will be able to have this done at a skilled nursing home/rehab facility.  I have written orders for physical therapy and overhead frame and trapeze.  Sameera Betton 03/11/2012, 10:50 AM

## 2012-03-12 ENCOUNTER — Inpatient Hospital Stay
Admission: RE | Admit: 2012-03-12 | Discharge: 2012-04-17 | Disposition: A | Discharge status: Nursing Home (Includes ICF) | Payer: Medicare Other | Source: Ambulatory Visit | Attending: Internal Medicine | Admitting: Internal Medicine

## 2012-03-12 DIAGNOSIS — E871 Hypo-osmolality and hyponatremia: Secondary | ICD-10-CM

## 2012-03-12 DIAGNOSIS — R05 Cough: Principal | ICD-10-CM

## 2012-03-12 DIAGNOSIS — S32509A Unspecified fracture of unspecified pubis, initial encounter for closed fracture: Secondary | ICD-10-CM

## 2012-03-12 DIAGNOSIS — M311 Thrombotic microangiopathy: Secondary | ICD-10-CM

## 2012-03-12 DIAGNOSIS — R609 Edema, unspecified: Secondary | ICD-10-CM

## 2012-03-12 DIAGNOSIS — R112 Nausea with vomiting, unspecified: Secondary | ICD-10-CM

## 2012-03-12 LAB — BASIC METABOLIC PANEL
BUN: 45 mg/dL — ABNORMAL HIGH (ref 6–23)
CO2: 22 mEq/L (ref 19–32)
Chloride: 101 mEq/L (ref 96–112)
GFR calc non Af Amer: 30 mL/min — ABNORMAL LOW (ref >90)
Glucose, Bld: 135 mg/dL — ABNORMAL HIGH (ref 70–99)
Potassium: 4 mEq/L (ref 3.5–5.1)
Sodium: 134 mEq/L — ABNORMAL LOW (ref 135–145)

## 2012-03-12 LAB — CBC
Hemoglobin: 8.8 g/dL — ABNORMAL LOW (ref 12.0–15.0)
MCH: 32.6 pg (ref 26.0–34.0)
Platelets: 82 10*3/uL — ABNORMAL LOW (ref 150–400)
RBC: 2.7 MIL/uL — ABNORMAL LOW (ref 3.87–5.11)
WBC: 11.5 10*3/uL — ABNORMAL HIGH (ref 4.0–10.5)

## 2012-03-12 LAB — DIFFERENTIAL
Lymphocytes Relative: 2 % — ABNORMAL LOW (ref 12–46)
Lymphs Abs: 0.3 10*3/uL — ABNORMAL LOW (ref 0.7–4.0)
Monocytes Relative: 10 % (ref 3–12)
Neutro Abs: 10 10*3/uL — ABNORMAL HIGH (ref 1.7–7.7)
Neutrophils Relative %: 87 % — ABNORMAL HIGH (ref 43–77)

## 2012-03-12 LAB — GLUCOSE, CAPILLARY: Glucose-Capillary: 106 mg/dL — ABNORMAL HIGH (ref 70–99)

## 2012-03-12 MED ORDER — SODIUM CHLORIDE 0.9 % IJ SOLN
INTRAMUSCULAR | Status: AC
  Filled 2012-03-12: qty 3

## 2012-03-12 NOTE — Discharge Summary (Signed)
Pt dc'd to [facility] after lunch with belonging in w/c accompanied by staff via tunnel

## 2012-03-12 NOTE — Clinical Social Work Note (Signed)
Pt d/c today by MD to [facility].  Pt, pt's family, and facility aware and agreeable.  Pt to transfer with RN. D/C summary faxed.  Karn Cassis LCSW [phone]

## 2012-03-12 NOTE — Progress Notes (Signed)
Chart reviewed. Patient examined.  Stable overnight.  See Dr. Theodis Aguas Discharge Summary. CBC    Component Value Date/Time   WBC 11.5* 03/12/2012 0907   RBC 2.70* 03/12/2012 0907   HGB 8.8* 03/12/2012 0907   HCT 27.7* 03/12/2012 0907   PLT 82* 03/12/2012 0907   MCV 102.6* 03/12/2012 0907   MCH 32.6 03/12/2012 0907   MCHC 31.8 03/12/2012 0907   RDW 15.0 03/12/2012 0907   LYMPHSABS 0.3* 03/12/2012 0907   MONOABS 1.1* 03/12/2012 0907   EOSABS 0.0 03/12/2012 0907   BASOSABS 0.0 03/12/2012 0907   BMET    Component Value Date/Time   NA 134* 03/12/2012 0907   K 4.0 03/12/2012 0907   CL 101 03/12/2012 0907   CO2 22 03/12/2012 0907   GLUCOSE 135* 03/12/2012 0907   BUN 45* 03/12/2012 0907   CREATININE 1.63* 03/12/2012 0907   CREATININE 2.22* 10/22/2011 1000   CALCIUM 9.3 03/12/2012 0907   GFRNONAA 30* 03/12/2012 0907   GFRAA 34* 03/12/2012 6387

## 2012-03-12 NOTE — Clinical Social Work Placement (Signed)
Clinical Social Work Department CLINICAL SOCIAL WORK PLACEMENT NOTE 03/12/2012  Patient:  [name], [name]  Account Number:  [id number] Admit date:  03/08/2012  Clinical Social Worker:  Sherrlyn Hock  Date/time:  03/10/2012 02:30 PM  Clinical Social Work is seeking post-discharge placement for this patient at the following level of care:   SKILLED NURSING   (*CSW will update this form in Epic as items are completed)   03/10/2012  Patient/family provided with [facility] Department of Clinical Social Work's list of facilities offering this level of care within the geographic area requested by the patient (or if unable, by the patient's family).  03/10/2012  Patient/family informed of their freedom to choose among providers that offer the needed level of care, that participate in Medicare, Medicaid or managed care program needed by the patient, have an available bed and are willing to accept the patient.  03/10/2012  Patient/family informed of [facility]' ownership interest in [facility], as well as of the fact that they are under no obligation to receive care at this facility.  PASARR submitted to [employer] on  PASARR number received from [employer] on   FL2 transmitted to all facilities in geographic area requested by pt/family on  03/10/2012 FL2 transmitted to all facilities within larger geographic area on   Patient informed that his/her managed care company has contracts with or will negotiate with  certain facilities, including the following:     Patient/family informed of bed offers received:  03/11/2012 Patient chooses bed at [facility] Physician recommends and patient chooses bed at  [facility]  Patient to be transferred to [facility] on  03/12/2012 Patient to be transferred to facility by RN  The following physician request were entered in Epic:   Additional Comments: Previous pasarr number.  Karn Cassis LCSW [phone]

## 2012-03-13 ENCOUNTER — Encounter: Payer: Self-pay | Admitting: Oncology

## 2012-03-13 LAB — GLUCOSE, CAPILLARY

## 2012-03-14 LAB — GLUCOSE, CAPILLARY: Glucose-Capillary: 182 mg/dL — ABNORMAL HIGH (ref 70–99)

## 2012-03-15 LAB — GLUCOSE, CAPILLARY: Glucose-Capillary: 122 mg/dL — ABNORMAL HIGH (ref 70–99)

## 2012-03-18 ENCOUNTER — Encounter (HOSPITAL_BASED_OUTPATIENT_CLINIC_OR_DEPARTMENT_OTHER): Payer: Medicare Other

## 2012-03-18 VITALS — BP 101/67 | HR 97 | Temp 98.6°F

## 2012-03-18 DIAGNOSIS — D649 Anemia, unspecified: Secondary | ICD-10-CM

## 2012-03-18 DIAGNOSIS — E538 Deficiency of other specified B group vitamins: Secondary | ICD-10-CM

## 2012-03-18 DIAGNOSIS — D510 Vitamin B12 deficiency anemia due to intrinsic factor deficiency: Secondary | ICD-10-CM

## 2012-03-18 DIAGNOSIS — C9000 Multiple myeloma not having achieved remission: Secondary | ICD-10-CM

## 2012-03-18 LAB — COMPREHENSIVE METABOLIC PANEL
AST: 12 U/L (ref 0–37)
Albumin: 3.3 g/dL — ABNORMAL LOW (ref 3.5–5.2)
BUN: 53 mg/dL — ABNORMAL HIGH (ref 6–23)
Calcium: 9.3 mg/dL (ref 8.4–10.5)
Chloride: 100 mEq/L (ref 96–112)
Creatinine, Ser: 1.57 mg/dL — ABNORMAL HIGH (ref 0.50–1.10)
Total Bilirubin: 0.4 mg/dL (ref 0.3–1.2)

## 2012-03-18 LAB — DIFFERENTIAL
Basophils Absolute: 0 10*3/uL (ref 0.0–0.1)
Basophils Relative: 0 % (ref 0–1)
Eosinophils Absolute: 0.1 10*3/uL (ref 0.0–0.7)
Eosinophils Relative: 0 % (ref 0–5)
Monocytes Absolute: 0.3 10*3/uL (ref 0.1–1.0)
Monocytes Relative: 2 % — ABNORMAL LOW (ref 3–12)
Neutro Abs: 11.1 10*3/uL — ABNORMAL HIGH (ref 1.7–7.7)

## 2012-03-18 LAB — CBC
HCT: 28.5 % — ABNORMAL LOW (ref 36.0–46.0)
Hemoglobin: 9.3 g/dL — ABNORMAL LOW (ref 12.0–15.0)
MCH: 33.2 pg (ref 26.0–34.0)
MCHC: 32.6 g/dL (ref 30.0–36.0)
MCV: 101.8 fL — ABNORMAL HIGH (ref 78.0–100.0)
RDW: 16.2 % — ABNORMAL HIGH (ref 11.5–15.5)

## 2012-03-18 LAB — GLUCOSE, CAPILLARY: Glucose-Capillary: 139 mg/dL — ABNORMAL HIGH (ref 70–99)

## 2012-03-18 MED ORDER — SODIUM CHLORIDE 0.9 % IV SOLN
Freq: Once | INTRAVENOUS | Status: AC
  Administered 2012-03-18: 11:00:00 via INTRAVENOUS

## 2012-03-18 MED ORDER — SODIUM CHLORIDE 0.9 % IJ SOLN
INTRAMUSCULAR | Status: AC
  Filled 2012-03-18: qty 10

## 2012-03-18 MED ORDER — SODIUM CHLORIDE 0.9 % IJ SOLN
10.0000 mL | INTRAMUSCULAR | Status: DC | PRN
  Administered 2012-03-18: 10 mL
  Filled 2012-03-18: qty 10

## 2012-03-18 MED ORDER — HEPARIN SOD (PORK) LOCK FLUSH 100 UNIT/ML IV SOLN
INTRAVENOUS | Status: AC
  Filled 2012-03-18: qty 5

## 2012-03-18 MED ORDER — SODIUM CHLORIDE 0.9 % IJ SOLN
3.0000 mL | Freq: Once | INTRAMUSCULAR | Status: DC | PRN
  Filled 2012-03-18: qty 10

## 2012-03-18 MED ORDER — HEPARIN SOD (PORK) LOCK FLUSH 100 UNIT/ML IV SOLN
500.0000 [IU] | Freq: Once | INTRAVENOUS | Status: AC | PRN
  Administered 2012-03-18: 500 [IU]
  Filled 2012-03-18: qty 5

## 2012-03-18 MED ORDER — EPOETIN ALFA 40000 UNIT/ML IJ SOLN
INTRAMUSCULAR | Status: AC
  Filled 2012-03-18: qty 1

## 2012-03-18 MED ORDER — CYANOCOBALAMIN 1000 MCG/ML IJ SOLN
1000.0000 ug | Freq: Once | INTRAMUSCULAR | Status: AC
  Administered 2012-03-18: 1000 ug via INTRAMUSCULAR

## 2012-03-18 MED ORDER — EPOETIN ALFA 40000 UNIT/ML IJ SOLN
40000.0000 [IU] | Freq: Once | INTRAMUSCULAR | Status: AC
  Administered 2012-03-18: 40000 [IU] via SUBCUTANEOUS

## 2012-03-18 MED ORDER — ZOLEDRONIC ACID 4 MG/5ML IV CONC
2.0000 mg | Freq: Once | INTRAVENOUS | Status: AC
  Administered 2012-03-18: 2 mg via INTRAVENOUS
  Filled 2012-03-18: qty 2.5

## 2012-03-18 MED ORDER — CYANOCOBALAMIN 1000 MCG/ML IJ SOLN
INTRAMUSCULAR | Status: AC
  Filled 2012-03-18: qty 1

## 2012-03-19 ENCOUNTER — Telehealth (HOSPITAL_COMMUNITY): Payer: Self-pay

## 2012-03-19 LAB — KAPPA/LAMBDA LIGHT CHAINS: Lambda free light chains: 3.76 mg/dL — ABNORMAL HIGH (ref 0.57–2.63)

## 2012-03-19 NOTE — Telephone Encounter (Signed)
Call from daughter, [name].  States "mom has decided she does not want to take anymore chemotherapy and just wanted to let Dr. Mariel Sleet know."  Appointment for chemotherapy on 6/25 cancelled per request of daughter.

## 2012-03-20 LAB — MULTIPLE MYELOMA PANEL, SERUM
Albumin ELP: 56.7 % (ref 55.8–66.1)
Alpha-1-Globulin: 7.3 % — ABNORMAL HIGH (ref 2.9–4.9)
Beta Globulin: 6.1 % (ref 4.7–7.2)
IgA: 112 mg/dL (ref 69–380)
IgM, Serum: 51 mg/dL — ABNORMAL LOW (ref 52–322)
Total Protein: 5.7 g/dL — ABNORMAL LOW (ref 6.0–8.3)

## 2012-03-24 ENCOUNTER — Other Ambulatory Visit (HOSPITAL_COMMUNITY): Payer: Self-pay | Admitting: Oncology

## 2012-03-27 ENCOUNTER — Inpatient Hospital Stay (HOSPITAL_COMMUNITY): Payer: Medicare Other

## 2012-04-01 ENCOUNTER — Ambulatory Visit (HOSPITAL_COMMUNITY)
Admit: 2012-04-01 | Discharge: 2012-04-01 | Disposition: A | Discharge status: Skilled Nursing Facility | Payer: Medicare Other | Source: Skilled Nursing Facility | Attending: Internal Medicine | Admitting: Internal Medicine

## 2012-04-01 DIAGNOSIS — R059 Cough, unspecified: Secondary | ICD-10-CM | POA: Insufficient documentation

## 2012-04-01 DIAGNOSIS — R05 Cough: Secondary | ICD-10-CM | POA: Insufficient documentation

## 2012-04-07 ENCOUNTER — Ambulatory Visit (HOSPITAL_COMMUNITY): Payer: Medicare Other | Attending: Internal Medicine

## 2012-04-07 DIAGNOSIS — M7989 Other specified soft tissue disorders: Secondary | ICD-10-CM | POA: Insufficient documentation

## 2012-04-07 DIAGNOSIS — M79609 Pain in unspecified limb: Secondary | ICD-10-CM | POA: Insufficient documentation

## 2012-04-09 ENCOUNTER — Other Ambulatory Visit (HOSPITAL_COMMUNITY): Payer: Self-pay | Admitting: Oncology

## 2012-04-15 ENCOUNTER — Encounter (HOSPITAL_COMMUNITY): Payer: Medicare Other | Attending: Oncology | Admitting: Oncology

## 2012-04-15 ENCOUNTER — Inpatient Hospital Stay (HOSPITAL_COMMUNITY): Payer: Medicare Other

## 2012-04-15 VITALS — BP 117/70 | HR 84 | Temp 97.8°F | Wt 208.0 lb

## 2012-04-15 DIAGNOSIS — J209 Acute bronchitis, unspecified: Secondary | ICD-10-CM

## 2012-04-15 DIAGNOSIS — J984 Other disorders of lung: Secondary | ICD-10-CM

## 2012-04-15 DIAGNOSIS — D801 Nonfamilial hypogammaglobulinemia: Secondary | ICD-10-CM

## 2012-04-15 DIAGNOSIS — D649 Anemia, unspecified: Secondary | ICD-10-CM

## 2012-04-15 DIAGNOSIS — C9000 Multiple myeloma not having achieved remission: Secondary | ICD-10-CM

## 2012-04-15 DIAGNOSIS — R110 Nausea: Secondary | ICD-10-CM | POA: Insufficient documentation

## 2012-04-15 MED ORDER — IMMUNE GLOBULIN (HUMAN) 10 GM/100ML IV SOLN
1.0000 g/kg | Freq: Once | INTRAVENOUS | Status: DC
  Filled 2012-04-15: qty 950

## 2012-04-15 NOTE — Progress Notes (Signed)
Per Dr. Mariel Sleet we will d/c procrit.

## 2012-04-15 NOTE — Progress Notes (Signed)
Problem #1 recurrent multiple myeloma unfortunately with fractures of her pubic rami on the left presently at the [facility] for rehabilitation in the hopes that she can get home. She has however decided not to take further chemotherapy. She still needs supportive therapy.  Problem #2 chronic lower extremity edema which is best responded in the past 2 spironolactone 25-50 mg twice a day to 3 times a day and Lasix 20 mg once a day to 3 times a week.  Problem #3 hypogammaglobulinemia secondary to her myeloma and she has now another episode of acute bronchitis within the last 3 weeks on her second antibiotic and it is time to give her IVIG to get better.  Problem #4 symptomatic anemia with a hemoglobin of 7.8 g and we will transfuse her with 2 units of packed cells this week.  Problem #5 is osteoporosis with bone damage also for her myeloma and we will continue Zometa once a month to once every 6 weeks.  [name] is accompanied by her daughter who states that she has had 2 pulmonary infections down the last 3 weeks. She is on second antibiotic which is Augmentin and the previous drug was doxycycline. She has not shakiness completely. She has some rales at both bases which do not clear with coughing. Her heart is not reveal an S3 gallop however this time. She is somewhat pale and has 1+ to 2+ edema in the left leg on 1+ edema the right leg. Her left leg is always been the issue. They check her for DVT last week which was negative. The scalp lesion where she presented is still excavated. We discussed the and her goal is to get home and therefore we will try to help her do that with support from IVIG and transfusions of red cells. I will see her the end of July. I hope that Dr. Leanord Hawking will adjust her spironolactone and Lasix therapy since the spironolactone as always work better with the low-dose Lasix rather than higher doses of Lasix.  We we rediscussed her decision to stop chemotherapy she is very comfortable  with that but she still wants supportive care. I have stopped the Procrit since I'm not sure with relapsing myeloma the will it will work that well and so we will transfuse her as needed

## 2012-04-16 ENCOUNTER — Encounter (HOSPITAL_BASED_OUTPATIENT_CLINIC_OR_DEPARTMENT_OTHER): Payer: Medicare Other

## 2012-04-16 ENCOUNTER — Telehealth (HOSPITAL_COMMUNITY): Payer: Self-pay | Admitting: *Deleted

## 2012-04-16 VITALS — BP 109/66 | HR 82 | Temp 98.0°F

## 2012-04-16 DIAGNOSIS — D649 Anemia, unspecified: Secondary | ICD-10-CM

## 2012-04-16 DIAGNOSIS — D801 Nonfamilial hypogammaglobulinemia: Secondary | ICD-10-CM

## 2012-04-16 DIAGNOSIS — C9000 Multiple myeloma not having achieved remission: Secondary | ICD-10-CM

## 2012-04-16 LAB — PREPARE RBC (CROSSMATCH)

## 2012-04-16 MED ORDER — IMMUNE GLOBULIN (HUMAN) 10 GM/100ML IV SOLN
85.0000 g | Freq: Once | INTRAVENOUS | Status: DC
  Filled 2012-04-16: qty 850

## 2012-04-16 MED ORDER — HEPARIN SOD (PORK) LOCK FLUSH 100 UNIT/ML IV SOLN
500.0000 [IU] | Freq: Once | INTRAVENOUS | Status: AC
  Administered 2012-04-16: 500 [IU] via INTRAVENOUS
  Filled 2012-04-16: qty 5

## 2012-04-16 MED ORDER — HEPARIN SOD (PORK) LOCK FLUSH 100 UNIT/ML IV SOLN
INTRAVENOUS | Status: AC
  Filled 2012-04-16: qty 5

## 2012-04-16 MED ORDER — IMMUNE GLOBULIN (HUMAN) 10 GM/100ML IV SOLN: 1.0000 g/kg | Freq: Once | INTRAVENOUS | Status: DC

## 2012-04-16 MED ORDER — IMMUNE GLOBULIN (HUMAN) 10 GM/100ML IV SOLN
10.0000 g | Freq: Once | INTRAVENOUS | Status: AC
  Administered 2012-04-16: 0.1 g via INTRAVENOUS
  Filled 2012-04-16: qty 100

## 2012-04-16 NOTE — Telephone Encounter (Signed)
[name]'s husband notified to let her know that Dr. Mariel Sleet wants [name] to see heart dr. Once more.

## 2012-04-16 NOTE — Telephone Encounter (Signed)
[name] wants to know if we can cancel the 2 d echo and heart doctor appt.

## 2012-04-17 ENCOUNTER — Other Ambulatory Visit: Payer: Self-pay

## 2012-04-17 ENCOUNTER — Inpatient Hospital Stay (HOSPITAL_COMMUNITY)
Admission: EM | Admit: 2012-04-17 | Discharge: 2012-04-19 | DRG: 194 | Disposition: A | Discharge status: Skilled Nursing Facility | Payer: Medicare Other | Attending: Internal Medicine | Admitting: Internal Medicine

## 2012-04-17 ENCOUNTER — Encounter (HOSPITAL_COMMUNITY): Payer: Self-pay | Admitting: *Deleted

## 2012-04-17 ENCOUNTER — Emergency Department (HOSPITAL_COMMUNITY): Payer: Medicare Other

## 2012-04-17 ENCOUNTER — Encounter (HOSPITAL_BASED_OUTPATIENT_CLINIC_OR_DEPARTMENT_OTHER): Payer: Medicare Other

## 2012-04-17 VITALS — BP 139/79 | HR 102 | Temp 100.5°F

## 2012-04-17 DIAGNOSIS — D630 Anemia in neoplastic disease: Secondary | ICD-10-CM | POA: Diagnosis present

## 2012-04-17 DIAGNOSIS — Z66 Do not resuscitate: Secondary | ICD-10-CM | POA: Diagnosis present

## 2012-04-17 DIAGNOSIS — G8929 Other chronic pain: Secondary | ICD-10-CM

## 2012-04-17 DIAGNOSIS — E876 Hypokalemia: Secondary | ICD-10-CM

## 2012-04-17 DIAGNOSIS — R609 Edema, unspecified: Secondary | ICD-10-CM

## 2012-04-17 DIAGNOSIS — G609 Hereditary and idiopathic neuropathy, unspecified: Secondary | ICD-10-CM | POA: Diagnosis present

## 2012-04-17 DIAGNOSIS — E669 Obesity, unspecified: Secondary | ICD-10-CM

## 2012-04-17 DIAGNOSIS — J189 Pneumonia, unspecified organism: Principal | ICD-10-CM | POA: Diagnosis present

## 2012-04-17 DIAGNOSIS — R5381 Other malaise: Secondary | ICD-10-CM

## 2012-04-17 DIAGNOSIS — C9000 Multiple myeloma not having achieved remission: Secondary | ICD-10-CM

## 2012-04-17 DIAGNOSIS — R509 Fever, unspecified: Secondary | ICD-10-CM | POA: Diagnosis present

## 2012-04-17 DIAGNOSIS — I129 Hypertensive chronic kidney disease with stage 1 through stage 4 chronic kidney disease, or unspecified chronic kidney disease: Secondary | ICD-10-CM | POA: Diagnosis present

## 2012-04-17 DIAGNOSIS — Z79899 Other long term (current) drug therapy: Secondary | ICD-10-CM

## 2012-04-17 DIAGNOSIS — IMO0002 Reserved for concepts with insufficient information to code with codable children: Secondary | ICD-10-CM

## 2012-04-17 DIAGNOSIS — N289 Disorder of kidney and ureter, unspecified: Secondary | ICD-10-CM | POA: Insufficient documentation

## 2012-04-17 DIAGNOSIS — I10 Essential (primary) hypertension: Secondary | ICD-10-CM | POA: Diagnosis present

## 2012-04-17 DIAGNOSIS — J441 Chronic obstructive pulmonary disease with (acute) exacerbation: Secondary | ICD-10-CM | POA: Diagnosis present

## 2012-04-17 DIAGNOSIS — G629 Polyneuropathy, unspecified: Secondary | ICD-10-CM

## 2012-04-17 DIAGNOSIS — B372 Candidiasis of skin and nail: Secondary | ICD-10-CM

## 2012-04-17 DIAGNOSIS — N182 Chronic kidney disease, stage 2 (mild): Secondary | ICD-10-CM | POA: Diagnosis present

## 2012-04-17 DIAGNOSIS — Z9221 Personal history of antineoplastic chemotherapy: Secondary | ICD-10-CM

## 2012-04-17 DIAGNOSIS — R112 Nausea with vomiting, unspecified: Secondary | ICD-10-CM

## 2012-04-17 DIAGNOSIS — J439 Emphysema, unspecified: Secondary | ICD-10-CM | POA: Diagnosis present

## 2012-04-17 DIAGNOSIS — E538 Deficiency of other specified B group vitamins: Secondary | ICD-10-CM | POA: Diagnosis present

## 2012-04-17 DIAGNOSIS — Z8781 Personal history of (healed) traumatic fracture: Secondary | ICD-10-CM

## 2012-04-17 DIAGNOSIS — D801 Nonfamilial hypogammaglobulinemia: Secondary | ICD-10-CM

## 2012-04-17 DIAGNOSIS — D696 Thrombocytopenia, unspecified: Secondary | ICD-10-CM | POA: Diagnosis present

## 2012-04-17 DIAGNOSIS — I429 Cardiomyopathy, unspecified: Secondary | ICD-10-CM

## 2012-04-17 DIAGNOSIS — M810 Age-related osteoporosis without current pathological fracture: Secondary | ICD-10-CM | POA: Diagnosis present

## 2012-04-17 DIAGNOSIS — T50905A Adverse effect of unspecified drugs, medicaments and biological substances, initial encounter: Secondary | ICD-10-CM

## 2012-04-17 DIAGNOSIS — D510 Vitamin B12 deficiency anemia due to intrinsic factor deficiency: Secondary | ICD-10-CM

## 2012-04-17 DIAGNOSIS — K219 Gastro-esophageal reflux disease without esophagitis: Secondary | ICD-10-CM | POA: Diagnosis present

## 2012-04-17 DIAGNOSIS — S0083XA Contusion of other part of head, initial encounter: Secondary | ICD-10-CM

## 2012-04-17 DIAGNOSIS — R110 Nausea: Secondary | ICD-10-CM

## 2012-04-17 DIAGNOSIS — Z9079 Acquired absence of other genital organ(s): Secondary | ICD-10-CM

## 2012-04-17 DIAGNOSIS — S32599A Other specified fracture of unspecified pubis, initial encounter for closed fracture: Secondary | ICD-10-CM

## 2012-04-17 DIAGNOSIS — Z90710 Acquired absence of both cervix and uterus: Secondary | ICD-10-CM

## 2012-04-17 DIAGNOSIS — Z9181 History of falling: Secondary | ICD-10-CM

## 2012-04-17 DIAGNOSIS — D649 Anemia, unspecified: Secondary | ICD-10-CM | POA: Diagnosis present

## 2012-04-17 DIAGNOSIS — J209 Acute bronchitis, unspecified: Secondary | ICD-10-CM

## 2012-04-17 DIAGNOSIS — Z6839 Body mass index (BMI) 39.0-39.9, adult: Secondary | ICD-10-CM

## 2012-04-17 DIAGNOSIS — R001 Bradycardia, unspecified: Secondary | ICD-10-CM

## 2012-04-17 DIAGNOSIS — J9801 Acute bronchospasm: Secondary | ICD-10-CM

## 2012-04-17 DIAGNOSIS — E871 Hypo-osmolality and hyponatremia: Secondary | ICD-10-CM | POA: Diagnosis present

## 2012-04-17 HISTORY — DX: Emphysema, unspecified: J43.9

## 2012-04-17 LAB — CBC WITH DIFFERENTIAL/PLATELET
Basophils Absolute: 0 10*3/uL (ref 0.0–0.1)
Eosinophils Absolute: 0.1 10*3/uL (ref 0.0–0.7)
Lymphocytes Relative: 4 % — ABNORMAL LOW (ref 12–46)
MCH: 32.3 pg (ref 26.0–34.0)
MCHC: 31.9 g/dL (ref 30.0–36.0)
Monocytes Absolute: 0.4 10*3/uL (ref 0.1–1.0)
Neutrophils Relative %: 90 % — ABNORMAL HIGH (ref 43–77)
Platelets: 167 10*3/uL (ref 150–400)

## 2012-04-17 LAB — URINALYSIS, ROUTINE W REFLEX MICROSCOPIC
Bilirubin Urine: NEGATIVE
Ketones, ur: NEGATIVE mg/dL
Nitrite: NEGATIVE
Protein, ur: NEGATIVE mg/dL
Urobilinogen, UA: 0.2 mg/dL (ref 0.0–1.0)
pH: 6 (ref 5.0–8.0)

## 2012-04-17 LAB — BASIC METABOLIC PANEL
BUN: 27 mg/dL — ABNORMAL HIGH (ref 6–23)
Calcium: 9.2 mg/dL (ref 8.4–10.5)
Creatinine, Ser: 1.45 mg/dL — ABNORMAL HIGH (ref 0.50–1.10)
GFR calc Af Amer: 39 mL/min — ABNORMAL LOW (ref >90)
GFR calc non Af Amer: 34 mL/min — ABNORMAL LOW (ref >90)

## 2012-04-17 LAB — URINE MICROSCOPIC-ADD ON

## 2012-04-17 MED ORDER — ENOXAPARIN SODIUM 40 MG/0.4ML ~~LOC~~ SOLN
40.0000 mg | SUBCUTANEOUS | Status: DC
  Administered 2012-04-17 – 2012-04-18 (×2): 40 mg via SUBCUTANEOUS
  Filled 2012-04-17 (×2): qty 0.4

## 2012-04-17 MED ORDER — ACETAMINOPHEN 325 MG PO TABS
ORAL_TABLET | ORAL | Status: AC
  Filled 2012-04-17: qty 2

## 2012-04-17 MED ORDER — IPRATROPIUM BROMIDE 0.02 % IN SOLN
0.5000 mg | Freq: Once | RESPIRATORY_TRACT | Status: AC
  Administered 2012-04-17: 0.5 mg via RESPIRATORY_TRACT
  Filled 2012-04-17: qty 2.5

## 2012-04-17 MED ORDER — PIPERACILLIN-TAZOBACTAM 3.375 G IVPB
3.3750 g | Freq: Once | INTRAVENOUS | Status: AC
  Administered 2012-04-17: 3.375 g via INTRAVENOUS
  Filled 2012-04-17: qty 50

## 2012-04-17 MED ORDER — LEVOFLOXACIN IN D5W 750 MG/150ML IV SOLN
INTRAVENOUS | Status: AC
  Filled 2012-04-17: qty 150

## 2012-04-17 MED ORDER — ONDANSETRON HCL 4 MG PO TABS
8.0000 mg | ORAL_TABLET | Freq: Once | ORAL | Status: AC
  Administered 2012-04-17: 8 mg via ORAL

## 2012-04-17 MED ORDER — PANTOPRAZOLE SODIUM 40 MG PO TBEC
40.0000 mg | DELAYED_RELEASE_TABLET | Freq: Every day | ORAL | Status: DC
  Administered 2012-04-17 – 2012-04-18 (×2): 40 mg via ORAL
  Filled 2012-04-17 (×3): qty 1

## 2012-04-17 MED ORDER — IMMUNE GLOBULIN (HUMAN) 10 GM/100ML IV SOLN
10.0000 g | Freq: Once | INTRAVENOUS | Status: AC
  Administered 2012-04-17: 10 g via INTRAVENOUS
  Filled 2012-04-17: qty 100

## 2012-04-17 MED ORDER — ONDANSETRON HCL 4 MG/2ML IJ SOLN: 4.0000 mg | Freq: Three times a day (TID) | INTRAMUSCULAR | Status: DC | PRN

## 2012-04-17 MED ORDER — DEXTROSE 5 % IV SOLN
INTRAVENOUS | Status: AC
  Filled 2012-04-17: qty 1

## 2012-04-17 MED ORDER — DEXTROSE 5 % IV SOLN
1.0000 g | Freq: Three times a day (TID) | INTRAVENOUS | Status: DC
  Filled 2012-04-17 (×2): qty 1

## 2012-04-17 MED ORDER — LEVOFLOXACIN IN D5W 750 MG/150ML IV SOLN
750.0000 mg | INTRAVENOUS | Status: DC
  Administered 2012-04-18: 750 mg via INTRAVENOUS
  Filled 2012-04-17 (×2): qty 150

## 2012-04-17 MED ORDER — POTASSIUM CHLORIDE CRYS ER 20 MEQ PO TBCR
20.0000 meq | EXTENDED_RELEASE_TABLET | ORAL | Status: DC
  Administered 2012-04-18: 20 meq via ORAL
  Filled 2012-04-17: qty 1

## 2012-04-17 MED ORDER — VANCOMYCIN HCL IN DEXTROSE 1-5 GM/200ML-% IV SOLN
1000.0000 mg | INTRAVENOUS | Status: DC
  Administered 2012-04-17 – 2012-04-18 (×2): 1000 mg via INTRAVENOUS
  Filled 2012-04-17 (×4): qty 200

## 2012-04-17 MED ORDER — FOLIC ACID 1 MG PO TABS
1.0000 mg | ORAL_TABLET | Freq: Every day | ORAL | Status: DC
  Administered 2012-04-18 – 2012-04-19 (×2): 1 mg via ORAL
  Filled 2012-04-17 (×2): qty 1

## 2012-04-17 MED ORDER — GUAIFENESIN ER 600 MG PO TB12
1200.0000 mg | ORAL_TABLET | Freq: Two times a day (BID) | ORAL | Status: DC
  Administered 2012-04-17 – 2012-04-19 (×4): 1200 mg via ORAL
  Filled 2012-04-17 (×4): qty 2

## 2012-04-17 MED ORDER — ONDANSETRON HCL 4 MG/2ML IJ SOLN: 4.0000 mg | INTRAMUSCULAR | Status: DC | PRN

## 2012-04-17 MED ORDER — OXYCODONE HCL 10 MG PO TB12
10.0000 mg | ORAL_TABLET | Freq: Two times a day (BID) | ORAL | Status: DC
  Administered 2012-04-17 – 2012-04-19 (×4): 10 mg via ORAL
  Filled 2012-04-17 (×4): qty 1

## 2012-04-17 MED ORDER — DOCUSATE SODIUM 100 MG PO CAPS
100.0000 mg | ORAL_CAPSULE | ORAL | Status: DC
  Administered 2012-04-18: 100 mg via ORAL
  Filled 2012-04-17: qty 1

## 2012-04-17 MED ORDER — SODIUM CHLORIDE 0.9 % IV SOLN: INTRAVENOUS | Status: DC

## 2012-04-17 MED ORDER — DIPHENHYDRAMINE HCL 25 MG PO CAPS
ORAL_CAPSULE | ORAL | Status: AC
  Filled 2012-04-17: qty 2

## 2012-04-17 MED ORDER — SODIUM CHLORIDE 0.9 % IJ SOLN
10.0000 mL | INTRAMUSCULAR | Status: DC | PRN
  Administered 2012-04-17: 10 mL via INTRAVENOUS
  Filled 2012-04-17: qty 10

## 2012-04-17 MED ORDER — IBUPROFEN 400 MG PO TABS
400.0000 mg | ORAL_TABLET | Freq: Once | ORAL | Status: AC
  Administered 2012-04-17: 400 mg via ORAL
  Filled 2012-04-17: qty 1

## 2012-04-17 MED ORDER — HEPARIN SOD (PORK) LOCK FLUSH 100 UNIT/ML IV SOLN
INTRAVENOUS | Status: AC
  Filled 2012-04-17: qty 5

## 2012-04-17 MED ORDER — AMLODIPINE BESYLATE 5 MG PO TABS
5.0000 mg | ORAL_TABLET | Freq: Every day | ORAL | Status: DC
  Administered 2012-04-18: 5 mg via ORAL
  Filled 2012-04-17 (×2): qty 1

## 2012-04-17 MED ORDER — SPIRONOLACTONE 25 MG PO TABS
25.0000 mg | ORAL_TABLET | Freq: Every day | ORAL | Status: DC
  Administered 2012-04-18 – 2012-04-19 (×2): 25 mg via ORAL
  Filled 2012-04-17 (×2): qty 1

## 2012-04-17 MED ORDER — ALBUTEROL SULFATE (5 MG/ML) 0.5% IN NEBU
2.5000 mg | INHALATION_SOLUTION | Freq: Once | RESPIRATORY_TRACT | Status: AC
  Administered 2012-04-17: 2.5 mg via RESPIRATORY_TRACT
  Filled 2012-04-17: qty 0.5

## 2012-04-17 MED ORDER — LEVOFLOXACIN IN D5W 750 MG/150ML IV SOLN
750.0000 mg | INTRAVENOUS | Status: DC
  Filled 2012-04-17: qty 150

## 2012-04-17 MED ORDER — ONDANSETRON HCL 4 MG PO TABS
ORAL_TABLET | ORAL | Status: AC
  Filled 2012-04-17: qty 2

## 2012-04-17 MED ORDER — SODIUM CHLORIDE 0.9 % IJ SOLN
INTRAMUSCULAR | Status: AC
  Filled 2012-04-17: qty 10

## 2012-04-17 MED ORDER — POTASSIUM CHLORIDE IN NACL 20-0.9 MEQ/L-% IV SOLN
INTRAVENOUS | Status: DC
  Administered 2012-04-17: 23:00:00 via INTRAVENOUS

## 2012-04-17 MED ORDER — SODIUM CHLORIDE 0.9 % IV BOLUS (SEPSIS)
1000.0000 mL | Freq: Once | INTRAVENOUS | Status: AC
  Administered 2012-04-17: 1000 mL via INTRAVENOUS

## 2012-04-17 MED ORDER — VANCOMYCIN HCL IN DEXTROSE 1-5 GM/200ML-% IV SOLN
INTRAVENOUS | Status: AC
  Filled 2012-04-17: qty 200

## 2012-04-17 MED ORDER — HYDROCORTISONE SOD SUCCINATE 100 MG IJ SOLR
INTRAMUSCULAR | Status: AC
  Filled 2012-04-17: qty 2

## 2012-04-17 MED ORDER — FUROSEMIDE 20 MG PO TABS: 20.0000 mg | ORAL_TABLET | Freq: Every day | ORAL | Status: DC

## 2012-04-17 MED ORDER — IMMUNE GLOBULIN (HUMAN) 10 GM/100ML IV SOLN: 1.0000 g/kg | Freq: Once | INTRAVENOUS | Status: DC

## 2012-04-17 MED ORDER — HEPARIN SOD (PORK) LOCK FLUSH 100 UNIT/ML IV SOLN
500.0000 [IU] | Freq: Once | INTRAVENOUS | Status: AC
  Administered 2012-04-17: 500 [IU] via INTRAVENOUS
  Filled 2012-04-17: qty 5

## 2012-04-17 MED ORDER — DIAZEPAM 5 MG PO TABS: 10.0000 mg | ORAL_TABLET | Freq: Every evening | ORAL | Status: DC | PRN

## 2012-04-17 MED ORDER — ALBUTEROL SULFATE (5 MG/ML) 0.5% IN NEBU
2.5000 mg | INHALATION_SOLUTION | RESPIRATORY_TRACT | Status: DC
  Administered 2012-04-18 – 2012-04-19 (×9): 2.5 mg via RESPIRATORY_TRACT
  Filled 2012-04-17 (×9): qty 0.5

## 2012-04-17 MED ORDER — ACETAMINOPHEN 325 MG PO TABS
650.0000 mg | ORAL_TABLET | ORAL | Status: DC | PRN
  Administered 2012-04-18: 650 mg via ORAL
  Filled 2012-04-17: qty 2

## 2012-04-17 MED ORDER — IMMUNE GLOBULIN (HUMAN) 10 GM/100ML IV SOLN
85.0000 g | Freq: Once | INTRAVENOUS | Status: AC
  Administered 2012-04-17: 85 g via INTRAVENOUS
  Filled 2012-04-17: qty 850

## 2012-04-17 MED ORDER — DEXTROSE 5 % IV SOLN
1.0000 g | Freq: Two times a day (BID) | INTRAVENOUS | Status: DC
  Administered 2012-04-17: 1 g via INTRAVENOUS
  Filled 2012-04-17 (×2): qty 1

## 2012-04-17 MED ORDER — VANCOMYCIN HCL IN DEXTROSE 1-5 GM/200ML-% IV SOLN: 1000.0000 mg | Freq: Once | INTRAVENOUS | Status: DC

## 2012-04-17 MED ORDER — IBUPROFEN 800 MG PO TABS: 400.0000 mg | ORAL_TABLET | ORAL | Status: DC | PRN

## 2012-04-17 MED ORDER — ALBUTEROL SULFATE (5 MG/ML) 0.5% IN NEBU: 2.5000 mg | INHALATION_SOLUTION | RESPIRATORY_TRACT | Status: DC | PRN

## 2012-04-17 MED ORDER — HYDROCORTISONE SOD SUCCINATE 100 MG IJ SOLR
50.0000 mg | Freq: Once | INTRAMUSCULAR | Status: AC
  Administered 2012-04-17: 50 mg via INTRAVENOUS

## 2012-04-17 MED ORDER — ALBUTEROL SULFATE (5 MG/ML) 0.5% IN NEBU
INHALATION_SOLUTION | RESPIRATORY_TRACT | Status: AC
  Administered 2012-04-17: 2.5 mg
  Filled 2012-04-17: qty 0.5

## 2012-04-17 NOTE — ED Notes (Signed)
Nurse at [facility] reports given Tylenol 650mg  PO at 1520 for fever and Benedryl 50mg  PO at 1550.

## 2012-04-17 NOTE — ED Notes (Signed)
Call placed to floor. Nurse not available to take report. Nurse to call back.

## 2012-04-17 NOTE — H&P (Signed)
PCP:   [name], MD   Chief Complaint:  Fever since today  HPI: [name] is an 78 y.o. female.   Caucasian lady, resident of the [facility] nursing facility with multiple medical problems  including multiple myeloma, rehabbing for pelvic fractures, and has received 2  transfusions over the past few days including IVIG today . Patient has been coughing for the past week and because she  developed a fever today and has become increasingly weak,, patient was sent to the emergency room for evaluation for respiratory tract infection and the hospitalist service was called to assist with management.  Other than fever weakness and nonproductive cough patient denies other acute problems.  She is due for further blood transfusion today or tomorrow  Rewiew of Systems:  The patient denies anorexia,  weight loss,, vision loss, decreased hearing, hoarseness, chest pain, syncope, dyspnea on exertion, peripheral edema, balance deficits, hemoptysis, abdominal pain, melena, hematochezia, severe indigestion/heartburn, hematuria, incontinence, genital sores, muscle weakness, suspicious skin lesions, transient blindness, difficulty walking, depression, unusual weight change, abnormal bleeding, enlarged lymph nodes, angioedema, and breast masses.   Past Medical History  Diagnosis Date  . Essential hypertension, benign     LVH; RVH; normal EF in 04/2011 with the basilar inferior wall akinesis  . Edema   . Obesity   . Osteoporosis     Lumbar and thoracic compression fractures  . Multiple myeloma 2007    IgA lambda  . Gastroesophageal reflux disease   . Pernicious anemia     Plus thrombocytopenia;H./H.-9.8/29 in 12/2010  . Chronic kidney disease, stage 2, mildly decreased GFR     Creatinine of 1.3 in 09/2010; acute renal failure in 11/2010 secondary to diuretics with hyperkalemia secondary to Spiriva lactone  . Peripheral neuropathy     Complicating chemotherapy  . Chronic low back pain   .  Chronic steroid use   . History of candidiasis   . Allergy     Revlimid  . Anemia of chronic disease   . B12 deficiency   . Hyperglycemia, drug-induced 03/09/2012  . Fracture Of Multiple Pubic Rami 03/08/2012  . Thrombocytopenia 03/10/2012  . Emphysema     Past Surgical History  Procedure Date  . Total abdominal hysterectomy w/ bilateral salpingoophorectomy     Incidental appendectomy  . Rotator cuff repair     Left  . Vein surgery     Ligation and stripping bilaterally  . Orif ankle fracture     Right  . Carpal tunnel release     Right  . Central venous catheter tunneled insertion single lumen 2012    Medications:  HOME MEDS: Prior to Admission medications   Medication Sig Start Date End Date Taking? Authorizing Provider  acetaminophen (TYLENOL) 325 MG tablet Take 2 tablets (650 mg total) by mouth 2 (two) times daily. 03/11/12 03/11/13  Elliot Cousin, MD  albuterol (PROVENTIL) (5 MG/ML) 0.5% nebulizer solution Take 0.5 mLs (2.5 mg total) by nebulization every 4 (four) hours. 03/11/12 03/11/13  Elliot Cousin, MD  amLODipine (NORVASC) 5 MG tablet Take 1 tablet (5 mg total) by mouth daily. 01/28/12 01/27/13  Ellouise Newer, PA  Cholecalciferol (VITAMIN D3) 1000 UNITS CAPS Take 1 capsule by mouth daily.    Historical Provider, MD  diazepam (VALIUM) 5 MG tablet Take 10 mg by mouth at bedtime as needed. For sleep/anxiety, Is taking 2 at bedtime 03/11/12   Elliot Cousin, MD  docusate sodium (COLACE) 100 MG capsule Take 100 mg by mouth every  Monday, Wednesday, and Friday. Takes in the evening    Historical Provider, MD  folic acid (FOLVITE) 1 MG tablet Take 1 mg by mouth daily.      Historical Provider, MD  furosemide (LASIX) 20 MG tablet Take 1 tablet (20 mg total) by mouth daily. 03/11/12   Elliot Cousin, MD  guaiFENesin (MUCINEX) 600 MG 12 hr tablet Take 2 tablets (1,200 mg total) by mouth 2 (two) times daily. 03/05/12 03/05/13  Erick Blinks, MD  iron polysaccharides (NU-IRON) 150 MG  capsule Take 150 mg by mouth every Monday, Wednesday, and Friday.     Historical Provider, MD  loperamide (IMODIUM A-D) 2 MG tablet Take 2 mg by mouth 4 (four) times daily as needed.    Historical Provider, MD  nystatin cream (MYCOSTATIN) Apply topically 2 (two) times daily. 03/11/12 03/11/13  Elliot Cousin, MD  omeprazole (PRILOSEC) 20 MG capsule Take 1 capsule (20 mg total) by mouth daily. 05/31/11   Ellouise Newer, PA  ondansetron (ZOFRAN) 8 MG tablet Take 1 tablet (8 mg total) by mouth every 8 (eight) hours as needed. nausea 03/11/12   Elliot Cousin, MD  oxyCODONE (OXYCONTIN) 10 MG 12 hr tablet Take 10 mg by mouth every 12 (twelve) hours.    Historical Provider, MD  potassium chloride SA (K-DUR,KLOR-CON) 20 MEQ tablet Take 20 mEq by mouth every Monday, Wednesday, and Friday.     Historical Provider, MD  predniSONE (DELTASONE) 20 MG tablet TAKE 1 TABLET DAILY WITH BREAKFAST FOR 2 MORE DAYS AND THEN A HALF A TABLET DAILY WITH BREAKFAST FOR 2 MORE DAYS AND THEN STOP. 03/11/12   Elliot Cousin, MD  spironolactone (ALDACTONE) 25 MG tablet Take 25 mg by mouth daily.    Historical Provider, MD     Allergies:  Allergies  Allergen Reactions  . Lenalidomide Rash    Social History:   reports that she has never smoked. She has never used smokeless tobacco. She reports that she does not drink alcohol or use illicit drugs.  Family History: Family History  Problem Relation Age of Onset  . Heart attack Brother      Physical Exam: Filed Vitals:   04/17/12 1654 04/17/12 1800 04/17/12 1836 04/17/12 1957  BP:   104/55 99/65  Pulse:    110  Temp:  100.4 F (38 C)  99.3 F (37.4 C)  TempSrc:  Oral  Oral  Resp:   20 20  Height:    5\' 2"  (1.575 m)  Weight:    101.3 kg (223 lb 5.2 oz)  SpO2: 96%  95% 96%   Blood pressure 99/65, pulse 110, temperature 99.3 F (37.4 C), temperature source Oral, resp. rate 20, height 5\' 2"  (1.575 m), weight 101.3 kg (223 lb 5.2 oz), SpO2 96.00%.  GEN:  Pleasant  ill-looking elderly lady lying in the bed complaining of back pain with turning; cooperative with exam PSYCH:  alert and oriented; does not appear anxious or depressed; affect is appropriate. HEENT: Mucous membranes pale  and anicteric; PERRLA; EOM intact; thick neck  Breasts:: Not examined CHEST WALL: No tenderness CHEST: Normal respiration, coarse crackles right base HEART: Regular rate and rhythm; no murmurs rubs or gallops BACK: No kyphosis or scoliosis; no CVA tenderness ABDOMEN: Obese, soft non-tender; no masses, no organomegaly, normal abdominal bowel sounds; no intertriginous candida. Rectal Exam: Not done EXTREMITIES:  age-appropriate arthropathy of the hands and knees; trace edema edema; no ulcerations. Genitalia: not examined PULSES: 2+ and symmetric SKIN: Normal hydration no rash or  ulceration CNS: Cranial nerves 2-12 grossly intact no focal lateralizing neurologic deficit   Labs & Imaging Results for orders placed during the hospital encounter of 04/17/12 (from the past 48 hour(s))  CBC WITH DIFFERENTIAL     Status: Abnormal   Collection Time   04/17/12  4:50 PM      Component Value Range Comment   WBC 7.7  4.0 - 10.5 K/uL    RBC 2.29 (*) 3.87 - 5.11 MIL/uL    Hemoglobin 7.4 (*) 12.0 - 15.0 g/dL    HCT 62.1 (*) 30.8 - 46.0 %    MCV 101.3 (*) 78.0 - 100.0 fL    MCH 32.3  26.0 - 34.0 pg    MCHC 31.9  30.0 - 36.0 g/dL    RDW 65.7 (*) 84.6 - 15.5 %    Platelets 167  150 - 400 K/uL    Neutrophils Relative 90 (*) 43 - 77 %    Lymphocytes Relative 4 (*) 12 - 46 %    Monocytes Relative 5  3 - 12 %    Eosinophils Relative 1  0 - 5 %    Basophils Relative 0  0 - 1 %    Neutro Abs 6.9  1.7 - 7.7 K/uL    Lymphs Abs 0.3 (*) 0.7 - 4.0 K/uL    Monocytes Absolute 0.4  0.1 - 1.0 K/uL    Eosinophils Absolute 0.1  0.0 - 0.7 K/uL    Basophils Absolute 0.0  0.0 - 0.1 K/uL    RBC Morphology POLYCHROMASIA PRESENT      WBC Morphology ATYPICAL MONONUCLEAR CELLS      Smear Review LARGE  PLATELETS PRESENT   GIANT PLATELETS SEEN  BASIC METABOLIC PANEL     Status: Abnormal   Collection Time   04/17/12  4:50 PM      Component Value Range Comment   Sodium 127 (*) 135 - 145 mEq/L    Potassium 4.0  3.5 - 5.1 mEq/L    Chloride 96  96 - 112 mEq/L    CO2 23  19 - 32 mEq/L    Glucose, Bld 116 (*) 70 - 99 mg/dL    BUN 27 (*) 6 - 23 mg/dL    Creatinine, Ser 9.62 (*) 0.50 - 1.10 mg/dL    Calcium 9.2  8.4 - 95.2 mg/dL    GFR calc non Af Amer 34 (*) >90 mL/min    GFR calc Af Amer 39 (*) >90 mL/min   URINALYSIS, ROUTINE W REFLEX MICROSCOPIC     Status: Abnormal   Collection Time   04/17/12  5:12 PM      Component Value Range Comment   Color, Urine STRAW (*) YELLOW    APPearance CLEAR  CLEAR    Specific Gravity, Urine 1.010  1.005 - 1.030    pH 6.0  5.0 - 8.0    Glucose, UA NEGATIVE  NEGATIVE mg/dL    Hgb urine dipstick MODERATE (*) NEGATIVE    Bilirubin Urine NEGATIVE  NEGATIVE    Ketones, ur NEGATIVE  NEGATIVE mg/dL    Protein, ur NEGATIVE  NEGATIVE mg/dL    Urobilinogen, UA 0.2  0.0 - 1.0 mg/dL    Nitrite NEGATIVE  NEGATIVE    Leukocytes, UA NEGATIVE  NEGATIVE   URINE MICROSCOPIC-ADD ON     Status: Normal   Collection Time   04/17/12  5:12 PM      Component Value Range Comment   Squamous Epithelial / LPF RARE  RARE    WBC, UA 0-2  <3 WBC/hpf    RBC / HPF 0-2  <3 RBC/hpf    Bacteria, UA RARE  RARE    Dg Chest Portable 1 View  04/17/2012  *RADIOLOGY REPORT*  Clinical Data: Fever and shortness of breath.  History of hypertension.  PORTABLE CHEST - 1 VIEW  Comparison: 04/01/2012  Findings: Cardiomegaly.   No infiltrates or failure.  No effusion or pneumothorax.   Central venous catheter tip proximal SVC unchanged from priors.  IMPRESSION: Cardiomegaly.  No active infiltrates.  Stable chest.  Original Report Authenticated By: Elsie Stain, M.D.      Assessment Present on Admission:  .Possible Pneumonia, organism unspecified . history of chronic  bronchitis .Hyponatremia .Anemia .Fever .Obesity .Essential hypertension, benign .Multiple myeloma .Chronic kidney disease, stage 2, mildly decreased GFR   PLAN: Coarse crackles at the right base of the clinical exam is very suggestive of pneumonia given the clinical picture of cough, fever and weakness in this immunocompromised lady.  We'll good broad-spectrum coverage for healthcare associated pneumonia; hydrate and transfuse; and repeat chest x-ray or plain CT scan tomorrow.   she confirms her CODE STATUS as full code, although she is considering whether she at a later stage wishes to become DNI status.   Other plans as per orders.    Dakoda Bassette 04/17/2012, 9:38 PM

## 2012-04-17 NOTE — ED Notes (Signed)
Pt resident at [facility] - family reports pt had recent infusion at [facility] yesterday and today.  Reports increased sob, decreased O2 sats, and fever that started around lunch time today.  Pt denies pain, denies sob.  Nurse at [facility] gave neb treatment and Benedryl 50mg  PO PTA.

## 2012-04-17 NOTE — ED Provider Notes (Signed)
History    This chart was scribed for [name], MD, MD by Smitty Pluck. The patient was seen in room APA19 and the patient's care was started at 4:25PM.   CSN: [id number]  Arrival date & time 04/17/12  1604   First MD Initiated Contact with Patient 04/17/12 1622      Chief Complaint  Patient presents with  . Shortness of Breath  . Fever    (Consider location/radiation/quality/duration/timing/severity/associated sxs/prior treatment) The history is provided by the patient and a relative.   [name] is a 78 y.o. female who presents to the Emergency Department BIB EMS from [facility] complaining of moderate fever and nausea onset today around lunch. Symptoms have been constant since onset. Pt reports having chills and coughing yellow phlegm. Denies chest pain, vomiting, diarrhea, dysuria, increased urinary frequency and SOB. Pt has had 2 infusions at [facility] today and 1 day ago. Pt is receiving chemotherapy for multiple myeloma (6 years) but she is stopping. Pt denies using oxygen at home.  Pt has taken tylenol without relief and neb treatment with minor relief. Pt has had frequent URI. Pt is currently taking Augmentin. She was just completed a course of doxycycline.   PCP is Dr. Regino Schultze  Past Medical History  Diagnosis Date  . Essential hypertension, benign     LVH; RVH; normal EF in 04/2011 with the basilar inferior wall akinesis  . Edema   . Obesity   . Osteoporosis     Lumbar and thoracic compression fractures  . Multiple myeloma 2007    IgA lambda  . Gastroesophageal reflux disease   . Pernicious anemia     Plus thrombocytopenia;H./H.-9.8/29 in 12/2010  . Chronic kidney disease, stage 2, mildly decreased GFR     Creatinine of 1.3 in 09/2010; acute renal failure in 11/2010 secondary to diuretics with hyperkalemia secondary to Spiriva lactone  . Peripheral neuropathy     Complicating chemotherapy  . Chronic low back pain   . Chronic steroid use   . History of  candidiasis   . Allergy     Revlimid  . Anemia of chronic disease   . B12 deficiency   . Hyperglycemia, drug-induced 03/09/2012  . Fracture Of Multiple Pubic Rami 03/08/2012  . Thrombocytopenia 03/10/2012  . Emphysema     Past Surgical History  Procedure Date  . Total abdominal hysterectomy w/ bilateral salpingoophorectomy     Incidental appendectomy  . Rotator cuff repair     Left  . Vein surgery     Ligation and stripping bilaterally  . Orif ankle fracture     Right  . Carpal tunnel release     Right  . Central venous catheter tunneled insertion single lumen 2012    Family History  Problem Relation Age of Onset  . Heart attack Brother     History  Substance Use Topics  . Smoking status: Never Smoker   . Smokeless tobacco: Never Used  . Alcohol Use: No    OB History    Grav Para Term Preterm Abortions TAB SAB Ect Mult Living                  Review of Systems  All other systems reviewed and are negative.  10 Systems reviewed and all are negative for acute change except as noted in the HPI.    Allergies  Lenalidomide  Home Medications   Current Outpatient Rx  Name Route Sig Dispense Refill  . ACETAMINOPHEN 325 MG  PO TABS Oral Take 2 tablets (650 mg total) by mouth 2 (two) times daily.    . ALBUTEROL SULFATE (5 MG/ML) 0.5% IN NEBU Nebulization Take 0.5 mLs (2.5 mg total) by nebulization every 4 (four) hours. 20 mL 0  . AMLODIPINE BESYLATE 5 MG PO TABS Oral Take 1 tablet (5 mg total) by mouth daily. 30 tablet 2  . VITAMIN D3 1000 UNITS PO CAPS Oral Take 1 capsule by mouth daily.    Marland Kitchen DIAZEPAM 5 MG PO TABS Oral Take 10 mg by mouth at bedtime as needed. For sleep/anxiety, Is taking 2 at bedtime    . DOCUSATE SODIUM 100 MG PO CAPS Oral Take 100 mg by mouth every Monday, Wednesday, and Friday. Takes in the evening    . FOLIC ACID 1 MG PO TABS Oral Take 1 mg by mouth daily.      . FUROSEMIDE 20 MG PO TABS Oral Take 1 tablet (20 mg total) by mouth daily. 30  tablet 0  . GUAIFENESIN ER 600 MG PO TB12 Oral Take 2 tablets (1,200 mg total) by mouth 2 (two) times daily. 14 tablet 0  . POLYSACCHARIDE IRON COMPLEX 150 MG PO CAPS Oral Take 150 mg by mouth every Monday, Wednesday, and Friday.     Marland Kitchen LOPERAMIDE HCL 2 MG PO TABS Oral Take 2 mg by mouth 4 (four) times daily as needed.    . NYSTATIN 100000 UNIT/GM EX CREA Topical Apply topically 2 (two) times daily. 30 g   . OMEPRAZOLE 20 MG PO CPDR Oral Take 1 capsule (20 mg total) by mouth daily. 30 capsule 4  . ONDANSETRON HCL 8 MG PO TABS Oral Take 1 tablet (8 mg total) by mouth every 8 (eight) hours as needed. nausea 20 tablet 0  . OXYCODONE HCL ER 10 MG PO TB12 Oral Take 10 mg by mouth every 12 (twelve) hours.    Marland Kitchen POTASSIUM CHLORIDE CRYS ER 20 MEQ PO TBCR Oral Take 20 mEq by mouth every Monday, Wednesday, and Friday.     Marland Kitchen PREDNISONE 20 MG PO TABS  TAKE 1 TABLET DAILY WITH BREAKFAST FOR 2 MORE DAYS AND THEN A HALF A TABLET DAILY WITH BREAKFAST FOR 2 MORE DAYS AND THEN STOP.    Marland Kitchen SPIRONOLACTONE 25 MG PO TABS Oral Take 25 mg by mouth daily.      BP 120/53  Temp 101.6 F (38.7 C) (Oral)  Resp 22  Ht 5\' 2"  (1.575 m)  Wt 208 lb (94.348 kg)  BMI 38.04 kg/m2  SpO2 93%  Physical Exam  Nursing note and vitals reviewed. Constitutional: She is oriented to person, place, and time. She appears well-developed and well-nourished. No distress.  HENT:  Head: Normocephalic and atraumatic.  Eyes: Conjunctivae are normal. Pupils are equal, round, and reactive to light.  Cardiovascular: Regular rhythm and normal heart sounds.  Tachycardia present.   Pulmonary/Chest: She has rhonchi (present throughout). She has rales (left base and right lateral lung field).  Abdominal: Soft. She exhibits no distension.  Musculoskeletal: She exhibits no edema.  Neurological: She is alert and oriented to person, place, and time.  Skin: Skin is warm and dry.  Psychiatric: She has a normal mood and affect. Her behavior is normal.     ED Course  Procedures (including critical care time) DIAGNOSTIC STUDIES: Oxygen Saturation is 93% on Ney, adequate by my interpretation.    COORDINATION OF CARE: 4:33PM EDP discusses pt ED treatment with pt. 4:33PM EDP orders medication: NaCl 0.9% infusion, albuterol  5 mg/ml, atrovent 0.5mg  6:26PM EDP rechecks pt and discusses pt lab results with pt. Pt will be admitted. Pt reports feeling better after breathing treatment. Lungs are clear.    Results for orders placed during the hospital encounter of 04/17/12  CBC WITH DIFFERENTIAL      Component Value Range   WBC 7.7  4.0 - 10.5 K/uL   RBC 2.29 (*) 3.87 - 5.11 MIL/uL   Hemoglobin 7.4 (*) 12.0 - 15.0 g/dL   HCT 16.1 (*) 09.6 - 04.5 %   MCV 101.3 (*) 78.0 - 100.0 fL   MCH 32.3  26.0 - 34.0 pg   MCHC 31.9  30.0 - 36.0 g/dL   RDW 40.9 (*) 81.1 - 91.4 %   Platelets 167  150 - 400 K/uL   Neutrophils Relative 90 (*) 43 - 77 %   Lymphocytes Relative 4 (*) 12 - 46 %   Monocytes Relative 5  3 - 12 %   Eosinophils Relative 1  0 - 5 %   Basophils Relative 0  0 - 1 %   Neutro Abs 6.9  1.7 - 7.7 K/uL   Lymphs Abs 0.3 (*) 0.7 - 4.0 K/uL   Monocytes Absolute 0.4  0.1 - 1.0 K/uL   Eosinophils Absolute 0.1  0.0 - 0.7 K/uL   Basophils Absolute 0.0  0.0 - 0.1 K/uL   RBC Morphology POLYCHROMASIA PRESENT     WBC Morphology ATYPICAL MONONUCLEAR CELLS     Smear Review LARGE PLATELETS PRESENT    BASIC METABOLIC PANEL      Component Value Range   Sodium 127 (*) 135 - 145 mEq/L   Potassium 4.0  3.5 - 5.1 mEq/L   Chloride 96  96 - 112 mEq/L   CO2 23  19 - 32 mEq/L   Glucose, Bld 116 (*) 70 - 99 mg/dL   BUN 27 (*) 6 - 23 mg/dL   Creatinine, Ser 7.82 (*) 0.50 - 1.10 mg/dL   Calcium 9.2  8.4 - 95.6 mg/dL   GFR calc non Af Amer 34 (*) >90 mL/min   GFR calc Af Amer 39 (*) >90 mL/min  URINALYSIS, ROUTINE W REFLEX MICROSCOPIC      Component Value Range   Color, Urine STRAW (*) YELLOW   APPearance CLEAR  CLEAR   Specific Gravity, Urine 1.010   1.005 - 1.030   pH 6.0  5.0 - 8.0   Glucose, UA NEGATIVE  NEGATIVE mg/dL   Hgb urine dipstick MODERATE (*) NEGATIVE   Bilirubin Urine NEGATIVE  NEGATIVE   Ketones, ur NEGATIVE  NEGATIVE mg/dL   Protein, ur NEGATIVE  NEGATIVE mg/dL   Urobilinogen, UA 0.2  0.0 - 1.0 mg/dL   Nitrite NEGATIVE  NEGATIVE   Leukocytes, UA NEGATIVE  NEGATIVE  URINE MICROSCOPIC-ADD ON      Component Value Range   Squamous Epithelial / LPF RARE  RARE   WBC, UA 0-2  <3 WBC/hpf   RBC / HPF 0-2  <3 RBC/hpf   Bacteria, UA RARE  RARE    Dg Chest Portable 1 View  04/17/2012  *RADIOLOGY REPORT*  Clinical Data: Fever and shortness of breath.  History of hypertension.  PORTABLE CHEST - 1 VIEW  Comparison: 04/01/2012  Findings: Cardiomegaly.   No infiltrates or failure.  No effusion or pneumothorax.   Central venous catheter tip proximal SVC unchanged from priors.  IMPRESSION: Cardiomegaly.  No active infiltrates.  Stable chest.  Original Report Authenticated By: Elsie Stain, M.D.  Date: 04/17/2012  Rate: 128  Rhythm: sinus tachycardia  QRS Axis: left  Intervals: normal  ST/T Wave abnormalities: normal  Conduction Disutrbances:left anterior fascicular block  Narrative Interpretation: Sinus tachycardia, left anterior fascicular block, probable old anteroseptal myocardial infarction based on Q waves in V1 and V2. No prior ECG available for comparison.  Old EKG Reviewed: none available     1. Fever   2. Multiple myeloma   3. Anemia   4. Hyponatremia   5. Renal insufficiency   6. Bronchospasm       MDM  Fever and bronchospasm despite treatment with antibiotics and nursing home. She has failed one course of doxycycline and 3 days of Augmentin. I strongly suspect that she has underlying pneumonia. Also, she has known anemia and was scheduled for a blood transfusion tomorrow. She is DO NOT RESUSCITATE status and has opted to discontinue chemotherapy for her myeloma. Prior records were reviewed.  Chest x-ray  is officially read as no pneumonia, but I strongly suspect that she actually has pneumonia which is is unable to be seen. She will be treated for health care associated pneumonia we'll with vancomycin and Zosyn. Following albuterol nebulizer treatment, her lungs are clear and she feels much better. Temperature has come down with acetaminophen and ibuprofen. Case is discussed with Dr. Sherrie Mustache who agrees to admit the patient. Since she was scheduled to get a blood transfusion tomorrow, blood transfusion will be done while she is in the hospital.    I personally performed the services described in this documentation, which was scribed in my presence. The recorded information has been reviewed and considered.      [name], MD 04/17/12 [phone]

## 2012-04-17 NOTE — Progress Notes (Signed)
1250 Patient c/o feeling nauseated. States tried to eat her lunch but she couldn't because she was sick on her stomach. Zofran 8mg  given per T.Kefalas,PA verbal order.  1335 Patient resting, denies c/o nausea at present. 1500 Spoke with Idell Pickles, nurse at [facility]. Informed her pt was given solucortef after IVIG infusion. They are to monitor temp q 2hrs and treat with prn Tylenol per their MAR for fever greater than 100.5. Verbalized understanding.

## 2012-04-17 NOTE — Progress Notes (Addendum)
ANTIBIOTIC CONSULT NOTE - INITIAL  Pharmacy Consult for Vancomycin and Renal Adjustment antibiotics  Indication: pneumonia  Allergies  Allergen Reactions  . Lenalidomide Rash    Patient Measurements: Height: 5\' 2"  (157.5 cm) Weight: 223 lb 5.2 oz (101.3 kg) IBW/kg (Calculated) : 50.1   Vital Signs: Temp: 98.4 F (36.9 C) (06/27 2142) Temp src: Oral (06/27 2142) BP: 94/61 mmHg (06/27 2142) Pulse Rate: 102  (06/27 2142) Intake/Output from previous day:   Intake/Output from this shift:    Labs:  Basename 04/17/12 1650  WBC 7.7  HGB 7.4*  PLT 167  LABCREA --  CREATININE 1.45*   Estimated Creatinine Clearance: 36.8 ml/min (by C-G formula based on Cr of 1.45). No results found for this basename: VANCOTROUGH:2,VANCOPEAK:2,VANCORANDOM:2,GENTTROUGH:2,GENTPEAK:2,GENTRANDOM:2,TOBRATROUGH:2,TOBRAPEAK:2,TOBRARND:2,AMIKACINPEAK:2,AMIKACINTROU:2,AMIKACIN:2, in the last 72 hours   Microbiology: No results found for this or any previous visit (from the past 720 hour(s)).  Medical History: Past Medical History  Diagnosis Date  . Essential hypertension, benign     LVH; RVH; normal EF in 04/2011 with the basilar inferior wall akinesis  . Edema   . Obesity   . Osteoporosis     Lumbar and thoracic compression fractures  . Multiple myeloma 2007    IgA lambda  . Gastroesophageal reflux disease   . Pernicious anemia     Plus thrombocytopenia;H./H.-9.8/29 in 12/2010  . Chronic kidney disease, stage 2, mildly decreased GFR     Creatinine of 1.3 in 09/2010; acute renal failure in 11/2010 secondary to diuretics with hyperkalemia secondary to Spiriva lactone  . Peripheral neuropathy     Complicating chemotherapy  . Chronic low back pain   . Chronic steroid use   . History of candidiasis   . Allergy     Revlimid  . Anemia of chronic disease   . B12 deficiency   . Hyperglycemia, drug-induced 03/09/2012  . Fracture Of Multiple Pubic Rami 03/08/2012  . Thrombocytopenia 03/10/2012  .  Emphysema     Medications:  Prescriptions prior to admission  Medication Sig Dispense Refill  . acetaminophen (TYLENOL) 325 MG tablet Take 2 tablets (650 mg total) by mouth 2 (two) times daily.      Marland Kitchen albuterol (PROVENTIL) (5 MG/ML) 0.5% nebulizer solution Take 0.5 mLs (2.5 mg total) by nebulization every 4 (four) hours.  20 mL  0  . amLODipine (NORVASC) 5 MG tablet Take 1 tablet (5 mg total) by mouth daily.  30 tablet  2  . Cholecalciferol (VITAMIN D3) 1000 UNITS CAPS Take 1 capsule by mouth daily.      . diazepam (VALIUM) 5 MG tablet Take 10 mg by mouth at bedtime as needed. For sleep/anxiety, Is taking 2 at bedtime      . docusate sodium (COLACE) 100 MG capsule Take 100 mg by mouth every Monday, Wednesday, and Friday. Takes in the evening      . folic acid (FOLVITE) 1 MG tablet Take 1 mg by mouth daily.        . furosemide (LASIX) 20 MG tablet Take 1 tablet (20 mg total) by mouth daily.  30 tablet  0  . guaiFENesin (MUCINEX) 600 MG 12 hr tablet Take 2 tablets (1,200 mg total) by mouth 2 (two) times daily.  14 tablet  0  . iron polysaccharides (NU-IRON) 150 MG capsule Take 150 mg by mouth every Monday, Wednesday, and Friday.       . loperamide (IMODIUM A-D) 2 MG tablet Take 2 mg by mouth 4 (four) times daily as needed.      Marland Kitchen  nystatin cream (MYCOSTATIN) Apply topically 2 (two) times daily.  30 g    . omeprazole (PRILOSEC) 20 MG capsule Take 1 capsule (20 mg total) by mouth daily.  30 capsule  4  . ondansetron (ZOFRAN) 8 MG tablet Take 1 tablet (8 mg total) by mouth every 8 (eight) hours as needed. nausea  20 tablet  0  . oxyCODONE (OXYCONTIN) 10 MG 12 hr tablet Take 10 mg by mouth every 12 (twelve) hours.      . potassium chloride SA (K-DUR,KLOR-CON) 20 MEQ tablet Take 20 mEq by mouth every Monday, Wednesday, and Friday.       . predniSONE (DELTASONE) 20 MG tablet TAKE 1 TABLET DAILY WITH BREAKFAST FOR 2 MORE DAYS AND THEN A HALF A TABLET DAILY WITH BREAKFAST FOR 2 MORE DAYS AND THEN STOP.       Marland Kitchen spironolactone (ALDACTONE) 25 MG tablet Take 25 mg by mouth daily.       Assessment:  78 yo F who is a NH resident and PMH of multiple myeloma started on empiric, broad spectrum antibiotics with Cefepime, Levaquin and Vancomycin for HCAP.   She has chronic renal insufficiency.   Goal of Therapy:  Vancomycin trough level 15-20 mcg/ml  Plan:  1) Vancomycin 1 GM IV every 24 hours 2) Change Cefepime 1 GM IV every 24 hours 3) Change Levaquin 750 mg IV every 48 hours 4) Vancomycin trough at steady state 5) Monitor renal function and cx data    Raquel James, [employer] 04/17/2012,10:36 PM Updated: Junita Push, PharmD, BCPS 04/18/2012@8 :53 AM

## 2012-04-17 NOTE — ED Notes (Signed)
EKG completed and given to Dr. Glick.

## 2012-04-18 ENCOUNTER — Encounter (HOSPITAL_COMMUNITY): Payer: Self-pay | Admitting: Internal Medicine

## 2012-04-18 ENCOUNTER — Ambulatory Visit (HOSPITAL_COMMUNITY): Payer: Medicare Other

## 2012-04-18 DIAGNOSIS — C9000 Multiple myeloma not having achieved remission: Secondary | ICD-10-CM

## 2012-04-18 DIAGNOSIS — R112 Nausea with vomiting, unspecified: Secondary | ICD-10-CM

## 2012-04-18 DIAGNOSIS — M311 Thrombotic microangiopathy: Secondary | ICD-10-CM

## 2012-04-18 DIAGNOSIS — J189 Pneumonia, unspecified organism: Secondary | ICD-10-CM

## 2012-04-18 DIAGNOSIS — J439 Emphysema, unspecified: Secondary | ICD-10-CM | POA: Diagnosis present

## 2012-04-18 LAB — CBC
HCT: 26.3 % — ABNORMAL LOW (ref 36.0–46.0)
Hemoglobin: 8.7 g/dL — ABNORMAL LOW (ref 12.0–15.0)
MCH: 31.8 pg (ref 26.0–34.0)
MCHC: 33.1 g/dL (ref 30.0–36.0)
MCV: 96 fL (ref 78.0–100.0)

## 2012-04-18 LAB — BASIC METABOLIC PANEL
BUN: 29 mg/dL — ABNORMAL HIGH (ref 6–23)
Chloride: 98 mEq/L (ref 96–112)
Creatinine, Ser: 1.36 mg/dL — ABNORMAL HIGH (ref 0.50–1.10)
Glucose, Bld: 130 mg/dL — ABNORMAL HIGH (ref 70–99)
Potassium: 3.7 mEq/L (ref 3.5–5.1)

## 2012-04-18 LAB — MRSA PCR SCREENING: MRSA by PCR: NEGATIVE

## 2012-04-18 MED ORDER — SODIUM CHLORIDE 0.9 % IJ SOLN
INTRAMUSCULAR | Status: AC
  Filled 2012-04-18: qty 6

## 2012-04-18 MED ORDER — FUROSEMIDE 20 MG PO TABS
30.0000 mg | ORAL_TABLET | Freq: Two times a day (BID) | ORAL | Status: DC
  Administered 2012-04-18 – 2012-04-19 (×3): 30 mg via ORAL
  Filled 2012-04-18 (×3): qty 2
  Filled 2012-04-18: qty 1

## 2012-04-18 MED ORDER — MUPIROCIN 2 % EX OINT: 1.0000 "application " | TOPICAL_OINTMENT | Freq: Two times a day (BID) | CUTANEOUS | Status: DC

## 2012-04-18 MED ORDER — CHLORHEXIDINE GLUCONATE CLOTH 2 % EX PADS
6.0000 | MEDICATED_PAD | Freq: Every day | CUTANEOUS | Status: DC
Start: 2012-04-18 — End: 2012-04-18

## 2012-04-18 MED ORDER — DEXTROSE 5 % IV SOLN
1.0000 g | INTRAVENOUS | Status: DC
  Administered 2012-04-18: 1 g via INTRAVENOUS
  Filled 2012-04-18 (×3): qty 1

## 2012-04-18 MED ORDER — SODIUM CHLORIDE 0.9 % IJ SOLN
INTRAMUSCULAR | Status: AC
  Administered 2012-04-18: 10 mL
  Filled 2012-04-18: qty 3

## 2012-04-18 NOTE — Clinical Social Work Placement (Addendum)
    Clinical Social Work Department CLINICAL SOCIAL WORK PLACEMENT NOTE 04/18/2012  Patient:  [name], [name]  Account Number:  [id number] Admit date:  04/17/2012  Clinical Social Worker:  Santa Genera, CLINICAL SOCIAL WORKER  Date/time:  04/18/2012 12:00 N  Clinical Social Work is seeking post-discharge placement for this patient at the following level of care:   SKILLED NURSING   (*CSW will update this form in Epic as items are completed)     Patient/family provided with [facility] Department of Clinical Social Work's list of facilities offering this level of care within the geographic area requested by the patient (or if unable, by the patient's family).    Patient/family informed of their freedom to choose among providers that offer the needed level of care, that participate in Medicare, Medicaid or managed care program needed by the patient, have an available bed and are willing to accept the patient.    Patient/family informed of [facility]' ownership interest in [facility], as well as of the fact that they are under no obligation to receive care at this facility.  PASARR submitted to EDS on     NA PASARR number received from EDS on   NA   FL2 transmitted to all facilities in geographic area requested by pt/family on NA FL2 transmitted to all facilities within larger geographic area on   Patient informed that his/her managed care company has contracts with or will negotiate with  certain facilities, including the following:  NA   Patient/family informed of bed offers received:   Patient chooses bed at [facility]  (Return) Physician recommends and patient chooses bed at    Patient to be transferred to [facility] on  04/19/2012 Patient to be transferred to facility by wheelchair with nursing staff  The following physician request were entered in Epic:  Additional Comments: Patient returning to [facility] at discharge.  Per PNC, no  update on FL2 was required.  Patient confirmed that she was agreeable to return to [facility].  PNC asked that discharge summary be provided in hard copy to weekend nursing supervisor, Olegario Messier [phone]).  Santa Genera C,LCSW Clinical Social Worker [phone])   Per MD- patient is stable for d/c today back to the [facility].  Spoke to patient, son, and Armed forces technical officer of the [facility]- DC summary sent to facility.  Notified pt's nurse Shelly who will call report. No further needs identified.   Lorri Frederick. [place]  [phone]

## 2012-04-18 NOTE — Progress Notes (Signed)
Subjective: The patient says that she feels better following the blood transfusions. She has a mild cough and some wheezing, but she says that her wheezing is chronic. She has no other complaints.  Objective: Vital signs in last 24 hours: Filed Vitals:   04/18/12 0540 04/18/12 0640 04/18/12 0701 04/18/12 0800  BP: 105/67 101/64  127/79  Pulse: 76 75  82  Temp: 98.3 F (36.8 C) 98 F (36.7 C)  98.4 F (36.9 C)  TempSrc: Oral Oral  Oral  Resp: 20 20  18   Height:      Weight:      SpO2:   95%     Intake/Output Summary (Last 24 hours) at 04/18/12 1610 Last data filed at 04/18/12 0805  Gross per 24 hour  Intake    120 ml  Output      0 ml  Net    120 ml    Weight change:   Physical exam: General: Morbidly obese 78 year old Caucasian woman, lying in bed, in no acute distress. HEENT: Head is normocephalic, nontraumatic. Pupils are equal, round, and reactive to light. Oropharynx reveals mildly dry mucous membranes. Lungs: Faint occasional upper airway wheezes and crackles. Mildly cleared with coughing. Heart: S1, S2, with a soft systolic murmur. Abdomen: Mildly obese, positive bowel sounds, soft, nontender, nondistended. Extremities: Trace to 1+ ankle edema. Neurologic: She is alert and oriented x3. Cranial nerves II through XII are grossly intact.  Lab Results: Basic Metabolic Panel:  Basename 04/17/12 1650  NA 127*  K 4.0  CL 96  CO2 23  GLUCOSE 116*  BUN 27*  CREATININE 1.45*  CALCIUM 9.2  MG --  PHOS --   Liver Function Tests: No results found for this basename: AST:2,ALT:2,ALKPHOS:2,BILITOT:2,PROT:2,ALBUMIN:2 in the last 72 hours No results found for this basename: LIPASE:2,AMYLASE:2 in the last 72 hours No results found for this basename: AMMONIA:2 in the last 72 hours CBC:  Basename 04/17/12 1650  WBC 7.7  NEUTROABS 6.9  HGB 7.4*  HCT 23.2*  MCV 101.3*  PLT 167   Cardiac Enzymes: No results found for this basename:  CKTOTAL:3,CKMB:3,CKMBINDEX:3,TROPONINI:3 in the last 72 hours BNP: No results found for this basename: PROBNP:3 in the last 72 hours D-Dimer: No results found for this basename: DDIMER:2 in the last 72 hours CBG: No results found for this basename: GLUCAP:6 in the last 72 hours Hemoglobin A1C: No results found for this basename: HGBA1C in the last 72 hours Fasting Lipid Panel: No results found for this basename: CHOL,HDL,LDLCALC,TRIG,CHOLHDL,LDLDIRECT in the last 72 hours Thyroid Function Tests: No results found for this basename: TSH,T4TOTAL,FREET4,T3FREE,THYROIDAB in the last 72 hours Anemia Panel: No results found for this basename: VITAMINB12,FOLATE,FERRITIN,TIBC,IRON,RETICCTPCT in the last 72 hours Coagulation: No results found for this basename: LABPROT:2,INR:2 in the last 72 hours Urine Drug Screen: Drugs of Abuse  No results found for this basename: labopia,  cocainscrnur,  labbenz,  amphetmu,  thcu,  labbarb    Alcohol Level: No results found for this basename: ETH:2 in the last 72 hours Urinalysis:  Basename 04/17/12 1712  COLORURINE STRAW*  LABSPEC 1.010  PHURINE 6.0  GLUCOSEU NEGATIVE  HGBUR MODERATE*  BILIRUBINUR NEGATIVE  KETONESUR NEGATIVE  PROTEINUR NEGATIVE  UROBILINOGEN 0.2  NITRITE NEGATIVE  LEUKOCYTESUR NEGATIVE   Misc. Labs:   Micro: Recent Results (from the past 240 hour(s))  MRSA PCR SCREENING     Status: Normal   Collection Time   04/18/12  7:28 AM      Component Value Range Status Comment  MRSA by PCR NEGATIVE  NEGATIVE Final     Studies/Results: Dg Chest Portable 1 View  04/17/2012  *RADIOLOGY REPORT*  Clinical Data: Fever and shortness of breath.  History of hypertension.  PORTABLE CHEST - 1 VIEW  Comparison: 04/01/2012  Findings: Cardiomegaly.   No infiltrates or failure.  No effusion or pneumothorax.   Central venous catheter tip proximal SVC unchanged from priors.  IMPRESSION: Cardiomegaly.  No active infiltrates.  Stable chest.   Original Report Authenticated By: Elsie Stain, M.D.    Medications:  Prior to Admission:  Prescriptions prior to admission  Medication Sig Dispense Refill  . acetaminophen (TYLENOL) 325 MG tablet Take 2 tablets (650 mg total) by mouth 2 (two) times daily.      Marland Kitchen albuterol (PROVENTIL) (5 MG/ML) 0.5% nebulizer solution Take 0.5 mLs (2.5 mg total) by nebulization every 4 (four) hours.  20 mL  0  . amLODipine (NORVASC) 5 MG tablet Take 1 tablet (5 mg total) by mouth daily.  30 tablet  2  . Cholecalciferol (VITAMIN D3) 1000 UNITS CAPS Take 1 capsule by mouth daily.      . diazepam (VALIUM) 5 MG tablet Take 10 mg by mouth at bedtime as needed. For sleep/anxiety, Is taking 2 at bedtime      . docusate sodium (COLACE) 100 MG capsule Take 100 mg by mouth every Monday, Wednesday, and Friday. Takes in the evening      . folic acid (FOLVITE) 1 MG tablet Take 1 mg by mouth daily.        . furosemide (LASIX) 20 MG tablet Take 1 tablet (20 mg total) by mouth daily.  30 tablet  0  . guaiFENesin (MUCINEX) 600 MG 12 hr tablet Take 2 tablets (1,200 mg total) by mouth 2 (two) times daily.  14 tablet  0  . iron polysaccharides (NU-IRON) 150 MG capsule Take 150 mg by mouth every Monday, Wednesday, and Friday.       . loperamide (IMODIUM A-D) 2 MG tablet Take 2 mg by mouth 4 (four) times daily as needed.      . nystatin cream (MYCOSTATIN) Apply topically 2 (two) times daily.  30 g    . omeprazole (PRILOSEC) 20 MG capsule Take 1 capsule (20 mg total) by mouth daily.  30 capsule  4  . ondansetron (ZOFRAN) 8 MG tablet Take 1 tablet (8 mg total) by mouth every 8 (eight) hours as needed. nausea  20 tablet  0  . oxyCODONE (OXYCONTIN) 10 MG 12 hr tablet Take 10 mg by mouth every 12 (twelve) hours.      . potassium chloride SA (K-DUR,KLOR-CON) 20 MEQ tablet Take 20 mEq by mouth every Monday, Wednesday, and Friday.       . predniSONE (DELTASONE) 20 MG tablet TAKE 1 TABLET DAILY WITH BREAKFAST FOR 2 MORE DAYS AND THEN A  HALF A TABLET DAILY WITH BREAKFAST FOR 2 MORE DAYS AND THEN STOP.      Marland Kitchen spironolactone (ALDACTONE) 25 MG tablet Take 25 mg by mouth daily.       Scheduled:   . albuterol  2.5 mg Nebulization Once  . albuterol  2.5 mg Nebulization Q4H  . albuterol      . amLODipine  5 mg Oral Daily  . ceFEPime (MAXIPIME) IV  1 g Intravenous Q24H  . docusate sodium  100 mg Oral Q M,W,F  . enoxaparin  40 mg Subcutaneous Q24H  . folic acid  1 mg Oral Daily  . furosemide  30 mg Oral BID  . guaiFENesin  1,200 mg Oral BID  . ibuprofen  400 mg Oral Once  . ipratropium  0.5 mg Nebulization Once  . levofloxacin (LEVAQUIN) IV  750 mg Intravenous Q48H  . oxyCODONE  10 mg Oral Q12H  . pantoprazole  40 mg Oral Q1200  . piperacillin-tazobactam  3.375 g Intravenous Once  . potassium chloride SA  20 mEq Oral Q M,W,F  . sodium chloride  1,000 mL Intravenous Once  . sodium chloride      . spironolactone  25 mg Oral Daily  . vancomycin  1,000 mg Intravenous Q24H  . DISCONTD: sodium chloride   Intravenous STAT  . DISCONTD: ceFEPime (MAXIPIME) IV  1 g Intravenous Q8H  . DISCONTD: ceFEPime (MAXIPIME) IV  1 g Intravenous Q12H  . DISCONTD: Chlorhexidine Gluconate Cloth  6 each Topical Q0600  . DISCONTD: furosemide  20 mg Oral Daily  . DISCONTD: levofloxacin (LEVAQUIN) IV  750 mg Intravenous Q24H  . DISCONTD: mupirocin ointment  1 application Nasal BID  . DISCONTD: vancomycin  1,000 mg Intravenous Once   Continuous:   . 0.9 % NaCl with KCl 20 mEq / L 50 mL/hr at 04/17/12 2253    Assessment: Principal Problem:  *Possible Pneumonia, organism unspecified Active Problems:  Essential hypertension, benign  Obesity  Multiple myeloma  Chronic kidney disease, stage 2, mildly decreased GFR  Bronchospasm  Hyponatremia  Anemia  Fever  Emphysema   1. Possible healthcare associated pneumonia, given fever and chest congestion. It is possible that the infiltrates have not been fluffed out on the chest x-ray because of  mild volume depletion. We'll continue broad-spectrum antibiotic treatment with cefepime, Levaquin, and vancomycin.  Transfusion-dependent anemia, secondary to multiple myeloma. Her hemoglobin was 7.4 on admission. She has received 2 units of packed red blood cells. Followup hemoglobin is pending.  Chronic kidney disease, stage II. Stable.  Emphysema with bronchospasms. We'll continue nebulizers as ordered.  Hyponatremia. She has a history of hyponatremia secondary to volume depletion during the previous hospitalization. We'll continue normal saline and followup on the laboratory studies pending.  Multiple myeloma. Apparently this is end stage and chemotherapy has been discontinued by Dr. Mariel Sleet.  Plan: 1. We'll check the results of the followup CBC and basic metabolic panel pending. 2. Will gently increase Lasix following the 2 units of blood transfusions. 3. Continue gentle IV fluids. 4. Consider discharging the patient back to the [facility] tomorrow with IV antibiotics. We'll check the blood cultures, currently pending.   LOS: 1 day   Klayten Jolliff 04/18/2012, 9:42 AM

## 2012-04-18 NOTE — Clinical Social Work Note (Signed)
Keri from [facility] states facility is agreeable to taking patient back.  Can do weekend admission.  Asked that if patient is discharged on weekend, CSW should get discharge summary to weekend nursing supervisor, Olegario Messier [phone]).    Clovis Cao Clinical Social Worker [phone])

## 2012-04-18 NOTE — Progress Notes (Signed)
UR Chart Review Completed  

## 2012-04-19 ENCOUNTER — Inpatient Hospital Stay
Admission: RE | Admit: 2012-04-19 | Discharge: 2012-05-17 | Disposition: A | Discharge status: Home / Self Care | Payer: Medicare Other | Source: Ambulatory Visit | Attending: Internal Medicine | Admitting: Internal Medicine

## 2012-04-19 ENCOUNTER — Encounter (HOSPITAL_COMMUNITY): Payer: Self-pay | Admitting: Internal Medicine

## 2012-04-19 DIAGNOSIS — R112 Nausea with vomiting, unspecified: Secondary | ICD-10-CM

## 2012-04-19 DIAGNOSIS — M311 Thrombotic microangiopathy: Secondary | ICD-10-CM

## 2012-04-19 DIAGNOSIS — J189 Pneumonia, unspecified organism: Secondary | ICD-10-CM

## 2012-04-19 DIAGNOSIS — C9000 Multiple myeloma not having achieved remission: Secondary | ICD-10-CM

## 2012-04-19 LAB — BASIC METABOLIC PANEL
CO2: 24 mEq/L (ref 19–32)
Calcium: 8.5 mg/dL (ref 8.4–10.5)
Chloride: 100 mEq/L (ref 96–112)
Creatinine, Ser: 1.26 mg/dL — ABNORMAL HIGH (ref 0.50–1.10)
GFR calc Af Amer: 47 mL/min — ABNORMAL LOW (ref >90)
Sodium: 132 mEq/L — ABNORMAL LOW (ref 135–145)

## 2012-04-19 LAB — CBC
MCV: 94.8 fL (ref 78.0–100.0)
Platelets: 135 10*3/uL — ABNORMAL LOW (ref 150–400)
RBC: 2.71 MIL/uL — ABNORMAL LOW (ref 3.87–5.11)
RDW: 17.9 % — ABNORMAL HIGH (ref 11.5–15.5)
WBC: 4.6 10*3/uL (ref 4.0–10.5)

## 2012-04-19 LAB — TYPE AND SCREEN

## 2012-04-19 MED ORDER — SODIUM CHLORIDE 0.9 % IJ SOLN
INTRAMUSCULAR | Status: AC
  Filled 2012-04-19: qty 3

## 2012-04-19 MED ORDER — HEPARIN SOD (PORK) LOCK FLUSH 100 UNIT/ML IV SOLN: 500.0000 [IU] | INTRAVENOUS | Status: DC | PRN

## 2012-04-19 MED ORDER — VANCOMYCIN HCL IN DEXTROSE 1-5 GM/200ML-% IV SOLN: 1000.0000 mg | INTRAVENOUS | Status: DC

## 2012-04-19 MED ORDER — OXYCODONE HCL 10 MG PO TB12: 10.0000 mg | ORAL_TABLET | Freq: Two times a day (BID) | ORAL | Status: DC

## 2012-04-19 MED ORDER — DIAZEPAM 5 MG PO TABS: 5.0000 mg | ORAL_TABLET | Freq: Four times a day (QID) | ORAL | Status: DC | PRN

## 2012-04-19 MED ORDER — DIAZEPAM 10 MG PO TABS: 10.0000 mg | ORAL_TABLET | Freq: Every evening | ORAL | Status: DC | PRN

## 2012-04-19 MED ORDER — LEVOFLOXACIN 750 MG PO TABS: 750.0000 mg | ORAL_TABLET | Freq: Every day | ORAL | Status: AC

## 2012-04-19 MED ORDER — ONDANSETRON HCL 8 MG PO TABS: 8.0000 mg | ORAL_TABLET | Freq: Three times a day (TID) | ORAL | Status: DC | PRN

## 2012-04-19 MED ORDER — OXYCODONE HCL 5 MG PO TABS: 5.0000 mg | ORAL_TABLET | ORAL | Status: DC | PRN

## 2012-04-19 NOTE — Discharge Summary (Addendum)
Physician Discharge Summary  VIELKA KLINEDINST MRN: [id number] DOB/AGE: 04-29-1933 78 y.o.  PCP: [name], MD   Admit date: 04/17/2012 Discharge date: 04/19/2012  Discharge Diagnoses:  1. Possible healthcare acquired pneumonia. Patient was discharged on oral Levaquin and vancomycin for one more week. WEEKLY VANCOMYCIN TROUGH AND SERUM CREATININE NEEDED BEFORE IV VANCOMYCIN ON 04/21/2012. 2. Chronic emphysema/COPD with mild exacerbation. 3. Anemia, secondary to multiple myeloma. Now transfusion dependent. Status post 2 units of packed red blood cell transfusions. Hemoglobin 7.4 on admission and 8.7 at the time of discharge. 4. Chronic hyponatremia. Serum sodium 127 on admission and 1:30 prior to discharge. 5. Stage II chronic kidney disease. Her creatinine was 1.26 upon discharge. 6. Multiple myeloma. Chemotherapy has been discontinued. 7. History of pubic rami fractures secondary to fall in May 2013. 8. Chronic thrombocytopenia. 9. Morbid obesity. 10. Gastroesophageal reflux disease. 11. Hypertension. 12. Osteoporosis. 13. Peripheral neuropathy.    Medication List  As of 04/19/2012  9:56 AM   STOP taking these medications         amoxicillin-clavulanate 500-125 MG per tablet         TAKE these medications         acetaminophen 325 MG tablet   Commonly known as: TYLENOL   Take 650 mg by mouth every 4 (four) hours as needed. For pain/fever      albuterol (2.5 MG/3ML) 0.083% nebulizer solution   Commonly known as: PROVENTIL   Take 2.5 mg by nebulization every 6 (six) hours.      amLODipine 5 MG tablet   Commonly known as: NORVASC   Take 1 tablet (5 mg total) by mouth daily.      diazepam 10 MG tablet   Commonly known as: VALIUM   Take 10 mg by mouth at bedtime as needed. For anxiety      diazepam 5 MG tablet   Commonly known as: VALIUM   Take 5 mg by mouth every 6 (six) hours as needed. For anxiety      docusate sodium 100 MG capsule   Commonly known as:  COLACE   Take 100 mg by mouth every Monday, Wednesday, and Friday. Takes in the evening      folic acid 1 MG tablet   Commonly known as: FOLVITE   Take 1 mg by mouth daily.      furosemide 20 MG tablet   Commonly known as: LASIX   Take 1 tablet (20 mg total) by mouth daily.      guaiFENesin 600 MG 12 hr tablet   Commonly known as: MUCINEX   Take 2 tablets (1,200 mg total) by mouth 2 (two) times daily.      levofloxacin 750 MG tablet   Commonly known as: LEVAQUIN   Take 1 tablet (750 mg total) by mouth daily. For 6 more days.      loperamide 2 MG tablet   Commonly known as: IMODIUM A-D   Take 2 mg by mouth 4 (four) times daily as needed.      NU-IRON 150 MG capsule   Generic drug: iron polysaccharides   Take 150 mg by mouth every Monday, Wednesday, and Friday.      nystatin 100000 UNIT/ML suspension   Commonly known as: MYCOSTATIN   Take 500,000 Units by mouth 4 (four) times daily.      omeprazole 20 MG capsule   Commonly known as: PRILOSEC   Take 1 capsule (20 mg total) by mouth daily.  ondansetron 8 MG tablet   Commonly known as: ZOFRAN   Take 1 tablet (8 mg total) by mouth every 8 (eight) hours as needed. nausea      oxyCODONE 10 MG 12 hr tablet   Commonly known as: OXYCONTIN   Take 10 mg by mouth every 12 (twelve) hours.      oxyCODONE 5 MG immediate release tablet   Commonly known as: Oxy IR/ROXICODONE   Take 5 mg by mouth every 4 (four) hours as needed. For breakthru pain      spironolactone 25 MG tablet   Commonly known as: ALDACTONE   Take 25 mg by mouth daily.      vancomycin 1 GM/200ML Soln   Commonly known as: VANCOCIN   Inject 200 mLs (1,000 mg total) into the vein daily. For 6 more days.      Vitamin D3 1000 UNITS Caps   Take 1 capsule by mouth daily.            Discharge Condition: Improved and stable.  Disposition: Skilled nursing facility.   Consults: None.   Significant Diagnostic Studies: Dg Chest 1 View  04/01/2012   *RADIOLOGY REPORT*  Clinical Data: Cough  CHEST - 1 VIEW  Comparison: Portable chest x-ray of 03/08/2012  Findings: The lungs are clear.  Cardiomegaly is stable.  Right- sided Port-A-Cath remains with the tip in the upper SVC.  No acute bony abnormality is seen.  IMPRESSION: Stable cardiomegaly.  No active lung disease.  Original Report Authenticated By: Juline Patch, M.D.   US Venous Img Lower Unilateral Left  04/07/2012  *RADIOLOGY REPORT*  Clinical Data: Left leg swelling, left hip pain  LEFT LOWER EXTREMITY VENOUS DUPLEX ULTRASOUND  Technique:  Gray-scale sonography with graded compression, as well as color Doppler and duplex ultrasound, were performed to evaluate the deep venous system of the lower extremity from the level of the common femoral vein through the popliteal and proximal calf veins. Spectral Doppler was utilized to evaluate flow at rest and with distal augmentation maneuvers.  Comparison:  11/22/2010.  Findings: Ultrasound examination of the left lower extremity demonstrate the visualized deep veins to be patent with good augmentation and compressibility noted.  There is no evidence of deep vein thrombosis.  No evidence of superficial vein thrombosis. Limited evaluation of the left calf veins.  IMPRESSION: No evidence of deep vein thrombosis left lower extremity.  Original Report Authenticated By: Natasha Mead, M.D.   Dg Chest Portable 1 View  04/17/2012  *RADIOLOGY REPORT*  Clinical Data: Fever and shortness of breath.  History of hypertension.  PORTABLE CHEST - 1 VIEW  Comparison: 04/01/2012  Findings: Cardiomegaly.   No infiltrates or failure.  No effusion or pneumothorax.   Central venous catheter tip proximal SVC unchanged from priors.  IMPRESSION: Cardiomegaly.  No active infiltrates.  Stable chest.  Original Report Authenticated By: Elsie Stain, M.D.     Microbiology: Recent Results (from the past 240 hour(s))  CULTURE, BLOOD (ROUTINE X 2)     Status: Normal (Preliminary result)    Collection Time   04/17/12  4:50 PM      Component Value Range Status Comment   Specimen Description BLOOD PORTA CATH DRAWN BY RN   Final    Special Requests BOTTLES DRAWN AEROBIC AND ANAEROBIC 8CC   Final    Culture NO GROWTH 2 DAYS   Final    Report Status PENDING   Incomplete   CULTURE, BLOOD (ROUTINE X 2)  Status: Normal (Preliminary result)   Collection Time   04/17/12  5:00 PM      Component Value Range Status Comment   Specimen Description BLOOD LEFT ANTECUBITAL   Final    Special Requests BOTTLES DRAWN AEROBIC AND ANAEROBIC 15CC   Final    Culture NO GROWTH 2 DAYS   Final    Report Status PENDING   Incomplete   MRSA PCR SCREENING     Status: Normal   Collection Time   04/18/12  7:28 AM      Component Value Range Status Comment   MRSA by PCR NEGATIVE  NEGATIVE Final      Labs: Results for orders placed during the hospital encounter of 04/17/12 (from the past 48 hour(s))  CBC WITH DIFFERENTIAL     Status: Abnormal   Collection Time   04/17/12  4:50 PM      Component Value Range Comment   WBC 7.7  4.0 - 10.5 K/uL    RBC 2.29 (*) 3.87 - 5.11 MIL/uL    Hemoglobin 7.4 (*) 12.0 - 15.0 g/dL    HCT 08.6 (*) 57.8 - 46.0 %    MCV 101.3 (*) 78.0 - 100.0 fL    MCH 32.3  26.0 - 34.0 pg    MCHC 31.9  30.0 - 36.0 g/dL    RDW 46.9 (*) 62.9 - 15.5 %    Platelets 167  150 - 400 K/uL    Neutrophils Relative 90 (*) 43 - 77 %    Lymphocytes Relative 4 (*) 12 - 46 %    Monocytes Relative 5  3 - 12 %    Eosinophils Relative 1  0 - 5 %    Basophils Relative 0  0 - 1 %    Neutro Abs 6.9  1.7 - 7.7 K/uL    Lymphs Abs 0.3 (*) 0.7 - 4.0 K/uL    Monocytes Absolute 0.4  0.1 - 1.0 K/uL    Eosinophils Absolute 0.1  0.0 - 0.7 K/uL    Basophils Absolute 0.0  0.0 - 0.1 K/uL    RBC Morphology POLYCHROMASIA PRESENT      WBC Morphology ATYPICAL MONONUCLEAR CELLS      Smear Review LARGE PLATELETS PRESENT   GIANT PLATELETS SEEN  BASIC METABOLIC PANEL     Status: Abnormal   Collection Time    04/17/12  4:50 PM      Component Value Range Comment   Sodium 127 (*) 135 - 145 mEq/L    Potassium 4.0  3.5 - 5.1 mEq/L    Chloride 96  96 - 112 mEq/L    CO2 23  19 - 32 mEq/L    Glucose, Bld 116 (*) 70 - 99 mg/dL    BUN 27 (*) 6 - 23 mg/dL    Creatinine, Ser 5.28 (*) 0.50 - 1.10 mg/dL    Calcium 9.2  8.4 - 41.3 mg/dL    GFR calc non Af Amer 34 (*) >90 mL/min    GFR calc Af Amer 39 (*) >90 mL/min   CULTURE, BLOOD (ROUTINE X 2)     Status: Normal (Preliminary result)   Collection Time   04/17/12  4:50 PM      Component Value Range Comment   Specimen Description BLOOD PORTA CATH DRAWN BY RN      Special Requests BOTTLES DRAWN AEROBIC AND ANAEROBIC 8CC      Culture NO GROWTH 2 DAYS      Report Status PENDING  CULTURE, BLOOD (ROUTINE X 2)     Status: Normal (Preliminary result)   Collection Time   04/17/12  5:00 PM      Component Value Range Comment   Specimen Description BLOOD LEFT ANTECUBITAL      Special Requests BOTTLES DRAWN AEROBIC AND ANAEROBIC 15CC      Culture NO GROWTH 2 DAYS      Report Status PENDING     URINALYSIS, ROUTINE W REFLEX MICROSCOPIC     Status: Abnormal   Collection Time   04/17/12  5:12 PM      Component Value Range Comment   Color, Urine STRAW (*) YELLOW    APPearance CLEAR  CLEAR    Specific Gravity, Urine 1.010  1.005 - 1.030    pH 6.0  5.0 - 8.0    Glucose, UA NEGATIVE  NEGATIVE mg/dL    Hgb urine dipstick MODERATE (*) NEGATIVE    Bilirubin Urine NEGATIVE  NEGATIVE    Ketones, ur NEGATIVE  NEGATIVE mg/dL    Protein, ur NEGATIVE  NEGATIVE mg/dL    Urobilinogen, UA 0.2  0.0 - 1.0 mg/dL    Nitrite NEGATIVE  NEGATIVE    Leukocytes, UA NEGATIVE  NEGATIVE   URINE MICROSCOPIC-ADD ON     Status: Normal   Collection Time   04/17/12  5:12 PM      Component Value Range Comment   Squamous Epithelial / LPF RARE  RARE    WBC, UA 0-2  <3 WBC/hpf    RBC / HPF 0-2  <3 RBC/hpf    Bacteria, UA RARE  RARE   STREP PNEUMONIAE URINARY ANTIGEN     Status: Normal    Collection Time   04/17/12 10:22 PM      Component Value Range Comment   Strep Pneumo Urinary Antigen NEGATIVE  NEGATIVE   MRSA PCR SCREENING     Status: Normal   Collection Time   04/18/12  7:28 AM      Component Value Range Comment   MRSA by PCR NEGATIVE  NEGATIVE   BASIC METABOLIC PANEL     Status: Abnormal   Collection Time   04/18/12 10:18 AM      Component Value Range Comment   Sodium 130 (*) 135 - 145 mEq/L    Potassium 3.7  3.5 - 5.1 mEq/L    Chloride 98  96 - 112 mEq/L    CO2 22  19 - 32 mEq/L    Glucose, Bld 130 (*) 70 - 99 mg/dL    BUN 29 (*) 6 - 23 mg/dL    Creatinine, Ser 1.61 (*) 0.50 - 1.10 mg/dL    Calcium 8.6  8.4 - 09.6 mg/dL    GFR calc non Af Amer 37 (*) >90 mL/min    GFR calc Af Amer 43 (*) >90 mL/min   CBC     Status: Abnormal   Collection Time   04/18/12 10:18 AM      Component Value Range Comment   WBC 5.3  4.0 - 10.5 K/uL    RBC 2.74 (*) 3.87 - 5.11 MIL/uL    Hemoglobin 8.7 (*) 12.0 - 15.0 g/dL    HCT 04.5 (*) 40.9 - 46.0 %    MCV 96.0  78.0 - 100.0 fL    MCH 31.8  26.0 - 34.0 pg    MCHC 33.1  30.0 - 36.0 g/dL    RDW 81.1 (*) 91.4 - 15.5 %    Platelets 135 (*) 150 - 400  K/uL   BASIC METABOLIC PANEL     Status: Abnormal   Collection Time   04/19/12  6:25 AM      Component Value Range Comment   Sodium 132 (*) 135 - 145 mEq/L    Potassium 3.8  3.5 - 5.1 mEq/L    Chloride 100  96 - 112 mEq/L    CO2 24  19 - 32 mEq/L    Glucose, Bld 90  70 - 99 mg/dL    BUN 28 (*) 6 - 23 mg/dL    Creatinine, Ser 9.56 (*) 0.50 - 1.10 mg/dL    Calcium 8.5  8.4 - 21.3 mg/dL    GFR calc non Af Amer 40 (*) >90 mL/min    GFR calc Af Amer 47 (*) >90 mL/min   CBC     Status: Abnormal   Collection Time   04/19/12  6:25 AM      Component Value Range Comment   WBC 4.6  4.0 - 10.5 K/uL    RBC 2.71 (*) 3.87 - 5.11 MIL/uL    Hemoglobin 8.7 (*) 12.0 - 15.0 g/dL    HCT 08.6 (*) 57.8 - 46.0 %    MCV 94.8  78.0 - 100.0 fL    MCH 32.1  26.0 - 34.0 pg    MCHC 33.9  30.0 - 36.0  g/dL    RDW 46.9 (*) 62.9 - 15.5 %    Platelets 135 (*) 150 - 400 K/uL      HPI : The patient is a 50 are old woman with a history significant for multiple myeloma, previous pubic rami fractures from a fall in May 2013, hypertension, and morbid obesity. She presented to the hospital from the [facility] with a chief complaint of fever and chest congestion. She had been started on Augmentin at the skilled nursing facility for an upper respiratory or lower respiratory infection. In the emergency department, she was febrile with temperature 100.4. She was mildly tachycardic. Her blood pressure was within normal limits. Her chest x-ray revealed cardiomegaly with, but with no active infiltrates. Her lab data were significant for WBC of 7.7, hemoglobin of 7.4, platelet count of 167, sodium of 127, creatinine 1.45, and a urinalysis negative for infection. She was admitted for further evaluation and management.  HOSPITAL COURSE: Blood cultures were ordered in the emergency department. Although her chest x-ray did not show active infiltrates on admission, it was thought that she probably had healthcare acquired pneumonia that had not fluffed out on the chest x-ray. Therefore, she was started on broad-spectrum antibiotic treatment with IV Levaquin, cefepime, and vancomycin. For treatment of chronic emphysema/COPD, she was continued on bronchodilator therapy with albuterol. For hyponatremia, gentle IV fluids were started. She was transfused 2 units of packed red blood cells for transfusion-dependent anemia secondary to multiple myeloma. Most, if not all, of her other chronic medications, including her pain medications, were continued. She was continued on Lasix, however, the dose was increased slightly to account for the extra fluid volume she received with the blood transfusions.  The patient improved clinically and symptomatically. She remained afebrile. Her white blood cell count remained within normal limits but  decreased 4.6. Her hemoglobin improved to 8.7 and was maintained at 8.7 prior to discharge. Her serum sodium improved to 132. Her creatinine improved to 1.26. Her strep pneumo urinary antigen was negative. Her blood cultures remain negative, but final results were pending at the time of discharge. She had less chest congestion and shortness  of breath.  She received 2 full days of antibiotic therapy. She was discharged on oral Levaquin for 6 more days and on IV vancomycin for 6 more days. A vancomycin trough and serum creatinine will need to be ordered on Monday 04/21/2012 before the IV vancomycin dose is given.  Discharge Exam:  Blood pressure 139/79, pulse 78, temperature 98 F (36.7 C), temperature source Oral, resp. rate 19, height 5\' 2"  (1.575 m), weight 99.156 kg (218 lb 9.6 oz), SpO2 97.00%.  Lungs: Clear anteriorly with decreased breath sounds in the bases. Breathing nonlabored. Heart: S1, S2, with a soft systolic murmur. Abdomen: Morbidly obese, positive bowel sounds, soft, nontender, nondistended. Extremities: Trace of pedal edema bilaterally.   Discharge Orders    Future Appointments: Provider: Department: Dept Phone: Center:   04/21/2012 10:30 AM [facility]  None   04/22/2012 8:45 AM [facility] [phone] None   05/08/2012 1:40 PM Jonelle Sidle, MD [facility] [phone] [facility]   05/19/2012 10:00 AM Randall An, MD [facility] [phone] None     Future Orders Please Complete By Expires   Diet - low sodium heart healthy      Increase activity slowly      Discharge instructions      Comments:   WEEKLY VANCOMYCIN TROUGH AND SERUM CREATININE NEEDED PRIOR TO VANCOMYCIN DOSE ON MONDAY 04/21/2012.      Total discharge time: Greater than 35 minutes.  Signed: Kiondre Grenz 04/19/2012, 9:56 AM        ADDENDUM:  RN reported patient's 02 sat dropped to 83-84 % on room air, yet she had been  oxygenating in the upper 95-98% on only 2 liters of oxygen. The 02 sat on room air is not consistent with her 02 sat on supplemental 02. Rather than order an ABG on this patient who has improved clinically, will discharge her on nasal cannula 02, 2 liters per minute and have the SNF staff reassess after her PNA and COPD exacerbation have been completely treated.

## 2012-04-19 NOTE — Progress Notes (Signed)
After shifting in bed, patient now stating at 96% on room air. MD notified.

## 2012-04-19 NOTE — Progress Notes (Signed)
Per Dr. Sherrie Mustache leave implanted port on patient accessed so the [facility] can administer the vanco IV.

## 2012-04-19 NOTE — Progress Notes (Signed)
Patient in stable condition and transported to [facility] by tech. Tech transported Industrial/product designer with patient.

## 2012-04-19 NOTE — Progress Notes (Signed)
Patient stating 83% on room air.

## 2012-04-19 NOTE — Progress Notes (Signed)
Report called to Jonette Mate, LPN with [facility]. Carleen notified that port will remain accessed, Carleen verified this was ok. Discharge instructions and Social Worker packet will be given to tech.

## 2012-04-21 ENCOUNTER — Ambulatory Visit (HOSPITAL_COMMUNITY)
Admission: RE | Admit: 2012-04-21 | Discharge: 2012-04-21 | Disposition: A | Discharge status: Home / Self Care | Payer: Medicare Other | Source: Ambulatory Visit | Attending: Cardiology | Admitting: Cardiology

## 2012-04-21 DIAGNOSIS — R609 Edema, unspecified: Secondary | ICD-10-CM | POA: Insufficient documentation

## 2012-04-21 DIAGNOSIS — N189 Chronic kidney disease, unspecified: Secondary | ICD-10-CM | POA: Insufficient documentation

## 2012-04-21 DIAGNOSIS — R011 Cardiac murmur, unspecified: Secondary | ICD-10-CM | POA: Insufficient documentation

## 2012-04-21 DIAGNOSIS — I129 Hypertensive chronic kidney disease with stage 1 through stage 4 chronic kidney disease, or unspecified chronic kidney disease: Secondary | ICD-10-CM | POA: Insufficient documentation

## 2012-04-21 DIAGNOSIS — I517 Cardiomegaly: Secondary | ICD-10-CM

## 2012-04-21 DIAGNOSIS — R072 Precordial pain: Secondary | ICD-10-CM | POA: Insufficient documentation

## 2012-04-21 DIAGNOSIS — I429 Cardiomyopathy, unspecified: Secondary | ICD-10-CM

## 2012-04-21 LAB — LEGIONELLA ANTIGEN, URINE

## 2012-04-21 NOTE — Progress Notes (Signed)
*  PRELIMINARY RESULTS* Echocardiogram 2D Echocardiogram has been performed.  [name] 04/21/2012, 11:29 AM

## 2012-04-22 ENCOUNTER — Encounter (HOSPITAL_COMMUNITY): Payer: Medicare Other | Attending: Oncology

## 2012-04-22 DIAGNOSIS — E669 Obesity, unspecified: Secondary | ICD-10-CM | POA: Insufficient documentation

## 2012-04-22 DIAGNOSIS — E538 Deficiency of other specified B group vitamins: Secondary | ICD-10-CM | POA: Insufficient documentation

## 2012-04-22 DIAGNOSIS — D649 Anemia, unspecified: Secondary | ICD-10-CM | POA: Insufficient documentation

## 2012-04-22 DIAGNOSIS — J984 Other disorders of lung: Secondary | ICD-10-CM

## 2012-04-22 DIAGNOSIS — D510 Vitamin B12 deficiency anemia due to intrinsic factor deficiency: Secondary | ICD-10-CM | POA: Insufficient documentation

## 2012-04-22 DIAGNOSIS — N289 Disorder of kidney and ureter, unspecified: Secondary | ICD-10-CM | POA: Insufficient documentation

## 2012-04-22 DIAGNOSIS — Z9889 Other specified postprocedural states: Secondary | ICD-10-CM | POA: Insufficient documentation

## 2012-04-22 DIAGNOSIS — D696 Thrombocytopenia, unspecified: Secondary | ICD-10-CM | POA: Insufficient documentation

## 2012-04-22 DIAGNOSIS — C9000 Multiple myeloma not having achieved remission: Secondary | ICD-10-CM | POA: Insufficient documentation

## 2012-04-22 DIAGNOSIS — K219 Gastro-esophageal reflux disease without esophagitis: Secondary | ICD-10-CM | POA: Insufficient documentation

## 2012-04-22 DIAGNOSIS — N182 Chronic kidney disease, stage 2 (mild): Secondary | ICD-10-CM | POA: Insufficient documentation

## 2012-04-22 LAB — CULTURE, BLOOD (ROUTINE X 2)
Culture: NO GROWTH
Culture: NO GROWTH

## 2012-04-22 MED ORDER — EPOETIN ALFA 40000 UNIT/ML IJ SOLN
INTRAMUSCULAR | Status: AC
  Filled 2012-04-22: qty 1

## 2012-04-22 MED ORDER — EPOETIN ALFA 40000 UNIT/ML IJ SOLN
40000.0000 [IU] | Freq: Once | INTRAMUSCULAR | Status: AC
  Administered 2012-04-22: 40000 [IU] via SUBCUTANEOUS

## 2012-04-22 MED ORDER — SODIUM CHLORIDE 0.9 % IJ SOLN
INTRAMUSCULAR | Status: AC
  Filled 2012-04-22: qty 10

## 2012-04-22 MED ORDER — CYANOCOBALAMIN 1000 MCG/ML IJ SOLN
1000.0000 ug | Freq: Once | INTRAMUSCULAR | Status: AC
  Administered 2012-04-22: 1000 ug via INTRAMUSCULAR

## 2012-04-22 MED ORDER — ZOLEDRONIC ACID 4 MG/5ML IV CONC
2.0000 mg | Freq: Once | INTRAVENOUS | Status: AC
  Administered 2012-04-22: 2 mg via INTRAVENOUS
  Filled 2012-04-22: qty 2.5

## 2012-04-22 MED ORDER — CYANOCOBALAMIN 1000 MCG/ML IJ SOLN
INTRAMUSCULAR | Status: AC
  Filled 2012-04-22: qty 1

## 2012-04-22 NOTE — Progress Notes (Signed)
[name] presents today for injection per MD orders. B12 administered SQ in right Upper Arm.  Zometa administered IV via PAC, already accessed upon arrival.  PAC flushed and left accessed.   Administration without incident. Patient tolerated well.  Procrit administered per [facility] details.  Gretta Began, RN

## 2012-04-28 ENCOUNTER — Encounter: Payer: Self-pay | Admitting: Oncology

## 2012-04-29 ENCOUNTER — Other Ambulatory Visit (HOSPITAL_COMMUNITY): Payer: Self-pay | Admitting: *Deleted

## 2012-04-29 DIAGNOSIS — C9000 Multiple myeloma not having achieved remission: Secondary | ICD-10-CM

## 2012-05-05 ENCOUNTER — Other Ambulatory Visit (HOSPITAL_COMMUNITY): Payer: Medicare Other

## 2012-05-08 ENCOUNTER — Encounter: Payer: Self-pay | Admitting: Cardiology

## 2012-05-08 ENCOUNTER — Ambulatory Visit (INDEPENDENT_AMBULATORY_CARE_PROVIDER_SITE_OTHER): Payer: Medicare Other | Admitting: Cardiology

## 2012-05-08 VITALS — BP 116/68 | HR 78 | Ht 62.0 in | Wt 225.0 lb

## 2012-05-08 DIAGNOSIS — I429 Cardiomyopathy, unspecified: Secondary | ICD-10-CM

## 2012-05-08 NOTE — Progress Notes (Signed)
   Clinical Summary [name] is a 78 y.o.female presenting for followup. She was seen in May. She has been treated with Doxil by Dr. Mariel Sleet. Recent followup echocardiogram in July shows normal LVEF of 60-65%, grade 1 diastolic dysfunction. This has remained stable was no evidence of induced cardiomyopathy. Reviewed this with her today.  She is in a wheelchair, currently undergoing rehabilitation. Still has chronic lower extremity edema that has not changed.  My understanding in reviewing the recent notes is that further chemotherapy is not planned at this time, otherwise with supportive measures.   Allergies  Allergen Reactions  . Lenalidomide Rash    No current outpatient prescriptions on file.    Past Medical History  Diagnosis Date  . Essential hypertension, benign     LVH; RVH; normal EF in 04/2011 with the basilar inferior wall akinesis  . Edema   . Obesity   . Osteoporosis     Lumbar and thoracic compression fractures  . Multiple myeloma 2007    IgA lambda  . Gastroesophageal reflux disease   . Pernicious anemia     Plus thrombocytopenia;H./H.-9.8/29 in 12/2010  . Chronic kidney disease, stage 2, mildly decreased GFR     Creatinine of 1.3 in 09/2010; acute renal failure in 11/2010 secondary to diuretics with hyperkalemia secondary to Spiriva lactone  . Peripheral neuropathy     Complicating chemotherapy  . Chronic low back pain   . Chronic steroid use   . History of candidiasis   . Allergy     Revlimid  . Anemia of chronic disease   . B12 deficiency   . Hyperglycemia, drug-induced 03/09/2012  . Fracture Of Multiple Pubic Rami 03/08/2012  . Thrombocytopenia 03/10/2012  . Emphysema     Social History [name] reports that she has never smoked. She has never used smokeless tobacco. [name] reports that she does not drink alcohol.  Review of Systems No palpitations. Chronic edema. Stable appetite.  Physical Examination Filed Vitals:   05/08/12 1358  BP:  116/68  Pulse: 78    Obese woman in no acute distress. Seated in wheelchair. HEENT: Conjunctiva and lids normal, oropharynx clear with moist mucosa.  Neck: Supple, no elevated JVP or carotid bruits, no thyromegaly.  Lungs: Clear to auscultation, nonlabored breathing at rest.  Cardiac: Regular rate and rhythm with ectopic beats, no S3, soft systolic murmur, no pericardial rub.  Abdomen: Soft, nontender, protuberant, bowel sounds present, no guarding or rebound.  Extremities: Chronic appearing edema is 2+, distal pulses 1-2+.    Problem List and Plan   Secondary cardiomyopathy, unspecified No evidence of induced cardiomyopathy during treatment with Doxil. As she has no further plans for additional chemotherapy, no further cardiac testing is planned at this time. She will continue to see Dr. Mariel Sleet, and our followup can be p.r.n.    Jonelle Sidle, M.D., F.A.C.C.

## 2012-05-08 NOTE — Patient Instructions (Addendum)
PER DR. MCDOWELL YOU CAN FOLLOW UP AS NEEDED  NO CHANGES WERE MADE TODAY

## 2012-05-08 NOTE — Assessment & Plan Note (Signed)
No evidence of induced cardiomyopathy during treatment with Doxil. As she has no further plans for additional chemotherapy, no further cardiac testing is planned at this time. She will continue to see Dr. Mariel Sleet, and our followup can be p.r.n.

## 2012-05-19 ENCOUNTER — Other Ambulatory Visit (HOSPITAL_COMMUNITY): Payer: Self-pay

## 2012-05-19 ENCOUNTER — Encounter (HOSPITAL_BASED_OUTPATIENT_CLINIC_OR_DEPARTMENT_OTHER): Payer: Medicare Other | Admitting: Oncology

## 2012-05-19 VITALS — BP 127/81 | HR 105 | Temp 98.6°F

## 2012-05-19 DIAGNOSIS — D649 Anemia, unspecified: Secondary | ICD-10-CM

## 2012-05-19 DIAGNOSIS — C9000 Multiple myeloma not having achieved remission: Secondary | ICD-10-CM

## 2012-05-19 DIAGNOSIS — M810 Age-related osteoporosis without current pathological fracture: Secondary | ICD-10-CM

## 2012-05-19 DIAGNOSIS — D801 Nonfamilial hypogammaglobulinemia: Secondary | ICD-10-CM

## 2012-05-19 DIAGNOSIS — N182 Chronic kidney disease, stage 2 (mild): Secondary | ICD-10-CM

## 2012-05-19 DIAGNOSIS — R609 Edema, unspecified: Secondary | ICD-10-CM

## 2012-05-19 LAB — CBC
MCV: 98 fL (ref 78.0–100.0)
Platelets: 218 10*3/uL (ref 150–400)
RBC: 2.99 MIL/uL — ABNORMAL LOW (ref 3.87–5.11)
RDW: 16.4 % — ABNORMAL HIGH (ref 11.5–15.5)
WBC: 8.3 10*3/uL (ref 4.0–10.5)

## 2012-05-19 NOTE — Progress Notes (Signed)
Problem #1 recurrent multiple myeloma unfortunately with fractures of her pubic rami on the left and she was released from the [facility] on Saturday this past weekend and is now home. She has decided not to pursue more chemotherapy but still needs palliative therapy.  Problem #2 chronic lower extremity edema in its 4+ on the left and 3+ on the right and we will increase her spironolactone to 50 mg 3 times a day keep her Lasix as it is and reduce her spironolactone to twice a day once the swelling goes down.  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 Problem #4 osteoporosis, bone damage from her myeloma and we will continue her Zometa once every 4-6 weeks.  Problem #5 intermittent symptomatic anemia.  She has been very weak and she looks slightly pale today but her hemoglobin is better than it's been. I think we need to initiate Aranesp every 3 weeks to maintain her if possible without the need for transfusion. She has the edema mentioned above which is at least 3-4+ in the left and 3+ on the right. She cannot get around the house with her walker with this swelling and increased weight in her legs. So we will try the spironolactone at the above-mentioned doses. She will stay with the Lasix once a day.  I think the IVIG working very well to get rid of the bronchitis and she is no longer coughing up any sputum. Her children had several questions both her son and daughter were with her today. Their main question revolved around her length of life and I think that's very difficult to estimate at this time.  I want to see her every 8 weeks we can check some blood work in a few weeks. Her hemoglobin was actually better than anticipated when her laboratory results came back

## 2012-05-19 NOTE — Patient Instructions (Addendum)
JASPREET BODNER  [id number] 04-Oct-1933 Dr. Glenford Peers   [facility]  Discharge Instructions  RECOMMENDATIONS MADE BY THE CONSULTANT AND ANY TEST RESULTS WILL BE SENT TO YOUR REFERRING DOCTOR.   EXAM FINDINGS BY MD TODAY AND SIGNS AND SYMPTOMS TO REPORT TO CLINIC OR PRIMARY MD: exam and discussion per MD.  Will check labs today and get you scheduled for blood transfusion tomorrow.  We will start you on aranesp and will check labs and give you the aranesp every 21 days.  MEDICATIONS PRESCRIBED: Spironolactone take 50 mg three times daily until swelling goes down then take it twice daily.  INSTRUCTIONS GIVEN AND DISCUSSED: Other :  Report increased shortness of breath or other problems.  SPECIAL INSTRUCTIONS/FOLLOW-UP: Lab work Needed today and every 21 days and Return to Clinic tomorrow for transfusion and in 2 months to see MD.   I acknowledge that I have been informed and understand all the instructions given to me and received a copy. I do not have any more questions at this time, but understand that I may call the Specialty Clinic at [facility] at [phone] during business hours should I have any further questions or need assistance in obtaining follow-up care.    __________________________________________  _____________  __________ Signature of Patient or Authorized Representative            Date                   Time    __________________________________________ Nurse's Signature

## 2012-05-20 ENCOUNTER — Other Ambulatory Visit (HOSPITAL_COMMUNITY): Payer: Self-pay | Admitting: Oncology

## 2012-05-20 ENCOUNTER — Encounter (HOSPITAL_COMMUNITY): Payer: Medicare Other

## 2012-05-20 ENCOUNTER — Encounter (HOSPITAL_BASED_OUTPATIENT_CLINIC_OR_DEPARTMENT_OTHER): Payer: Medicare Other

## 2012-05-20 VITALS — BP 110/71 | HR 71 | Temp 97.9°F

## 2012-05-20 DIAGNOSIS — D696 Thrombocytopenia, unspecified: Secondary | ICD-10-CM

## 2012-05-20 DIAGNOSIS — D510 Vitamin B12 deficiency anemia due to intrinsic factor deficiency: Secondary | ICD-10-CM

## 2012-05-20 DIAGNOSIS — E669 Obesity, unspecified: Secondary | ICD-10-CM

## 2012-05-20 DIAGNOSIS — N182 Chronic kidney disease, stage 2 (mild): Secondary | ICD-10-CM

## 2012-05-20 DIAGNOSIS — C9000 Multiple myeloma not having achieved remission: Secondary | ICD-10-CM

## 2012-05-20 DIAGNOSIS — K219 Gastro-esophageal reflux disease without esophagitis: Secondary | ICD-10-CM

## 2012-05-20 DIAGNOSIS — N289 Disorder of kidney and ureter, unspecified: Secondary | ICD-10-CM

## 2012-05-20 DIAGNOSIS — Z95828 Presence of other vascular implants and grafts: Secondary | ICD-10-CM

## 2012-05-20 DIAGNOSIS — D649 Anemia, unspecified: Secondary | ICD-10-CM

## 2012-05-20 DIAGNOSIS — E538 Deficiency of other specified B group vitamins: Secondary | ICD-10-CM

## 2012-05-20 LAB — COMPREHENSIVE METABOLIC PANEL
ALT: 14 U/L (ref 0–35)
AST: 23 U/L (ref 0–37)
Albumin: 3.6 g/dL (ref 3.5–5.2)
Chloride: 102 mEq/L (ref 96–112)
Creatinine, Ser: 1.82 mg/dL — ABNORMAL HIGH (ref 0.50–1.10)
Sodium: 137 mEq/L (ref 135–145)
Total Bilirubin: 0.4 mg/dL (ref 0.3–1.2)

## 2012-05-20 MED ORDER — ZOLEDRONIC ACID 4 MG/5ML IV CONC
2.0000 mg | Freq: Once | INTRAVENOUS | Status: AC
  Administered 2012-05-20: 2 mg via INTRAVENOUS
  Filled 2012-05-20: qty 2.5

## 2012-05-20 MED ORDER — DARBEPOETIN ALFA-POLYSORBATE 500 MCG/ML IJ SOLN
500.0000 ug | Freq: Once | INTRAMUSCULAR | Status: AC
  Administered 2012-05-20: 500 ug via SUBCUTANEOUS

## 2012-05-20 MED ORDER — DARBEPOETIN ALFA-POLYSORBATE 500 MCG/ML IJ SOLN
INTRAMUSCULAR | Status: AC
  Filled 2012-05-20: qty 1

## 2012-05-20 MED ORDER — SODIUM CHLORIDE 0.9 % IV SOLN
Freq: Once | INTRAVENOUS | Status: AC
  Administered 2012-05-20: 20 mL via INTRAVENOUS

## 2012-05-20 MED ORDER — CYANOCOBALAMIN 1000 MCG/ML IJ SOLN
1000.0000 ug | Freq: Once | INTRAMUSCULAR | Status: AC
  Administered 2012-05-20: 1000 ug via SUBCUTANEOUS

## 2012-05-20 MED ORDER — CYANOCOBALAMIN 1000 MCG/ML IJ SOLN
INTRAMUSCULAR | Status: AC
  Filled 2012-05-20: qty 1

## 2012-05-20 MED ORDER — HEPARIN SOD (PORK) LOCK FLUSH 100 UNIT/ML IV SOLN
500.0000 [IU] | Freq: Once | INTRAVENOUS | Status: AC
  Administered 2012-05-20: 500 [IU] via INTRAVENOUS
  Filled 2012-05-20: qty 5

## 2012-05-20 NOTE — Progress Notes (Signed)
.  [name] presents today for injection per MD orders. Aranesp 500 mcg administered SQ in right Abdomen. Administration without incident. Patient tolerated well.   [name] presents today for injection per MD orders. B12 1000 mcg administered im in left Upper Arm. Administration without incident. Patient tolerated well.   Tolerated zometa well

## 2012-05-23 ENCOUNTER — Ambulatory Visit (HOSPITAL_COMMUNITY): Payer: Medicare Other

## 2012-05-26 ENCOUNTER — Other Ambulatory Visit (HOSPITAL_COMMUNITY): Payer: Self-pay | Admitting: Oncology

## 2012-05-26 ENCOUNTER — Other Ambulatory Visit (HOSPITAL_COMMUNITY): Payer: Medicare Other

## 2012-05-26 ENCOUNTER — Telehealth (HOSPITAL_COMMUNITY): Payer: Self-pay | Admitting: *Deleted

## 2012-05-26 ENCOUNTER — Telehealth (HOSPITAL_COMMUNITY): Payer: Self-pay | Admitting: Oncology

## 2012-05-26 DIAGNOSIS — C9000 Multiple myeloma not having achieved remission: Secondary | ICD-10-CM

## 2012-05-26 DIAGNOSIS — G8929 Other chronic pain: Secondary | ICD-10-CM

## 2012-05-26 MED ORDER — MORPHINE SULFATE ER 30 MG PO TBCR: 30.0000 mg | EXTENDED_RELEASE_TABLET | Freq: Three times a day (TID) | ORAL | Status: DC

## 2012-05-26 MED ORDER — HYDROCODONE-ACETAMINOPHEN 10-325 MG PO TABS: 1.0000 | ORAL_TABLET | ORAL | Status: DC | PRN

## 2012-05-26 NOTE — Telephone Encounter (Signed)
Percocet Rx is ready.  She reports clear sputum, but does she have any fevers?  We can try a Zpak, but it may not make her feel better.

## 2012-05-26 NOTE — Telephone Encounter (Signed)
[name] would like for Jenita Seashore to call her at [phone] re: Alona Bene. She has concerns because she is doing so much worse the last few days  And questions about prognosis and what to look for.

## 2012-05-26 NOTE — Telephone Encounter (Signed)
[name] called concerned about [name].  She reports:  1. Slurring of speech 2. "Soreness" but cannot explain where 3. Hospital bed has been put up in the den 4. Bedside commode next to hospital bed 5. Did not bathe the past two days because she did not feel up to it and she was in pain. 6. Severe fatigue with inability to ambulate far at all.   So we spent some time discussing her medication, particularly the valium.  We will increase MS Contin 30 mg to TID.  She may have 2-3 break through tablets daily.  She is to take one 1 valium per day.  We also broached the topic of Hospice which certainly altered the mood of the conversation on the phone, but I suggested we start with the medications and see how she does over the next few days.  She is Hospice appropriate.  I personally reviewed and went over laboratory results with [name].  Her WBC count and platelet count are solid and within normal limits.  We discussed the increased risk of infection and hypercalcemia and renal failure.  There is no evidence of that on the lab work from 7/29.  [name]

## 2012-05-29 ENCOUNTER — Emergency Department (HOSPITAL_COMMUNITY): Payer: Medicare Other

## 2012-05-29 ENCOUNTER — Encounter (HOSPITAL_COMMUNITY): Payer: Self-pay | Admitting: Emergency Medicine

## 2012-05-29 ENCOUNTER — Other Ambulatory Visit (HOSPITAL_COMMUNITY): Payer: Self-pay | Admitting: Oncology

## 2012-05-29 ENCOUNTER — Inpatient Hospital Stay (HOSPITAL_COMMUNITY)
Admission: EM | Admit: 2012-05-29 | Discharge: 2012-05-30 | DRG: 683 | Disposition: A | Discharge status: Skilled Nursing Facility | Payer: Medicare Other | Attending: Internal Medicine | Admitting: Internal Medicine

## 2012-05-29 DIAGNOSIS — E876 Hypokalemia: Secondary | ICD-10-CM

## 2012-05-29 DIAGNOSIS — D630 Anemia in neoplastic disease: Secondary | ICD-10-CM | POA: Diagnosis present

## 2012-05-29 DIAGNOSIS — B372 Candidiasis of skin and nail: Secondary | ICD-10-CM

## 2012-05-29 DIAGNOSIS — R627 Adult failure to thrive: Secondary | ICD-10-CM | POA: Diagnosis present

## 2012-05-29 DIAGNOSIS — I10 Essential (primary) hypertension: Secondary | ICD-10-CM

## 2012-05-29 DIAGNOSIS — I129 Hypertensive chronic kidney disease with stage 1 through stage 4 chronic kidney disease, or unspecified chronic kidney disease: Secondary | ICD-10-CM | POA: Diagnosis present

## 2012-05-29 DIAGNOSIS — G8929 Other chronic pain: Secondary | ICD-10-CM | POA: Diagnosis present

## 2012-05-29 DIAGNOSIS — R531 Weakness: Secondary | ICD-10-CM | POA: Diagnosis present

## 2012-05-29 DIAGNOSIS — W19XXXA Unspecified fall, initial encounter: Secondary | ICD-10-CM | POA: Diagnosis present

## 2012-05-29 DIAGNOSIS — S32599A Other specified fracture of unspecified pubis, initial encounter for closed fracture: Secondary | ICD-10-CM

## 2012-05-29 DIAGNOSIS — G629 Polyneuropathy, unspecified: Secondary | ICD-10-CM

## 2012-05-29 DIAGNOSIS — S0083XA Contusion of other part of head, initial encounter: Secondary | ICD-10-CM

## 2012-05-29 DIAGNOSIS — T40605A Adverse effect of unspecified narcotics, initial encounter: Secondary | ICD-10-CM | POA: Diagnosis present

## 2012-05-29 DIAGNOSIS — S32591A Other specified fracture of right pubis, initial encounter for closed fracture: Secondary | ICD-10-CM | POA: Diagnosis present

## 2012-05-29 DIAGNOSIS — Y92009 Unspecified place in unspecified non-institutional (private) residence as the place of occurrence of the external cause: Secondary | ICD-10-CM

## 2012-05-29 DIAGNOSIS — N179 Acute kidney failure, unspecified: Principal | ICD-10-CM | POA: Diagnosis present

## 2012-05-29 DIAGNOSIS — D510 Vitamin B12 deficiency anemia due to intrinsic factor deficiency: Secondary | ICD-10-CM

## 2012-05-29 DIAGNOSIS — S32509A Unspecified fracture of unspecified pubis, initial encounter for closed fracture: Secondary | ICD-10-CM | POA: Diagnosis present

## 2012-05-29 DIAGNOSIS — K219 Gastro-esophageal reflux disease without esophagitis: Secondary | ICD-10-CM | POA: Diagnosis present

## 2012-05-29 DIAGNOSIS — C9000 Multiple myeloma not having achieved remission: Secondary | ICD-10-CM | POA: Diagnosis present

## 2012-05-29 DIAGNOSIS — E669 Obesity, unspecified: Secondary | ICD-10-CM

## 2012-05-29 DIAGNOSIS — IMO0002 Reserved for concepts with insufficient information to code with codable children: Secondary | ICD-10-CM

## 2012-05-29 DIAGNOSIS — R001 Bradycardia, unspecified: Secondary | ICD-10-CM

## 2012-05-29 DIAGNOSIS — T887XXA Unspecified adverse effect of drug or medicament, initial encounter: Secondary | ICD-10-CM

## 2012-05-29 DIAGNOSIS — N183 Chronic kidney disease, stage 3 unspecified: Secondary | ICD-10-CM | POA: Diagnosis present

## 2012-05-29 DIAGNOSIS — R509 Fever, unspecified: Secondary | ICD-10-CM

## 2012-05-29 DIAGNOSIS — S32592A Other specified fracture of left pubis, initial encounter for closed fracture: Secondary | ICD-10-CM

## 2012-05-29 DIAGNOSIS — D638 Anemia in other chronic diseases classified elsewhere: Secondary | ICD-10-CM

## 2012-05-29 DIAGNOSIS — I429 Cardiomyopathy, unspecified: Secondary | ICD-10-CM | POA: Diagnosis present

## 2012-05-29 DIAGNOSIS — D696 Thrombocytopenia, unspecified: Secondary | ICD-10-CM

## 2012-05-29 DIAGNOSIS — R739 Hyperglycemia, unspecified: Secondary | ICD-10-CM

## 2012-05-29 DIAGNOSIS — G609 Hereditary and idiopathic neuropathy, unspecified: Secondary | ICD-10-CM | POA: Diagnosis present

## 2012-05-29 DIAGNOSIS — E871 Hypo-osmolality and hyponatremia: Secondary | ICD-10-CM

## 2012-05-29 DIAGNOSIS — J439 Emphysema, unspecified: Secondary | ICD-10-CM

## 2012-05-29 DIAGNOSIS — J9801 Acute bronchospasm: Secondary | ICD-10-CM

## 2012-05-29 DIAGNOSIS — D649 Anemia, unspecified: Secondary | ICD-10-CM

## 2012-05-29 DIAGNOSIS — J189 Pneumonia, unspecified organism: Secondary | ICD-10-CM

## 2012-05-29 DIAGNOSIS — R5381 Other malaise: Secondary | ICD-10-CM

## 2012-05-29 DIAGNOSIS — J209 Acute bronchitis, unspecified: Secondary | ICD-10-CM

## 2012-05-29 DIAGNOSIS — M545 Low back pain: Secondary | ICD-10-CM

## 2012-05-29 DIAGNOSIS — R609 Edema, unspecified: Secondary | ICD-10-CM

## 2012-05-29 DIAGNOSIS — E538 Deficiency of other specified B group vitamins: Secondary | ICD-10-CM

## 2012-05-29 DIAGNOSIS — M549 Dorsalgia, unspecified: Secondary | ICD-10-CM | POA: Diagnosis present

## 2012-05-29 DIAGNOSIS — Z6841 Body Mass Index (BMI) 40.0 and over, adult: Secondary | ICD-10-CM

## 2012-05-29 DIAGNOSIS — M810 Age-related osteoporosis without current pathological fracture: Secondary | ICD-10-CM | POA: Diagnosis present

## 2012-05-29 DIAGNOSIS — N390 Urinary tract infection, site not specified: Secondary | ICD-10-CM | POA: Diagnosis present

## 2012-05-29 DIAGNOSIS — Z8701 Personal history of pneumonia (recurrent): Secondary | ICD-10-CM

## 2012-05-29 DIAGNOSIS — J438 Other emphysema: Secondary | ICD-10-CM | POA: Diagnosis present

## 2012-05-29 DIAGNOSIS — Z79899 Other long term (current) drug therapy: Secondary | ICD-10-CM

## 2012-05-29 DIAGNOSIS — N182 Chronic kidney disease, stage 2 (mild): Secondary | ICD-10-CM | POA: Diagnosis present

## 2012-05-29 LAB — URINALYSIS, ROUTINE W REFLEX MICROSCOPIC
Glucose, UA: NEGATIVE mg/dL
Hgb urine dipstick: NEGATIVE
Nitrite: POSITIVE — AB
Specific Gravity, Urine: 1.02 (ref 1.005–1.030)
pH: 5.5 (ref 5.0–8.0)

## 2012-05-29 LAB — URINE MICROSCOPIC-ADD ON

## 2012-05-29 LAB — CBC WITH DIFFERENTIAL/PLATELET
Basophils Absolute: 0 10*3/uL (ref 0.0–0.1)
Eosinophils Relative: 1 % (ref 0–5)
HCT: 29.4 % — ABNORMAL LOW (ref 36.0–46.0)
Lymphocytes Relative: 4 % — ABNORMAL LOW (ref 12–46)
Lymphs Abs: 0.4 10*3/uL — ABNORMAL LOW (ref 0.7–4.0)
MCV: 101 fL — ABNORMAL HIGH (ref 78.0–100.0)
Neutro Abs: 7.4 10*3/uL (ref 1.7–7.7)
Platelets: 203 10*3/uL (ref 150–400)
RBC: 2.91 MIL/uL — ABNORMAL LOW (ref 3.87–5.11)
RDW: 17 % — ABNORMAL HIGH (ref 11.5–15.5)
WBC: 8.7 10*3/uL (ref 4.0–10.5)

## 2012-05-29 LAB — TROPONIN I: Troponin I: <0.3 ng/mL (ref <0.30)

## 2012-05-29 LAB — HEPATIC FUNCTION PANEL
ALT: 11 U/L (ref 0–35)
Bilirubin, Direct: 0.1 mg/dL (ref 0.0–0.3)
Indirect Bilirubin: 0.2 mg/dL — ABNORMAL LOW (ref 0.3–0.9)
Total Protein: 7.2 g/dL (ref 6.0–8.3)

## 2012-05-29 LAB — BASIC METABOLIC PANEL
CO2: 29 mEq/L (ref 19–32)
Calcium: 10.5 mg/dL (ref 8.4–10.5)
Chloride: 99 mEq/L (ref 96–112)
Glucose, Bld: 105 mg/dL — ABNORMAL HIGH (ref 70–99)
Sodium: 137 mEq/L (ref 135–145)

## 2012-05-29 MED ORDER — ENOXAPARIN SODIUM 30 MG/0.3ML ~~LOC~~ SOLN
30.0000 mg | SUBCUTANEOUS | Status: DC
  Administered 2012-05-29: 30 mg via SUBCUTANEOUS
  Filled 2012-05-29: qty 0.3

## 2012-05-29 MED ORDER — VITAMIN D 1000 UNITS PO TABS
1000.0000 [IU] | ORAL_TABLET | Freq: Every day | ORAL | Status: DC
  Administered 2012-05-29 – 2012-05-30 (×2): 1000 [IU] via ORAL
  Filled 2012-05-29 (×3): qty 1

## 2012-05-29 MED ORDER — PANTOPRAZOLE SODIUM 40 MG PO TBEC
40.0000 mg | DELAYED_RELEASE_TABLET | Freq: Every day | ORAL | Status: DC
  Administered 2012-05-30: 40 mg via ORAL
  Filled 2012-05-29 (×2): qty 1

## 2012-05-29 MED ORDER — ACETAMINOPHEN 650 MG RE SUPP: 650.0000 mg | Freq: Four times a day (QID) | RECTAL | Status: DC | PRN

## 2012-05-29 MED ORDER — ONDANSETRON HCL 4 MG PO TABS: 4.0000 mg | ORAL_TABLET | Freq: Four times a day (QID) | ORAL | Status: DC | PRN

## 2012-05-29 MED ORDER — SODIUM CHLORIDE 0.9 % IV BOLUS (SEPSIS)
500.0000 mL | Freq: Once | INTRAVENOUS | Status: AC
  Administered 2012-05-29: 500 mL via INTRAVENOUS

## 2012-05-29 MED ORDER — SPIRONOLACTONE 25 MG PO TABS
50.0000 mg | ORAL_TABLET | Freq: Two times a day (BID) | ORAL | Status: DC
  Administered 2012-05-30: 50 mg via ORAL
  Filled 2012-05-29 (×2): qty 2

## 2012-05-29 MED ORDER — BACITRACIN-NEOMYCIN-POLYMYXIN 400-5-5000 EX OINT
TOPICAL_OINTMENT | CUTANEOUS | Status: AC
  Administered 2012-05-29: 14:00:00 via TRANSDERMAL
  Filled 2012-05-29: qty 1

## 2012-05-29 MED ORDER — DEXTROSE 5 % IV SOLN
1.0000 g | INTRAVENOUS | Status: DC
  Filled 2012-05-29 (×3): qty 10

## 2012-05-29 MED ORDER — GUAIFENESIN ER 600 MG PO TB12
1200.0000 mg | ORAL_TABLET | Freq: Two times a day (BID) | ORAL | Status: DC
  Administered 2012-05-29 – 2012-05-30 (×2): 1200 mg via ORAL
  Filled 2012-05-29 (×3): qty 2

## 2012-05-29 MED ORDER — ACETAMINOPHEN 325 MG PO TABS: 650.0000 mg | ORAL_TABLET | Freq: Four times a day (QID) | ORAL | Status: DC | PRN

## 2012-05-29 MED ORDER — ONDANSETRON HCL 4 MG/2ML IJ SOLN: 4.0000 mg | Freq: Four times a day (QID) | INTRAMUSCULAR | Status: DC | PRN

## 2012-05-29 MED ORDER — ALUM & MAG HYDROXIDE-SIMETH 200-200-20 MG/5ML PO SUSP: 30.0000 mL | Freq: Four times a day (QID) | ORAL | Status: DC | PRN

## 2012-05-29 MED ORDER — SODIUM CHLORIDE 0.9 % IV SOLN: INTRAVENOUS | Status: DC

## 2012-05-29 MED ORDER — OXYCODONE HCL 10 MG PO TB12
10.0000 mg | ORAL_TABLET | Freq: Two times a day (BID) | ORAL | Status: DC
  Administered 2012-05-29 – 2012-05-30 (×2): 10 mg via ORAL
  Filled 2012-05-29 (×2): qty 1

## 2012-05-29 MED ORDER — DIAZEPAM 5 MG PO TABS: 5.0000 mg | ORAL_TABLET | Freq: Four times a day (QID) | ORAL | Status: DC | PRN

## 2012-05-29 MED ORDER — HYDROMORPHONE HCL PF 1 MG/ML IJ SOLN
0.5000 mg | Freq: Once | INTRAMUSCULAR | Status: AC
  Administered 2012-05-29: 0.5 mg via INTRAVENOUS
  Filled 2012-05-29: qty 1

## 2012-05-29 MED ORDER — FUROSEMIDE 20 MG PO TABS
20.0000 mg | ORAL_TABLET | Freq: Every day | ORAL | Status: DC
  Administered 2012-05-30: 20 mg via ORAL
  Filled 2012-05-29 (×2): qty 1

## 2012-05-29 MED ORDER — DEXTROSE 5 % IV SOLN
1.0000 g | Freq: Once | INTRAVENOUS | Status: AC
  Administered 2012-05-29: 17:00:00 via INTRAVENOUS
  Filled 2012-05-29: qty 10

## 2012-05-29 MED ORDER — LIDOCAINE-EPINEPHRINE-TETRACAINE (LET) SOLUTION
NASAL | Status: AC
  Administered 2012-05-29: 11:00:00
  Filled 2012-05-29: qty 3

## 2012-05-29 MED ORDER — POLYSACCHARIDE IRON COMPLEX 150 MG PO CAPS
150.0000 mg | ORAL_CAPSULE | ORAL | Status: DC
  Administered 2012-05-30: 150 mg via ORAL
  Filled 2012-05-29 (×2): qty 1

## 2012-05-29 MED ORDER — ALBUTEROL SULFATE (5 MG/ML) 0.5% IN NEBU: 2.5000 mg | INHALATION_SOLUTION | Freq: Four times a day (QID) | RESPIRATORY_TRACT | Status: DC | PRN

## 2012-05-29 MED ORDER — OXYCODONE HCL 5 MG PO TABS
5.0000 mg | ORAL_TABLET | ORAL | Status: DC | PRN
  Administered 2012-05-30 (×2): 10 mg via ORAL
  Filled 2012-05-29 (×2): qty 2

## 2012-05-29 MED ORDER — SODIUM CHLORIDE 0.9 % IV SOLN
INTRAVENOUS | Status: DC
  Administered 2012-05-29: 17:00:00 via INTRAVENOUS

## 2012-05-29 MED ORDER — FOLIC ACID 1 MG PO TABS
1.0000 mg | ORAL_TABLET | Freq: Every day | ORAL | Status: DC
  Administered 2012-05-29 – 2012-05-30 (×2): 1 mg via ORAL
  Filled 2012-05-29 (×3): qty 1

## 2012-05-29 MED ORDER — SENNA 8.6 MG PO TABS
1.0000 | ORAL_TABLET | Freq: Two times a day (BID) | ORAL | Status: DC
  Administered 2012-05-29 – 2012-05-30 (×2): 8.6 mg via ORAL
  Filled 2012-05-29 (×3): qty 1

## 2012-05-29 NOTE — ED Notes (Signed)
Pt to CT

## 2012-05-29 NOTE — ED Provider Notes (Signed)
History   This chart was scribed for Donnetta Hutching, MD by Charolett Bumpers . The patient was seen in room APA18/APA18. Patient's care was started at 0907.    CSN: [id number]  Arrival date & time 05/29/12  4098   First MD Initiated Contact with Patient 05/29/12 [phone]      Chief Complaint  Patient presents with  . Weakness    (Consider location/radiation/quality/duration/timing/severity/associated sxs/prior treatment) HPI [name] is a 78 y.o. female who presents to the Emergency Department complaining of constant, moderate generalized weakness for the past 2 days. Pt reports that she was recently released from rehab after a fall and fractured pelvis 2 weeks ago. Pt states that the generalized weakness started yesterday and worsened this morning with waking. Pt reports that she also fell this morning due to the weakness, falling on her left knee. Pt denies any chest pain, SOB or dysuria. Pt reports a h/o myeloma for the past 6 years. Pt states that she currently lives at home with daughter.   Oncologist: Dr. Laurie Panda PCP: Dr. Regino Schultze    Past Medical History  Diagnosis Date  . Essential hypertension, benign     LVH; RVH; normal EF in 04/2011 with the basilar inferior wall akinesis  . Edema   . Obesity   . Osteoporosis     Lumbar and thoracic compression fractures  . Multiple myeloma 2007    IgA lambda  . Gastroesophageal reflux disease   . Pernicious anemia     Plus thrombocytopenia;H./H.-9.8/29 in 12/2010  . Chronic kidney disease, stage 2, mildly decreased GFR     Creatinine of 1.3 in 09/2010; acute renal failure in 11/2010 secondary to diuretics with hyperkalemia secondary to Spiriva lactone  . Peripheral neuropathy     Complicating chemotherapy  . Chronic low back pain   . Chronic steroid use   . History of candidiasis   . Allergy     Revlimid  . Anemia of chronic disease   . B12 deficiency   . Hyperglycemia, drug-induced 03/09/2012  . Fracture Of Multiple Pubic  Rami 03/08/2012  . Thrombocytopenia 03/10/2012  . Emphysema     Past Surgical History  Procedure Date  . Total abdominal hysterectomy w/ bilateral salpingoophorectomy     Incidental appendectomy  . Rotator cuff repair     Left  . Vein surgery     Ligation and stripping bilaterally  . Orif ankle fracture     Right  . Carpal tunnel release     Right  . Central venous catheter tunneled insertion single lumen 2012    Family History  Problem Relation Age of Onset  . Heart attack Brother     History  Substance Use Topics  . Smoking status: Never Smoker   . Smokeless tobacco: Never Used  . Alcohol Use: No    OB History    Grav Para Term Preterm Abortions TAB SAB Ect Mult Living                  Review of Systems A complete 10 system review of systems was obtained and all systems are negative except as noted in the HPI and PMH.   Allergies  Lenalidomide  Home Medications   Current Outpatient Rx  Name Route Sig Dispense Refill  . ALBUTEROL SULFATE (2.5 MG/3ML) 0.083% IN NEBU Nebulization Take 2.5 mg by nebulization every 6 (six) hours as needed. For shortness of breath/wheezing    . VITAMIN D 1000 UNITS PO TABS Oral Take  1,000 Units by mouth daily.    Marland Kitchen DIAZEPAM 5 MG PO TABS Oral Take 1 tablet (5 mg total) by mouth every 6 (six) hours as needed for anxiety or sleep. For anxiety 30 tablet 0  . DOCUSATE SODIUM 100 MG PO CAPS Oral Take 100 mg by mouth every Monday, Wednesday, and Friday. Takes in the evening    . FOLIC ACID 1 MG PO TABS Oral Take 1 mg by mouth daily.      . FUROSEMIDE 20 MG PO TABS Oral Take 1 tablet (20 mg total) by mouth daily. 30 tablet 0  . GUAIFENESIN ER 600 MG PO TB12 Oral Take 2 tablets (1,200 mg total) by mouth 2 (two) times daily. 14 tablet 0  . POLYSACCHARIDE IRON COMPLEX 150 MG PO CAPS Oral Take 150 mg by mouth every Monday, Wednesday, and Friday.     . MORPHINE SULFATE ER 30 MG PO TBCR Oral Take 30 mg by mouth 2 (two) times daily.    Marland Kitchen  OMEPRAZOLE 20 MG PO CPDR Oral Take 1 capsule (20 mg total) by mouth daily. 30 capsule 4  . SPIRONOLACTONE 50 MG PO TABS Oral Take 50 mg by mouth 3 (three) times daily.      BP 115/61  Pulse 88  Temp 98.4 F (36.9 C) (Oral)  Resp 19  SpO2 93%  Physical Exam  Nursing note and vitals reviewed. Constitutional: She is oriented to person, place, and time. She appears well-developed and well-nourished. No distress.       Overweight.   HENT:  Head: Normocephalic and atraumatic.  Eyes: EOM are normal. Pupils are equal, round, and reactive to light.  Neck: Normal range of motion. Neck supple. No tracheal deviation present.  Cardiovascular: Normal rate.   Pulmonary/Chest: Effort normal. No respiratory distress.  Abdominal: Soft. She exhibits no distension.  Musculoskeletal: Normal range of motion. She exhibits edema.       Erythema and edema in left inferior lateral knee area.     Neurological: She is alert and oriented to person, place, and time. No sensory deficit.       Alert and oriented X3. Moves all extremities but sluggishly. Mental status normal  Skin: Skin is warm and dry.  Psychiatric: She has a normal mood and affect. Her behavior is normal.    ED Course  Procedures (including critical care time)  DIAGNOSTIC STUDIES: Oxygen Saturation is 93% on room air, adequate by my interpretation.    COORDINATION OF CARE:  09:30-Discussed planned course of treatment with the patient including UA, blood work, chest x-ray and cardiac monitoring, who is agreeable at this time.     Labs Reviewed  CBC WITH DIFFERENTIAL - Abnormal; Notable for the following:    RBC 2.91 (*)     Hemoglobin 9.5 (*)     HCT 29.4 (*)     MCV 101.0 (*)     RDW 17.0 (*)     Neutrophils Relative 85 (*)     Lymphocytes Relative 4 (*)     Lymphs Abs 0.4 (*)     All other components within normal limits  BASIC METABOLIC PANEL - Abnormal; Notable for the following:    Glucose, Bld 105 (*)     BUN 30 (*)       Creatinine, Ser 2.13 (*)     GFR calc non Af Amer 21 (*)     GFR calc Af Amer 25 (*)     All other components within normal limits  URINALYSIS, ROUTINE W REFLEX MICROSCOPIC - Abnormal; Notable for the following:    Nitrite POSITIVE (*)     Leukocytes, UA SMALL (*)     All other components within normal limits  HEPATIC FUNCTION PANEL - Abnormal; Notable for the following:    Indirect Bilirubin 0.2 (*)     All other components within normal limits  URINE MICROSCOPIC-ADD ON - Abnormal; Notable for the following:    Squamous Epithelial / LPF MANY (*)     Bacteria, UA MANY (*)     All other components within normal limits  TROPONIN I  URINE CULTURE    Dg Lumbar Spine Complete  05/29/2012  *RADIOLOGY REPORT*  Clinical Data: Fall.  Weakness.  LUMBAR SPINE - COMPLETE 4+ VIEW  Comparison: 06/09/2010  Findings: A left acetabular fracture is present.  Abnormal thickening of the left arcuate lines of the pelvis suggest left sacral fracture. Bilateral pubic ramus fractures noted.  Vertebra plana noted at L3, essentially stable from the prior exam. Prominent superior endplate compression fracture at L2 appears similar.  Superior endplate compression of L4 is likewise similar, and there is chronic wedging at T12.  IMPRESSION:  1.  Stable lumbar spine compression fractures. 2.  Fractures of the left acetabulum, bilateral pubic rami, and left sacrum.  Original Report Authenticated By: Dellia Cloud, M.D.   Dg Pelvis 1-2 Views  05/29/2012  *RADIOLOGY REPORT*  Clinical Data: Fall.  Weakness.  PELVIS - 1-2 VIEW  Comparison: 03/08/2012  Findings: Healing response along the left inferior and superior pubic ramus fractures noted.  There are also abnormal band of sclerosis in the right inferior and superior pubic rami potentially reflecting new or originally occult fractures.  In addition, there is new abnormal sclerosis along the medial portion of the left acetabulum which could likewise represent fracture.   Sclerosis along the left sacral arcuate lines may be a secondary sign of left sacral fracture.  IMPRESSION:  1.  Irregular healing fractures of the left pubic rami. 2.  Newly apparent fractures of the right superior and inferior pubic ramus, and possible fracture involving the left acetabulum. 3.  Thickened appearing left arcuate lines may reflect left sacral fracture.  Original Report Authenticated By: Dellia Cloud, M.D.   Ct Hip Left Wo Contrast  05/29/2012  *RADIOLOGY REPORT*  Clinical Data: Left hip pain status post fall.  Question left acetabular fracture.  CT OF THE LEFT HIP WITHOUT CONTRAST  Technique:  Multidetector CT imaging of the left hip was performed according to the standard protocol. Multiplanar CT image reconstructions were also generated.  Comparison: Pelvic radiographs 05/29/2012.  Left hip radiographs 03/08/2012.  Findings: This study is limited to the left hip and inferior left pelvis.  The entire pelvis is not imaged.  As demonstrated radiographically, there is exuberant callus surrounding healing fractures of the left superior and inferior pubic rami.  The left superior pubic ramus fracture extends into the anterior medial walls of the left acetabulum.  These fractures are incompletely healed.  Right parasymphyseal fractures are partially imaged and appear nondisplaced.  There is no diastasis of the symphysis pubis.  There is sclerosis and anterior cortical irregularity in the visualized inferior left sacrum consistent with a healing fracture. Most of the sacrum is not imaged on this examination of the left hip.  The inferior aspect of the left sacroiliac joint appears normal.  There is no evidence of proximal left femur fracture or avascular necrosis.  There is no evidence of significant hematoma  surrounding the fractures.  Moderate stool is noted within the rectum.  IMPRESSION:  1.  Healing fractures of the left hemi pelvis as described.  The superior pubic rami fracture extends  laterally to involve the anterior medial walls of the left acetabulum. 2.  Healing fracture of the left sacrum is partially imaged.  This study does not include the entire pelvis. 3.  No evidence of proximal left femur fracture. 4.  No significant hematoma demonstrated.  Original Report Authenticated By: Gerrianne Scale, M.D.   Dg Chest Port 1 View  05/29/2012  *RADIOLOGY REPORT*  Clinical Data: Weakness.  Fall.  PORTABLE CHEST - 1 VIEW  Comparison: 04/17/2012  Findings: Stable mild cardiomegaly noted with tortuosity of the thoracic aorta.  Right-sided Port-A-Cath noted with tip projecting over the SVC.  No pulmonary edema is observed.  The lungs appear grossly clear. Thoracic spondylosis noted.  IMPRESSION:  1.  Cardiomegaly, without edema. 2.  Tortuous thoracic aorta. 3.  Overall stable appearance of the chest.  Original Report Authenticated By: Dellia Cloud, M.D.   Dg Knee Complete 4 Views Left  05/29/2012  *RADIOLOGY REPORT*  Clinical Data: Fall.  LEFT KNEE - COMPLETE 4+ VIEW  Comparison: None.  Findings: Exaggerated convex contour of the proximal tibial metaphysis medially is not characteristic for fracture but could represent a sessile osteochondroma.  Mild degenerative loss of medial compartmental articular space is present.  Bony demineralization noted.  Equivocal appearance for knee effusion in the suprapatellar bursa.  No fracture observed.  IMPRESSION:  1.  Equivocal knee effusion. 2.  Suspected sessile osteochondroma of the medial metaphysis of the proximal tibia.  Original Report Authenticated By: Dellia Cloud, M.D.   No results found.   No diagnosis found.  Date: 05/29/2012  Rate: 76  Rhythm: normal sinus rhythm  QRS Axis: normal  Intervals: normal  ST/T Wave abnormalities: normal  Conduction Disutrbances:left bundle branch block  Narrative Interpretation:   Old EKG Reviewed: changes noted    MDM   New and old pelvic fractures. Urine sample shows evidence of  infection. Patient is unable to care for self. Admit.  I personally performed the services described in this documentation, which was scribed in my presence. The recorded information has been reviewed and considered.       Donnetta Hutching, MD 05/29/12 [phone]

## 2012-05-29 NOTE — ED Notes (Signed)
Pt d/c from hospital due to fall and fx pelvis x 2 weeks ago. Here today due to weakness since. Pt is alert/oriented. Sore still from fall. Nad.

## 2012-05-29 NOTE — Clinical Social Work Note (Signed)
CSW consulted by MD regarding family needs.  Met w son and patient at bedside in ED. Patient suffers from multiple myeloma, severe osteoporosis and "severe bone deterioration."  Patient is cared for at home by son and daughter.  Patient uses [facility], and has medical equipment and home PT.  Patient is being treated by oncologist, and is not interested in hospice care at this time as she feels she is making progress in fighting her cancer.   Son explained that patient had been at [facility] in June, discharged to [facility] where she completed several weeks of rehab.  Son states that PT rehab was quite helpful, and patient made significant gains while at the SNF.  At discharge, son attempted to fill her prescriptions, one for regular pain control and another for break through pain.  Unfortunately, pharmacy filled the break through pain prescription first and insurance company denied authorization for the second prescription.  Son asked oncologist to prescribe different medication and patient was put on morphine.  Son and patient say that patient became increasingly weak and prone to falls after going on morphine.  CSW empathized w patient's situation, offered emotional support for difficulties encountered in caring for generally weak and deconditioned family member at home.  Commended family for their continued involvement and interest in patient.  Explained that their insurance would need to authorize whatever kind of care patient needs, including SNF placement if that was recommended by PT.  Family has tried to apply for Medicaid, which would pay for long term placement at SNF, but patient has assets that would need to be liquidated in order to be eligible.  Family seems to understand limitations of their insurance coverage, but still struggle w difficulty of taking care of patient at home.  Family requests EMS transport if patient is released from ER as son says he cannot physically manage to get patient in and out  of house.    CSW spoke w MD and ER RN and conveyed family's struggle w patient care.  Will consult w RN CM to see if there are any additional resources that can be offered to family if patient is discharged home.    Clovis Cao Clinical Social Worker [phone])

## 2012-05-29 NOTE — ED Notes (Signed)
Social work in with patient and family

## 2012-05-29 NOTE — Progress Notes (Addendum)
Pt's head to toe skin assessment completed upon pt's arrival to room 316. Scattered bruising noted to upper and lower extremities. Reddened areas to bilateral great toes, blanchable. Bottom reddened, blanchable. Skin is dry and intact. Skin assessment completed by Kathyrn Sheriff, RN and Dagoberto Ligas, RN.

## 2012-05-29 NOTE — H&P (Addendum)
[facility] History and Physical  NIKIRA KUSHNIR RUE:[id number] DOB: [birth year] DOA: 05/29/2012  Referring physician: Donnetta Hutching, M.D. PCP: [name], MD   Chief Complaint: Generalized weakness.  HPI:  The patient is a 69 her old woman with a history significant for multiple myeloma, previous pubic rami fractures secondary to a fall in May 2013, COPD, secondary cardiomyopathy, and recent hospitalization in June 2013 for healthcare associated pneumonia. She was recently discharged from the [facility] 2 weeks ago following rehabilitation. She presents to the emergency department today with a chief complaint of generalized weakness. She also had a fall today after standing up and her legs gave away. There was no head trauma. She had some mild pain in her pelvis. She attributes her generalized weakness to a change in her chronic pain medication from OxyContin given in the hospital and at the skilled nursing facility to MS Contin because her insurance company would not cover both OxyContin and oxycodone. She denies nausea, vomiting, diarrhea, or pain with urination. She denies subjective fever or chills.  In the emergency department, the patient is afebrile and hemodynamically stable. CT scan of her left hip revealed healing fractures. X-rays of the pelvis reveals irregular healing fractures of the left pubic rami, newly apparent fractures of the right superior and inferior pubic ramus and possible fracture involving the left acetabulum. Her lab data are significant for a creatinine of 2.13, normal troponin I., and a hemoglobin of 9.5. Her urinalysis reveals nitrite, too numerous to count WBCs, many squamous cells, and many bacteria. She is being admitted for further evaluation and management.  Review of Systems:  Positive for chronic bone pain and chronic low back pain; chronic swelling in the legs; occasional shortness of breath, generalized weakness, coughing. Otherwise review of systems  is negative.    Past Medical History  Diagnosis Date  . Essential hypertension, benign     LVH; RVH; normal EF in 04/2011 with the basilar inferior wall akinesis  . Edema   . Obesity   . Osteoporosis     Lumbar and thoracic compression fractures  . Multiple myeloma 2007    IgA lambda  . Gastroesophageal reflux disease   . Pernicious anemia     Plus thrombocytopenia;H./H.-9.8/29 in 12/2010  . Chronic kidney disease, stage 2, mildly decreased GFR     Creatinine of 1.3 in 09/2010; acute renal failure in 11/2010 secondary to diuretics with hyperkalemia secondary to Spiriva lactone  . Peripheral neuropathy     Complicating chemotherapy  . Chronic low back pain   . Chronic steroid use   . History of candidiasis   . Allergy     Revlimid  . Anemia of chronic disease   . B12 deficiency   . Hyperglycemia, drug-induced 03/09/2012  . Fracture Of Multiple Pubic Rami 03/08/2012  . Thrombocytopenia 03/10/2012  . Emphysema    Past Surgical History  Procedure Date  . Total abdominal hysterectomy w/ bilateral salpingoophorectomy     Incidental appendectomy  . Rotator cuff repair     Left  . Vein surgery     Ligation and stripping bilaterally  . Orif ankle fracture     Right  . Carpal tunnel release     Right  . Central venous catheter tunneled insertion single lumen 2012   Social History: She is married. She denies tobacco, alcohol, and illicit drug use. She had been ambulating with a walker following her stay at the [facility].  Allergies  Allergen Reactions  . Lenalidomide  Rash    Family History  Problem Relation Age of Onset  . Heart attack Brother     Prior to Admission medications   Medication Sig Start Date End Date Taking? Authorizing Provider  albuterol (PROVENTIL) (2.5 MG/3ML) 0.083% nebulizer solution Take 2.5 mg by nebulization every 6 (six) hours as needed. For shortness of breath/wheezing   Yes Historical Provider, MD  cholecalciferol (VITAMIN D) 1000 UNITS tablet  Take 1,000 Units by mouth daily.   Yes Historical Provider, MD  diazepam (VALIUM) 5 MG tablet Take 1 tablet (5 mg total) by mouth every 6 (six) hours as needed for anxiety or sleep. For anxiety 04/19/12  Yes Elliot Cousin, MD  docusate sodium (COLACE) 100 MG capsule Take 100 mg by mouth every Monday, Wednesday, and Friday. Takes in the evening   Yes Historical Provider, MD  folic acid (FOLVITE) 1 MG tablet Take 1 mg by mouth daily.     Yes Historical Provider, MD  furosemide (LASIX) 20 MG tablet Take 1 tablet (20 mg total) by mouth daily. 03/11/12  Yes Elliot Cousin, MD  guaiFENesin (MUCINEX) 600 MG 12 hr tablet Take 2 tablets (1,200 mg total) by mouth 2 (two) times daily. 03/05/12 03/05/13 Yes Erick Blinks, MD  iron polysaccharides (NU-IRON) 150 MG capsule Take 150 mg by mouth every Monday, Wednesday, and Friday.    Yes Historical Provider, MD  morphine (MS CONTIN) 30 MG 12 hr tablet Take 30 mg by mouth 2 (two) times daily. 05/26/12  Yes Ellouise Newer, PA  omeprazole (PRILOSEC) 20 MG capsule Take 1 capsule (20 mg total) by mouth daily. 05/31/11  Yes Ellouise Newer, PA  spironolactone (ALDACTONE) 50 MG tablet Take 50 mg by mouth 3 (three) times daily.   Yes Historical Provider, MD   Physical Exam: Filed Vitals:   05/29/12 1104 05/29/12 1217 05/29/12 1532 05/29/12 1809  BP: 108/50 117/65 102/48 95/68  Pulse: 75 81 75 89  Temp:   98.4 F (36.9 C) 97.6 F (36.4 C)  TempSrc:   Oral Oral  Resp: 16 16 18 20   Height:    5\' 2"  (1.575 m)  Weight:    99.6 kg (219 lb 9.3 oz)  SpO2: 95% 96% 96% 96%     General:  Obese 78 roll Caucasian woman lying in bed, in no acute distress. Head is normocephalic and nontraumatic.  Eyes: Pupils are equal, round, and reactive to light. Extraocular movements are intact. Conjunctivae are clear. Sclerae are white.  ENT: Oropharynx reveals mildly dry mucous membranes.  Neck: Supple, no adenopathy, no thyromegaly, no JVD.  Cardiovascular: S1, S2, with a soft  systolic murmur.  Respiratory: Clear to auscultation bilaterally but with decreased breath sounds in the bases.  Abdomen: Obese, positive bowel sounds, soft, nontender, nondistended.  Skin: Fair to good turgor. No rashes.  Musculoskeletal: Mild tenderness over the right and left pubic rami. No acute hot joints. Trace of pedal edema bilaterally.  Psychiatric: She is alert and oriented x3. Her affect is flat.  Neurologic: Cranial nerves II through XII are grossly intact. She is able to lift each leg against gravity approximately 15. She has a good bilateral hand grip. Sensation is grossly intact throughout.  Labs on Admission:  Basic Metabolic Panel:  Lab 05/29/12 4098  NA 137  K 4.0  CL 99  CO2 29  GLUCOSE 105*  BUN 30*  CREATININE 2.13*  CALCIUM 10.5  MG --  PHOS --   Liver Function Tests:  Lab 05/29/12 0950  AST 22  ALT 11  ALKPHOS 74  BILITOT 0.3  PROT 7.2  ALBUMIN 3.5   No results found for this basename: LIPASE:5,AMYLASE:5 in the last 168 hours No results found for this basename: AMMONIA:5 in the last 168 hours CBC:  Lab 05/29/12 0934  WBC 8.7  NEUTROABS 7.4  HGB 9.5*  HCT 29.4*  MCV 101.0*  PLT 203   Cardiac Enzymes:  Lab 05/29/12 0950  CKTOTAL --  CKMB --  CKMBINDEX --  TROPONINI <0.30    BNP (last 3 results)  Basename 03/02/12 1125  PROBNP 897.1*   CBG: No results found for this basename: GLUCAP:5 in the last 168 hours  Radiological Exams on Admission: Dg Lumbar Spine Complete  05/29/2012  *RADIOLOGY REPORT*  Clinical Data: Fall.  Weakness.  LUMBAR SPINE - COMPLETE 4+ VIEW  Comparison: 06/09/2010  Findings: A left acetabular fracture is present.  Abnormal thickening of the left arcuate lines of the pelvis suggest left sacral fracture. Bilateral pubic ramus fractures noted.  Vertebra plana noted at L3, essentially stable from the prior exam. Prominent superior endplate compression fracture at L2 appears similar.  Superior endplate  compression of L4 is likewise similar, and there is chronic wedging at T12.  IMPRESSION:  1.  Stable lumbar spine compression fractures. 2.  Fractures of the left acetabulum, bilateral pubic rami, and left sacrum.  Original Report Authenticated By: Dellia Cloud, M.D.   Dg Pelvis 1-2 Views  05/29/2012  *RADIOLOGY REPORT*  Clinical Data: Fall.  Weakness.  PELVIS - 1-2 VIEW  Comparison: 03/08/2012  Findings: Healing response along the left inferior and superior pubic ramus fractures noted.  There are also abnormal band of sclerosis in the right inferior and superior pubic rami potentially reflecting new or originally occult fractures.  In addition, there is new abnormal sclerosis along the medial portion of the left acetabulum which could likewise represent fracture.  Sclerosis along the left sacral arcuate lines may be a secondary sign of left sacral fracture.  IMPRESSION:  1.  Irregular healing fractures of the left pubic rami. 2.  Newly apparent fractures of the right superior and inferior pubic ramus, and possible fracture involving the left acetabulum. 3.  Thickened appearing left arcuate lines may reflect left sacral fracture.  Original Report Authenticated By: Dellia Cloud, M.D.   Ct Hip Left Wo Contrast  05/29/2012  *RADIOLOGY REPORT*  Clinical Data: Left hip pain status post fall.  Question left acetabular fracture.  CT OF THE LEFT HIP WITHOUT CONTRAST  Technique:  Multidetector CT imaging of the left hip was performed according to the standard protocol. Multiplanar CT image reconstructions were also generated.  Comparison: Pelvic radiographs 05/29/2012.  Left hip radiographs 03/08/2012.  Findings: This study is limited to the left hip and inferior left pelvis.  The entire pelvis is not imaged.  As demonstrated radiographically, there is exuberant callus surrounding healing fractures of the left superior and inferior pubic rami.  The left superior pubic ramus fracture extends into the  anterior medial walls of the left acetabulum.  These fractures are incompletely healed.  Right parasymphyseal fractures are partially imaged and appear nondisplaced.  There is no diastasis of the symphysis pubis.  There is sclerosis and anterior cortical irregularity in the visualized inferior left sacrum consistent with a healing fracture. Most of the sacrum is not imaged on this examination of the left hip.  The inferior aspect of the left sacroiliac joint appears normal.  There is no evidence of proximal left  femur fracture or avascular necrosis.  There is no evidence of significant hematoma surrounding the fractures.  Moderate stool is noted within the rectum.  IMPRESSION:  1.  Healing fractures of the left hemi pelvis as described.  The superior pubic rami fracture extends laterally to involve the anterior medial walls of the left acetabulum. 2.  Healing fracture of the left sacrum is partially imaged.  This study does not include the entire pelvis. 3.  No evidence of proximal left femur fracture. 4.  No significant hematoma demonstrated.  Original Report Authenticated By: Gerrianne Scale, M.D.   Dg Chest Port 1 View  05/29/2012  *RADIOLOGY REPORT*  Clinical Data: Weakness.  Fall.  PORTABLE CHEST - 1 VIEW  Comparison: 04/17/2012  Findings: Stable mild cardiomegaly noted with tortuosity of the thoracic aorta.  Right-sided Port-A-Cath noted with tip projecting over the SVC.  No pulmonary edema is observed.  The lungs appear grossly clear. Thoracic spondylosis noted.  IMPRESSION:  1.  Cardiomegaly, without edema. 2.  Tortuous thoracic aorta. 3.  Overall stable appearance of the chest.  Original Report Authenticated By: Dellia Cloud, M.D.   Dg Knee Complete 4 Views Left  05/29/2012  *RADIOLOGY REPORT*  Clinical Data: Fall.  LEFT KNEE - COMPLETE 4+ VIEW  Comparison: None.  Findings: Exaggerated convex contour of the proximal tibial metaphysis medially is not characteristic for fracture but could  represent a sessile osteochondroma.  Mild degenerative loss of medial compartmental articular space is present.  Bony demineralization noted.  Equivocal appearance for knee effusion in the suprapatellar bursa.  No fracture observed.  IMPRESSION:  1.  Equivocal knee effusion. 2.  Suspected sessile osteochondroma of the medial metaphysis of the proximal tibia.  Original Report Authenticated By: Dellia Cloud, M.D.    EKG: Normal sinus rhythm, left bundle branch block, heart rate 76 beats per minute.  Assessment/Plan Principal Problem:  *Weakness generalized Active Problems:  UTI (urinary tract infection)  Multiple myeloma  Chronic kidney disease, stage 2, mildly decreased GFR  Secondary cardiomyopathy, unspecified  Fall  Bilateral pubic rami fractures  Medication side effect   The patient presents with generalized weakness, which is likely multifactorial in origin. Apparently, her chronic opiate therapy was changed which she attributes to the beginning of her weakness. When she started taking MS Contin at 30 mg 2 times daily, she was barely able to function according to her son. She had been doing well on OxyContin 10 mg twice a day and as needed oxycodone for breakthrough pain. However, her insurance would not pay for both OxyContin and oxycodone. She also has acute renal failure superimposed on chronic kidney disease. At the time of discharge during the previous hospitalization, her creatinine was 1.26. Today it is 2.13. She is chronically treated with Aldactone and Lasix, but Aldactone dosing was recently increased for treatment of lower extremity edema. Her urinalysis reveals many bacteria, white blood cells, and many squamous cells, however, in light of her recent hospitalizations, she will be treated empirically for a urinary tract infection. She is also noted to have a probable new right pubic rami fracture which may have been the result of her fall earlier today at home. The fractures  on the left, are healing radiographically.    Plan:  1. The patient was given Rocephin in the emergency department. We'll continue it for treatment of the urinary tract infection. 2. We'll hold Lasix and Aldactone tonight. We'll decrease the Aldactone to twice a day rather than 3 times a  day. Will start gentle hydration. We'll follow her renal function closely. 3. Will restart chronic pain management with OxyContin and oxycodone when necessary while she's here. 4. We'll consult the physical and respiratory therapists. 5. Consult oncology as needed.  Code Status: Full code Family Communication: Discussed with patient and son. Disposition Plan: To be determined  Time spent: Greater than 1 hour  [facility] Pager [phone]  If 7PM-7AM, please contact night-coverage [email or web address] Password [email or web address] 05/29/2012, 6:18 PM

## 2012-05-29 NOTE — ED Notes (Addendum)
Patient resting comfortably.  Advised patient again that we need urine specimen

## 2012-05-30 ENCOUNTER — Inpatient Hospital Stay
Admission: RE | Admit: 2012-05-30 | Discharge: 2012-06-21 | Disposition: A | Discharge status: Home / Self Care | Payer: Medicare Other | Source: Ambulatory Visit | Attending: Internal Medicine | Admitting: Internal Medicine

## 2012-05-30 LAB — CBC
HCT: 27.6 % — ABNORMAL LOW (ref 36.0–46.0)
Hemoglobin: 8.8 g/dL — ABNORMAL LOW (ref 12.0–15.0)
MCV: 101.8 fL — ABNORMAL HIGH (ref 78.0–100.0)
RBC: 2.71 MIL/uL — ABNORMAL LOW (ref 3.87–5.11)
RDW: 16.9 % — ABNORMAL HIGH (ref 11.5–15.5)
WBC: 6.4 10*3/uL (ref 4.0–10.5)

## 2012-05-30 LAB — COMPREHENSIVE METABOLIC PANEL
Albumin: 3 g/dL — ABNORMAL LOW (ref 3.5–5.2)
Alkaline Phosphatase: 66 U/L (ref 39–117)
BUN: 28 mg/dL — ABNORMAL HIGH (ref 6–23)
Calcium: 9.2 mg/dL (ref 8.4–10.5)
Creatinine, Ser: 2.18 mg/dL — ABNORMAL HIGH (ref 0.50–1.10)
GFR calc Af Amer: 24 mL/min — ABNORMAL LOW (ref >90)
Glucose, Bld: 93 mg/dL (ref 70–99)
Total Protein: 6.4 g/dL (ref 6.0–8.3)

## 2012-05-30 LAB — PROTIME-INR: INR: 1.17 (ref 0.00–1.49)

## 2012-05-30 MED ORDER — SODIUM CHLORIDE 0.9 % IJ SOLN
INTRAMUSCULAR | Status: AC
  Filled 2012-05-30: qty 3

## 2012-05-30 MED ORDER — SENNA 8.6 MG PO TABS: 1.0000 | ORAL_TABLET | Freq: Two times a day (BID) | ORAL | Status: AC

## 2012-05-30 MED ORDER — HEPARIN SOD (PORK) LOCK FLUSH 100 UNIT/ML IV SOLN: 500.0000 [IU] | INTRAVENOUS | Status: DC

## 2012-05-30 MED ORDER — CEFUROXIME AXETIL 250 MG PO TABS: 500.0000 mg | ORAL_TABLET | Freq: Two times a day (BID) | ORAL | Status: AC

## 2012-05-30 MED ORDER — HEPARIN SOD (PORK) LOCK FLUSH 100 UNIT/ML IV SOLN
500.0000 [IU] | INTRAVENOUS | Status: DC | PRN
  Administered 2012-05-30: 500 [IU]
  Filled 2012-05-30: qty 5

## 2012-05-30 MED ORDER — OXYCODONE HCL 10 MG PO TB12: 10.0000 mg | ORAL_TABLET | Freq: Two times a day (BID) | ORAL | Status: DC

## 2012-05-30 MED ORDER — SPIRONOLACTONE 50 MG PO TABS: 50.0000 mg | ORAL_TABLET | Freq: Two times a day (BID) | ORAL | Status: DC

## 2012-05-30 MED ORDER — OXYCODONE HCL 5 MG PO TABS: 5.0000 mg | ORAL_TABLET | ORAL | Status: AC | PRN

## 2012-05-30 NOTE — Clinical Social Work Psychosocial (Signed)
    Clinical Social Work Department BRIEF PSYCHOSOCIAL ASSESSMENT 05/30/2012  Patient:  [name], [name]     Account Number:  [id number]     Admit date:  05/29/2012  Clinical Social Worker:  Santa Genera, CLINICAL SOCIAL WORKER  Date/Time:  05/29/2012 04:30 PM  Referred by:  Physician  Date Referred:  05/29/2012 Referred for  Other - See comment   Other Referral:   Physician asked for intervention w family to address unmet needs w caring for patient at home   Interview type:  Patient Other interview type:   Family also involved    PSYCHOSOCIAL DATA Living Status:  FAMILY Admitted from facility:   Level of care:   Primary support name:  Elga Santy Primary support relationship to patient:  CHILD, ADULT Degree of support available:   Significant.  Many family members involved in her care.    CURRENT CONCERNS Current Concerns  Post-Acute Placement   Other Concerns:    SOCIAL WORK ASSESSMENT / PLAN CSW met w patient and son at bedside in ED prior to patient admission to [facility].  Discussed difficulty of caring for patient at home in current weakened condition, patient has had frequent falls and family has difficulty w patient transfers.  Patient has multiple myeloma, osteoporosis and 'severe bone degeneration."    Processed issues w family and talked about possible sources of additional help for patient.  Family is not interested in hospice care at this point as patient is still actively treating her cancer.  Patient is not eligible for Medicaid unless chlidren return transferred land and home assets to patient which could then be taken by Medicaid to pay for cost of her care.  Family and patient are unwilling to do this.  Patient has Medicare HMO which has limited their options in terms of medications and home health assistance severely.  Current situation of generalized weakness and falls is attributed to medication switch necessitated by insurance unwillingness to pay for pain  control medication as prescribed.    Family noted that patient had made significant progress rehabbing w PT at [facility] after discharge from [facility] in late June 2013.  Since going home, patient has declined physically, but family feels this is due to medication side effects.    Family aware of limitations of insurance coverage for SNF. CSW spoke w family and patient again today, and has faxed out patient information to facilities in [facility]. Will keep family advised of any bed offers.  RN CM is also working [employer] company to [employer] issues.   Assessment/plan status:  Psychosocial Support/Ongoing Assessment of Needs Other assessment/ plan:   Information/referral to community resources:   [facility]    PATIENT'S/FAMILY'S RESPONSE TO PLAN OF CARE: Family and patient appreciative.    Clovis Cao Clinical Social Worker [phone])

## 2012-05-30 NOTE — Evaluation (Signed)
Occupational Therapy Evaluation Patient Details Name: [name] MRN: [id number] DOB: 10-18-1933 Today's Date: 05/30/2012 Time: 1478-2956 OT Time Calculation (min): 20 min  OT Assessment / Plan / Recommendation Clinical Impression  Patient is a 78 y/o s/p Generalized Weakness presenting to acute OT. Patient states that she received quite of bit of assistance from daughter with BADL. Pt states that she is performing close to baseline but was able to perform LB dressing tasks at [employer]. Pt and granddaughter state that she is being D/C today for SNF. All OT services can be met at next venue regarding generalized weakness, functional transfers, and ADL performance. Acute OT will sign off.    OT Assessment  All further OT needs can be met in the next venue of care    Follow Up Recommendations  Skilled nursing facility       Equipment Recommendations  Defer to next venue          Precautions / Restrictions Precautions Precautions: Fall   Pertinent Vitals/Pain No reports of pain.    ADL  Upper Body Dressing: Performed;Minimal assistance Where Assessed - Upper Body Dressing: Supported sitting Lower Body Dressing: Performed;+1 Total assistance Where Assessed - Lower Body Dressing: Supported sit to stand Toilet Transfer: Performed;Moderate assistance Toilet Transfer Method: Sit to stand Toilet Transfer Equipment: Other (comment) (to recliner) Equipment Used: Rolling walker Transfers/Ambulation Related to ADLs: Patient transfers at a Mod Assist level with RW.        Visit Information  Last OT Received On: 05/30/12 Assistance Needed: +1    Subjective Data  Subjective: "I just got back into bed." Patient Stated Goal: To go home.   Prior Functioning  Vision/Perception  Home Living Lives With: Daughter Available Help at Discharge: Family;Available 24 hours/day Type of Home: House Home Access: Ramped entrance Home Layout: One level Bathroom Shower/Tub: Teacher, music: Handicapped height Home Adaptive Equipment: Bedside commode/3-in-1;Hospital bed;Wheelchair - manual;Walker - rolling Additional Comments: patient does not use bathroom toilet; she uses bedside commode. Prior Function Level of Independence: Needs assistance Needs Assistance: Bathing;Dressing;Meal Prep;Light Housekeeping;Transfers;Gait Bath: Moderate Dressing: Minimal Meal Prep: Total Light Housekeeping: Total Able to Take Stairs?: No Driving: No Vocation: Retired Musician: No difficulties Dominant Hand: Right      Cognition  Overall Cognitive Status: Appears within functional limits for tasks assessed/performed Arousal/Alertness: Awake/alert Orientation Level: Appears intact for tasks assessed Behavior During Session: [facility] for tasks performed    Extremity/Trunk Assessment Right Upper Extremity Assessment RUE ROM/Strength/Tone: Deficits RUE ROM/Strength/Tone Deficits: A/ROM: Shoulder flexion/shoulder abduction ~30 degrees due to arthritis. All other ranges WFL. MMT: shoulder: 3-/5; all other ranges: 3/5. RUE Coordination: Deficits RUE Coordination Deficits: Impaired fine motor coordination and grip strength. Left Upper Extremity Assessment LUE ROM/Strength/Tone: Deficits LUE ROM/Strength/Tone Deficits: A/ROM: Shoulder flexion/shoulder abduction ~30 degrees due to arthritis. All other ranges WFL. MMT: shoulder: 3-/5; all other ranges: 3/5. LUE Coordination: Deficits LUE Coordination Deficits: Impaired fine motor coordination and grip strength.   Mobility Bed Mobility Bed Mobility: Supine to Sit;Sitting - Scoot to Edge of Bed;Sit to Supine Supine to Sit: 4: Min assist;HOB elevated;With rails Sitting - Scoot to Edge of Bed: 4: Min assist;With rail Sit to Supine: 4: Min assist;With rail;HOB flat Transfers Transfers: Sit to Stand;Stand to Sit Sit to Stand: 3: Mod assist;With upper extremity assist;From bed Stand to Sit: 5: Supervision;With  upper extremity assist;To bed      Balance Balance Balance Assessed: Yes Dynamic Sitting Balance Dynamic Sitting - Balance Support: Feet supported;No  upper extremity supported Dynamic Sitting - Level of Assistance: 4: Min assist Dynamic Sitting - Balance Activities: Reaching for objects Dynamic Sitting - Comments: When raising Bil UE into air, patient leans posteriorly.   End of Session OT - End of Session Activity Tolerance: Patient tolerated treatment well Patient left: in bed;with call bell/phone within reach;with family/visitor present    Limmie Patricia, OTR/L 05/30/2012, 2:07 PM

## 2012-05-30 NOTE — Clinical Social Work Placement (Signed)
    Clinical Social Work Department CLINICAL SOCIAL WORK PLACEMENT NOTE 05/30/2012  Patient:  [name], [name]  Account Number:  [id number] Admit date:  05/29/2012  Clinical Social Worker:  Santa Genera, CLINICAL SOCIAL WORKER  Date/time:  05/30/2012 12:00 N  Clinical Social Work is seeking post-discharge placement for this patient at the following level of care:   SKILLED NURSING   (*CSW will update this form in Epic as items are completed)   05/30/2012  Patient/family provided with [facility] Department of Clinical Social Work's list of facilities offering this level of care within the geographic area requested by the patient (or if unable, by the patient's family).  05/30/2012  Patient/family informed of their freedom to choose among providers that offer the needed level of care, that participate in Medicare, Medicaid or managed care program needed by the patient, have an available bed and are willing to accept the patient.  05/30/2012  Patient/family informed of [facility]' ownership interest in [facility], as well as of the fact that they are under no obligation to receive care at this facility.  PASARR submitted to EDS on  PASARR number received from EDS on   FL2 transmitted to all facilities in geographic area requested by pt/family on  05/30/2012 FL2 transmitted to all facilities within larger geographic area on   Patient informed that his/her managed care company has contracts with or will negotiate with  certain facilities, including the following:   Patient advised that she has [place] and insurance company will make determination about benefits and approval for SNF placement.  RN CM also working [employer] on authorization for current hospital stay.     Patient/family informed of bed offers received:  05/30/2012 Patient chooses bed at [facility] Physician recommends and patient chooses bed at    Patient to be transferred to [facility] on  05/30/2012 Patient to be transferred to facility by [facility] staff via tunnel  The following physician request were entered in Epic:   Additional Comments: Family and facility informed and agreeable to patient transfer.  Clovis Cao Clinical Social Worker [phone])

## 2012-05-30 NOTE — Progress Notes (Signed)
Pt d/c'd via bed to [facility].  Report to [employer]. Currently pt voices no c/o pain or discomfort.

## 2012-05-30 NOTE — Clinical Social Work Note (Signed)
Patient discharged to [facility].  FL2 updated and reviewed w RN.  Family, patient, and facility informed and agreeable.  Discharge summary faxed to facility.  Discharge packet prepared and placed for transport.  Patient transported via tunnel by [facility] staff.  RN asked to call report to [facility] nursing staff.  Clovis Cao Clinical Social Worker [phone])

## 2012-05-30 NOTE — Progress Notes (Signed)
UR chart review completed.  

## 2012-05-30 NOTE — Evaluation (Signed)
Physical Therapy Evaluation Patient Details Name: [name] MRN: [id number] DOB: Jul 21, 1933 Today's Date: 05/30/2012 Time: 0825-0921 PT Time Calculation (min): 56 min  PT Assessment / Plan / Recommendation Clinical Impression  Pt is very pleasant and well motivated but now deconditioned due to recent exacerbation of multiple medical issues.  She additionally has new  L pelvic fx's superimposed on healing pelvic fx's.  She is now unable to functionally ambulate due to LLE weakness.  All movements are very slow and labored because of pain and weakness.  I am recommending SNF at d/c.    PT Assessment  Patient needs continued PT services    Follow Up Recommendations  Skilled nursing facility    Barriers to Discharge None      Equipment Recommendations  Defer to next venue    Recommendations for Other Services     Frequency Min 5X/week    Precautions / Restrictions Precautions Precautions: Fall Precaution Comments: very high risk of fx due to severe osteoporosis Restrictions Weight Bearing Restrictions: No Other Position/Activity Restrictions: WBAT L   Pertinent Vitals/Pain       Mobility  Bed Mobility Bed Mobility: Supine to Sit;Scooting to HOB Supine to Sit: 4: Min guard;HOB elevated;With rails Scooting to HOB: 3: Mod assist Details for Bed Mobility Assistance: unable to shift weight on hips to move pelvis anteriorly Transfers Transfers: Sit to Stand;Stand to Sit Sit to Stand: 4: Min assist;With upper extremity assist;From bed Stand to Sit: 4: Min assist;To chair/3-in-1;With upper extremity assist Ambulation/Gait Ambulation/Gait Assistance: 3: Mod assist Ambulation Distance (Feet): 2 Feet Assistive device: Rolling walker Ambulation/Gait Assistance Details: only able to transfer bed to chair using a walker due to deconditioning and LLE weakness...unable to ambulate functionally Gait Pattern: Step-through pattern;Trunk flexed;Antalgic Gait velocity: very slow and  labored Stairs: No Wheelchair Mobility Wheelchair Mobility: No    Exercises General Exercises - Lower Extremity Ankle Circles/Pumps: AROM;Both;10 reps;Supine Quad Sets: AROM;Both;10 reps;Supine Gluteal Sets: AROM;Both;5 reps;Supine (painful) Short Arc Quad: AROM;Both;10 reps;Supine Heel Slides: AAROM;Both;10 reps;Supine Hip ABduction/ADduction: AAROM;Both;10 reps;Supine   PT Diagnosis: Difficulty walking;Generalized weakness;Acute pain  PT Problem List: Decreased strength;Decreased activity tolerance;Decreased mobility;Obesity;Pain PT Treatment Interventions: Gait training;Functional mobility training;Therapeutic activities;Therapeutic exercise;Patient/family education   PT Goals Acute Rehab PT Goals PT Goal Formulation: With patient/family Time For Goal Achievement: 06/13/12 Potential to Achieve Goals: Good Pt will go Sit to Stand: with supervision;with upper extremity assist PT Goal: Sit to Stand - Progress: Goal set today Pt will go Stand to Sit: with modified independence;with upper extremity assist PT Goal: Stand to Sit - Progress: Goal set today Pt will Transfer Bed to Chair/Chair to Bed: with min assist PT Transfer Goal: Bed to Chair/Chair to Bed - Progress: Goal set today Pt will Ambulate: 1 - 15 feet;with min assist;with rolling walker PT Goal: Ambulate - Progress: Goal set today  Visit Information  Last PT Received On: 05/30/12    Subjective Data      Prior Functioning  Home Living Lives With: Family Available Help at Discharge: Family Type of Home: House Home Access: Ramped entrance Home Layout: One level Bathroom Toilet: Standard Home Adaptive Equipment: Bedside commode/3-in-1;Hospital bed;Walker - rolling;Wheelchair - manual Prior Function Level of Independence: Needs assistance Needs Assistance: Bathing;Dressing;Meal Prep;Light Housekeeping Able to Take Stairs?: No Driving: No Vocation: Retired Musician: No difficulties      Cognition  Overall Cognitive Status: Appears within functional limits for tasks assessed/performed Arousal/Alertness: Awake/alert Orientation Level: Appears intact for tasks assessed Behavior During Session: [facility] for tasks  performed    Extremity/Trunk Assessment Right Lower Extremity Assessment RLE ROM/Strength/Tone: WFL for tasks assessed RLE Sensation: WFL - Light Touch;WFL - Proprioception RLE Coordination: WFL - gross motor Left Lower Extremity Assessment LLE ROM/Strength/Tone: Deficits LLE ROM/Strength/Tone Deficits: strength generally 2/5 at hip, 3-/5 at knee and ankle LLE Sensation: WFL - Light Touch;WFL - Proprioception Trunk Assessment Trunk Assessment: Kyphotic   Balance Balance Balance Assessed: No  End of Session PT - End of Session Equipment Utilized During Treatment: Gait belt Activity Tolerance: Patient tolerated treatment well;Patient limited by pain Patient left: in chair;with call bell/phone within reach;with chair alarm set Nurse Communication: Mobility status  GP     Konrad Penta 05/30/2012, 9:36 AM

## 2012-05-30 NOTE — Discharge Summary (Signed)
Physician Discharge Summary  [name] GNF:[id number] DOB: [birth year] DOA: 05/29/2012  PCP: [name], MD  Admit date: 05/29/2012 Discharge date: 05/30/2012  Recommendations for Outpatient Follow-up:  1. The patient was discharged to the [facility].  Discharge Diagnoses:  1. Generalized weakness, etiology multifactorial including medication side effect from MS Contin, urinary tract infection, and acute renal failure versus progressive chronic kidney disease. 2. Fall with resultant right pubic rami fracture. 3. Healing left sided pubic rami fractures. 4. Urinary tract infection. 5. Acute renal failure versus progressive chronic kidney disease as a result of multiple myeloma. 6. Multiple myeloma. The patient is hospice eligible but refuses hospice at this time. She is being continued on Zometa every 4-6 weeks by Dr. Mariel Sleet. 7. Chronic anemia secondary to multiple myeloma. 8. Chronic low back pain. 9. Morbid obesity. 10. Unspecified secondary cardiomyopathy. 11. Chronic bilateral lower extremity edema.  Discharge Condition: Stable  Diet recommendation: Heart healthy  Wt Readings from Last 3 Encounters:  05/29/12 99.6 kg (219 lb 9.3 oz)  05/08/12 102.059 kg (225 lb)  04/19/12 99.156 kg (218 lb 9.6 oz)    History of present illness:  The patient is a 78 year old woman with a history significant for multiple myeloma, previous pubic rami fractures on the left from a fall in May 2013, and COPD. She was recently discharged from the [facility] 2 weeks ago following rehabilitation after a hospitalization in June 2013 for pneumonia. She presented to the emergency department on 05/29/2012 with a chief complaint of generalized weakness. She attributed the generalized weakness to taking MS Contin because her insurance company would not pay for both OxyContin and oxycodone. After she took several doses of MS Contin, she was barely able to function. She also had a fall on the morning of  admission but had no head trauma. She felt some discomfort in her pelvis. She denies passing out. In the emergency department, she was afebrile and hemodynamically stable. CT scan of her left hip revealed healing fractures. X-rays of her pelvis revealed irregular healing fractures of the left pubic rami and apparent new fractures of the right superior and inferior pubic ramus and possible fracture involving the left acetabulum. Her lab data were significant for creatinine of 2.13, normal troponin I., and hemoglobin of 9.5. Her urinalysis revealed nitrites, too numerous to count WBCs, many squamous cells, and many bacteria. She was admitted for further evaluation and management.  Hospital Course:  A urine culture was ordered. The patient was started on IV Rocephin for treatment of presumed urinary tract infection. IV fluid hydration was started. Her creatinine was noted to be 2.13. During the previous hospitalization in June, her creatinine was 1.26 at the time of discharge. On July 30th, it had increased to 1.82. Aldactone was recently increased to 3 times a day from twice a day by oncology due to an increase in  lower extremity edema. Therefore, it was believed that the patient may have had an element of acute renal failure superimposed on chronic kidney disease. Also, progressive kidney disease was  considered in light of her advanced multiple myeloma. MS Contin was discontinued. She was restarted on OxyContin and as needed oxycodone. She was evaluated by the physical therapist who recommended a return to rehabilitation in a skilled environment for therapy. The patient and her son agreed. The [facility] will accept the patient today.  She remained hemodynamically stable and afebrile. Her creatinine remained stable despite IV fluid hydration overnight. Aldactone was restarted at twice a day  rather than 3 times a day. She was also continued on standing dose Lasix. She received one dose of Rocephin. She was  discharged on 4 more days of Ceftin. The urine culture was pending at the time of discharge. Her hemoglobin fell from 9.5-8.8, owing to the dilutional effects of the IV fluids. She was discharged to the [facility] in stable condition.   Procedures:  None  Consultations:  None  Discharge Exam: Filed Vitals:   05/30/12 1330  BP: 107/65  Pulse: 83  Temp: 98 F (36.7 C)  Resp: 20   Filed Vitals:   05/29/12 1809 05/29/12 2119 05/30/12 0457 05/30/12 1330  BP: 95/68 107/63 91/54 107/65  Pulse: 89 83 68 83  Temp: 97.6 F (36.4 C) 97.9 F (36.6 C) 98.4 F (36.9 C) 98 F (36.7 C)  TempSrc: Oral Oral Oral Oral  Resp: 20 20 20 20   Height: 5\' 2"  (1.575 m)     Weight: 99.6 kg (219 lb 9.3 oz)     SpO2: 96% 88% 94% 95%    General: No acute distress. Cardiovascular: S1, S2, with a 1-2/6 systolic murmur. Respiratory: Decreased breath sounds in the bases, otherwise clear. Abdomen: Obese, positive bowel sounds, soft, nontender, nondistended. Pelvis: Mild tenderness of the bilateral pubic rami. Extremities: Pedal edema.  Discharge Instructions  Discharge Orders    Future Appointments: Provider: Department: Dept Phone: Center:   06/10/2012 10:10 AM [facility] [phone] None   06/10/2012 10:30 AM [facility] [phone] None   06/25/2012 10:15 AM [facility] [phone] None   07/14/2012 9:00 AM Randall An, MD [facility] [phone] None     Future Orders Please Complete By Expires   Diet - low sodium heart healthy      Increase activity slowly        Medication List  As of 05/30/2012  1:39 PM   STOP taking these medications         acetaminophen 325 MG tablet      docusate sodium 100 MG capsule      morphine 30 MG 12 hr tablet         TAKE these medications         albuterol (2.5 MG/3ML) 0.083% nebulizer solution   Commonly known as: PROVENTIL   Take 2.5 mg by nebulization every 6 (six) hours  as needed. For shortness of breath/wheezing      cefUROXime 250 MG tablet   Commonly known as: CEFTIN   Take 2 tablets (500 mg total) by mouth 2 (two) times daily.      cholecalciferol 1000 UNITS tablet   Commonly known as: VITAMIN D   Take 1,000 Units by mouth daily.      diazepam 5 MG tablet   Commonly known as: VALIUM   Take 1 tablet (5 mg total) by mouth every 6 (six) hours as needed for anxiety or sleep. For anxiety      folic acid 1 MG tablet   Commonly known as: FOLVITE   Take 1 mg by mouth daily.      furosemide 20 MG tablet   Commonly known as: LASIX   Take 1 tablet (20 mg total) by mouth daily.      guaiFENesin 600 MG 12 hr tablet   Commonly known as: MUCINEX   Take 2 tablets (1,200 mg total) by mouth 2 (two) times daily.      NU-IRON 150 MG capsule   Generic drug:  iron polysaccharides   Take 150 mg by mouth every Monday, Wednesday, and Friday.      omeprazole 20 MG capsule   Commonly known as: PRILOSEC   Take 1 capsule (20 mg total) by mouth daily.      oxyCODONE 5 MG immediate release tablet   Commonly known as: Oxy IR/ROXICODONE   Take 1-2 tablets (5-10 mg total) by mouth every 4 (four) hours as needed.      oxyCODONE 10 MG 12 hr tablet   Commonly known as: OXYCONTIN   Take 1 tablet (10 mg total) by mouth every 12 (twelve) hours.      senna 8.6 MG Tabs   Commonly known as: SENOKOT   Take 1 tablet (8.6 mg total) by mouth 2 (two) times daily.      spironolactone 50 MG tablet   Commonly known as: ALDACTONE   Take 1 tablet (50 mg total) by mouth 2 (two) times daily.              The results of significant diagnostics from this hospitalization (including imaging, microbiology, ancillary and laboratory) are listed below for reference.    Significant Diagnostic Studies: Dg Lumbar Spine Complete  05/29/2012  *RADIOLOGY REPORT*  Clinical Data: Fall.  Weakness.  LUMBAR SPINE - COMPLETE 4+ VIEW  Comparison: 06/09/2010  Findings: A left acetabular fracture  is present.  Abnormal thickening of the left arcuate lines of the pelvis suggest left sacral fracture. Bilateral pubic ramus fractures noted.  Vertebra plana noted at L3, essentially stable from the prior exam. Prominent superior endplate compression fracture at L2 appears similar.  Superior endplate compression of L4 is likewise similar, and there is chronic wedging at T12.  IMPRESSION:  1.  Stable lumbar spine compression fractures. 2.  Fractures of the left acetabulum, bilateral pubic rami, and left sacrum.  Original Report Authenticated By: Dellia Cloud, M.D.   Dg Pelvis 1-2 Views  05/29/2012  *RADIOLOGY REPORT*  Clinical Data: Fall.  Weakness.  PELVIS - 1-2 VIEW  Comparison: 03/08/2012  Findings: Healing response along the left inferior and superior pubic ramus fractures noted.  There are also abnormal band of sclerosis in the right inferior and superior pubic rami potentially reflecting new or originally occult fractures.  In addition, there is new abnormal sclerosis along the medial portion of the left acetabulum which could likewise represent fracture.  Sclerosis along the left sacral arcuate lines may be a secondary sign of left sacral fracture.  IMPRESSION:  1.  Irregular healing fractures of the left pubic rami. 2.  Newly apparent fractures of the right superior and inferior pubic ramus, and possible fracture involving the left acetabulum. 3.  Thickened appearing left arcuate lines may reflect left sacral fracture.  Original Report Authenticated By: Dellia Cloud, M.D.   Ct Hip Left Wo Contrast  05/29/2012  *RADIOLOGY REPORT*  Clinical Data: Left hip pain status post fall.  Question left acetabular fracture.  CT OF THE LEFT HIP WITHOUT CONTRAST  Technique:  Multidetector CT imaging of the left hip was performed according to the standard protocol. Multiplanar CT image reconstructions were also generated.  Comparison: Pelvic radiographs 05/29/2012.  Left hip radiographs 03/08/2012.   Findings: This study is limited to the left hip and inferior left pelvis.  The entire pelvis is not imaged.  As demonstrated radiographically, there is exuberant callus surrounding healing fractures of the left superior and inferior pubic rami.  The left superior pubic ramus fracture extends into the anterior medial walls  of the left acetabulum.  These fractures are incompletely healed.  Right parasymphyseal fractures are partially imaged and appear nondisplaced.  There is no diastasis of the symphysis pubis.  There is sclerosis and anterior cortical irregularity in the visualized inferior left sacrum consistent with a healing fracture. Most of the sacrum is not imaged on this examination of the left hip.  The inferior aspect of the left sacroiliac joint appears normal.  There is no evidence of proximal left femur fracture or avascular necrosis.  There is no evidence of significant hematoma surrounding the fractures.  Moderate stool is noted within the rectum.  IMPRESSION:  1.  Healing fractures of the left hemi pelvis as described.  The superior pubic rami fracture extends laterally to involve the anterior medial walls of the left acetabulum. 2.  Healing fracture of the left sacrum is partially imaged.  This study does not include the entire pelvis. 3.  No evidence of proximal left femur fracture. 4.  No significant hematoma demonstrated.  Original Report Authenticated By: Gerrianne Scale, M.D.   Dg Chest Port 1 View  05/29/2012  *RADIOLOGY REPORT*  Clinical Data: Weakness.  Fall.  PORTABLE CHEST - 1 VIEW  Comparison: 04/17/2012  Findings: Stable mild cardiomegaly noted with tortuosity of the thoracic aorta.  Right-sided Port-A-Cath noted with tip projecting over the SVC.  No pulmonary edema is observed.  The lungs appear grossly clear. Thoracic spondylosis noted.  IMPRESSION:  1.  Cardiomegaly, without edema. 2.  Tortuous thoracic aorta. 3.  Overall stable appearance of the chest.  Original Report  Authenticated By: Dellia Cloud, M.D.   Dg Knee Complete 4 Views Left  05/29/2012  *RADIOLOGY REPORT*  Clinical Data: Fall.  LEFT KNEE - COMPLETE 4+ VIEW  Comparison: None.  Findings: Exaggerated convex contour of the proximal tibial metaphysis medially is not characteristic for fracture but could represent a sessile osteochondroma.  Mild degenerative loss of medial compartmental articular space is present.  Bony demineralization noted.  Equivocal appearance for knee effusion in the suprapatellar bursa.  No fracture observed.  IMPRESSION:  1.  Equivocal knee effusion. 2.  Suspected sessile osteochondroma of the medial metaphysis of the proximal tibia.  Original Report Authenticated By: Dellia Cloud, M.D.    Microbiology: No results found for this or any previous visit (from the past 240 hour(s)).   Labs: Basic Metabolic Panel:  Lab 05/30/12 6440 05/29/12 0934  NA 141 137  K 3.9 4.0  CL 105 99  CO2 27 29  GLUCOSE 93 105*  BUN 28* 30*  CREATININE 2.18* 2.13*  CALCIUM 9.2 10.5  MG -- --  PHOS -- --   Liver Function Tests:  Lab 05/30/12 0443 05/29/12 0950  AST 18 22  ALT 9 11  ALKPHOS 66 74  BILITOT 0.2* 0.3  PROT 6.4 7.2  ALBUMIN 3.0* 3.5   No results found for this basename: LIPASE:5,AMYLASE:5 in the last 168 hours No results found for this basename: AMMONIA:5 in the last 168 hours CBC:  Lab 05/30/12 0443 05/29/12 0934  WBC 6.4 8.7  NEUTROABS -- 7.4  HGB 8.8* 9.5*  HCT 27.6* 29.4*  MCV 101.8* 101.0*  PLT 175 203   Cardiac Enzymes:  Lab 05/29/12 0950  CKTOTAL --  CKMB --  CKMBINDEX --  TROPONINI <0.30   BNP: BNP (last 3 results)  Basename 03/02/12 1125  PROBNP 897.1*   CBG: No results found for this basename: GLUCAP:5 in the last 168 hours  Time coordinating discharge: Less  than 35 minutes.  Signed:  Rashay Barnette  [facility] 05/30/2012, 1:39 PM

## 2012-05-30 NOTE — Care Management Note (Signed)
    Page 1 of 1   05/30/2012     2:22:20 PM   CARE MANAGEMENT NOTE 05/30/2012  Patient:  RENIYA, MCCLEES   Account Number:  [id number]  Date Initiated:  05/30/2012  Documentation initiated by:  Sharrie Rothman  Subjective/Objective Assessment:   Pt admitted from home with weakness and UTI. Pt has a granddaughter who lives with her and assist with her care. Pt is now wanting placement.     Action/Plan:   CSW has a bed and will arrange discharge to [facility] today.   Anticipated DC Date:  05/30/2012   Anticipated DC Plan:  SKILLED NURSING FACILITY  In-house referral  Clinical Social Worker      DC Planning Services  CM consult      Choice offered to / List presented to:             Status of service:  Completed, signed off Medicare Important Message given?   (If response is "NO", the following Medicare IM given date fields will be blank) Date Medicare IM given:   Date Additional Medicare IM given:    Discharge Disposition:  SKILLED NURSING FACILITY  Per UR Regulation:    If discussed at Long Length of Stay Meetings, dates discussed:    Comments:  05/30/12 1441 Arlyss Queen, RN BSN CM

## 2012-06-01 LAB — URINE CULTURE: Colony Count: >=100000

## 2012-06-04 ENCOUNTER — Encounter: Payer: Self-pay | Admitting: Oncology

## 2012-06-09 ENCOUNTER — Telehealth (HOSPITAL_COMMUNITY): Payer: Self-pay | Admitting: *Deleted

## 2012-06-09 NOTE — Telephone Encounter (Signed)
Can you work on any assistance for [name] for oxycontin? She can not take MS Contin. That is what made patient end up at [facility]. Insurance would help with MS contin but not oxycontin.

## 2012-06-10 ENCOUNTER — Encounter (HOSPITAL_COMMUNITY): Payer: Medicare Other

## 2012-06-10 ENCOUNTER — Telehealth (HOSPITAL_COMMUNITY): Payer: Self-pay | Admitting: *Deleted

## 2012-06-10 ENCOUNTER — Encounter (HOSPITAL_COMMUNITY): Payer: Medicare Other | Attending: Oncology

## 2012-06-10 DIAGNOSIS — Z9889 Other specified postprocedural states: Secondary | ICD-10-CM | POA: Insufficient documentation

## 2012-06-10 DIAGNOSIS — E669 Obesity, unspecified: Secondary | ICD-10-CM

## 2012-06-10 DIAGNOSIS — K219 Gastro-esophageal reflux disease without esophagitis: Secondary | ICD-10-CM

## 2012-06-10 DIAGNOSIS — D649 Anemia, unspecified: Secondary | ICD-10-CM

## 2012-06-10 DIAGNOSIS — C9000 Multiple myeloma not having achieved remission: Secondary | ICD-10-CM

## 2012-06-10 DIAGNOSIS — N289 Disorder of kidney and ureter, unspecified: Secondary | ICD-10-CM

## 2012-06-10 DIAGNOSIS — D630 Anemia in neoplastic disease: Secondary | ICD-10-CM

## 2012-06-10 DIAGNOSIS — N182 Chronic kidney disease, stage 2 (mild): Secondary | ICD-10-CM

## 2012-06-10 DIAGNOSIS — D696 Thrombocytopenia, unspecified: Secondary | ICD-10-CM

## 2012-06-10 DIAGNOSIS — E538 Deficiency of other specified B group vitamins: Secondary | ICD-10-CM | POA: Insufficient documentation

## 2012-06-10 DIAGNOSIS — D510 Vitamin B12 deficiency anemia due to intrinsic factor deficiency: Secondary | ICD-10-CM

## 2012-06-10 LAB — DIFFERENTIAL
Basophils Absolute: 0 10*3/uL (ref 0.0–0.1)
Basophils Relative: 1 % (ref 0–1)
Eosinophils Absolute: 0.1 10*3/uL (ref 0.0–0.7)
Eosinophils Relative: 2 % (ref 0–5)
Lymphs Abs: 0.6 10*3/uL — ABNORMAL LOW (ref 0.7–4.0)
Neutrophils Relative %: 79 % — ABNORMAL HIGH (ref 43–77)

## 2012-06-10 LAB — CBC
MCH: 32.7 pg (ref 26.0–34.0)
MCHC: 33.6 g/dL (ref 30.0–36.0)
MCV: 97.4 fL (ref 78.0–100.0)
Platelets: 213 10*3/uL (ref 150–400)
RBC: 3.09 MIL/uL — ABNORMAL LOW (ref 3.87–5.11)
RDW: 15.2 % (ref 11.5–15.5)

## 2012-06-10 MED ORDER — DARBEPOETIN ALFA-POLYSORBATE 500 MCG/ML IJ SOLN
INTRAMUSCULAR | Status: AC
  Filled 2012-06-10: qty 1

## 2012-06-10 MED ORDER — HEPARIN SOD (PORK) LOCK FLUSH 100 UNIT/ML IV SOLN
INTRAVENOUS | Status: AC
  Filled 2012-06-10: qty 5

## 2012-06-10 MED ORDER — DARBEPOETIN ALFA-POLYSORBATE 500 MCG/ML IJ SOLN
500.0000 ug | Freq: Once | INTRAMUSCULAR | Status: AC
  Administered 2012-06-10: 500 ug via SUBCUTANEOUS

## 2012-06-10 MED ORDER — SODIUM CHLORIDE 0.9 % IJ SOLN
INTRAMUSCULAR | Status: AC
  Filled 2012-06-10: qty 10

## 2012-06-10 NOTE — Telephone Encounter (Signed)
[name] has called asking if anything has been found out about assistance for oxycontin.

## 2012-06-10 NOTE — Progress Notes (Signed)
Tolerated labs from [facility].  Aranesp 500 mcg sub-q lower rt abd.

## 2012-06-11 ENCOUNTER — Telehealth (HOSPITAL_COMMUNITY): Payer: Self-pay | Admitting: Oncology

## 2012-06-11 NOTE — Telephone Encounter (Signed)
[name] notified of below

## 2012-06-11 NOTE — Telephone Encounter (Signed)
[phone] ?'D IF WE COULD REQUEST A PLAN EXCEPTION  FOR OXYCOTIN SINCE PT COULD NOT TOLERATE MS CONTIN. CSR-JAIME TOOK INFO OVER THE PHONE AND SAID SHE WOULD SUBMIT IT FOR REVIEW. SHE STATED THEY WILL CONTACT us IF MORE INFO IS NEEDED AND THE REVIEW WOULD TAKE UP TO 48HRS.  [name] Database administrator Medical Oncology [phone]

## 2012-06-12 ENCOUNTER — Other Ambulatory Visit (HOSPITAL_COMMUNITY): Payer: Self-pay | Admitting: Oncology

## 2012-06-12 DIAGNOSIS — C9000 Multiple myeloma not having achieved remission: Secondary | ICD-10-CM

## 2012-06-12 MED ORDER — OXYCODONE HCL 10 MG PO TB12: 10.0000 mg | ORAL_TABLET | Freq: Two times a day (BID) | ORAL | Status: DC

## 2012-06-13 ENCOUNTER — Telehealth (HOSPITAL_COMMUNITY): Payer: Self-pay | Admitting: Oncology

## 2012-06-13 NOTE — Telephone Encounter (Signed)
Checked Needymeds[employer]com to see if pt is eligible for copy assistance thru [employer]. Pt is not eligible because they only help non insured pt's. Called the chrondic disease fund because the pt has recvd help from them in the past but they Only offer assist for tx/rx related to her dx of multiple myeloma. She is not eligible for assist From  [facility] co-pay because they do not have funding for M.M. I will apply for assistance To the copay relief fund.

## 2012-06-25 ENCOUNTER — Encounter (HOSPITAL_COMMUNITY): Payer: Medicare Other | Attending: Oncology

## 2012-06-25 DIAGNOSIS — C9000 Multiple myeloma not having achieved remission: Secondary | ICD-10-CM | POA: Insufficient documentation

## 2012-06-25 DIAGNOSIS — N182 Chronic kidney disease, stage 2 (mild): Secondary | ICD-10-CM

## 2012-06-25 DIAGNOSIS — E669 Obesity, unspecified: Secondary | ICD-10-CM | POA: Insufficient documentation

## 2012-06-25 DIAGNOSIS — D649 Anemia, unspecified: Secondary | ICD-10-CM | POA: Insufficient documentation

## 2012-06-25 DIAGNOSIS — N289 Disorder of kidney and ureter, unspecified: Secondary | ICD-10-CM | POA: Insufficient documentation

## 2012-06-25 DIAGNOSIS — D510 Vitamin B12 deficiency anemia due to intrinsic factor deficiency: Secondary | ICD-10-CM | POA: Insufficient documentation

## 2012-06-25 DIAGNOSIS — K219 Gastro-esophageal reflux disease without esophagitis: Secondary | ICD-10-CM | POA: Insufficient documentation

## 2012-06-25 DIAGNOSIS — D696 Thrombocytopenia, unspecified: Secondary | ICD-10-CM | POA: Insufficient documentation

## 2012-06-25 DIAGNOSIS — E538 Deficiency of other specified B group vitamins: Secondary | ICD-10-CM

## 2012-06-25 LAB — CBC
HCT: 35.8 % — ABNORMAL LOW (ref 36.0–46.0)
Hemoglobin: 11.7 g/dL — ABNORMAL LOW (ref 12.0–15.0)
MCHC: 32.7 g/dL (ref 30.0–36.0)
MCV: 97.5 fL (ref 78.0–100.0)
RDW: 15.6 % — ABNORMAL HIGH (ref 11.5–15.5)

## 2012-06-25 LAB — DIFFERENTIAL
Basophils Absolute: 0 10*3/uL (ref 0.0–0.1)
Basophils Relative: 0 % (ref 0–1)
Eosinophils Relative: 1 % (ref 0–5)
Monocytes Absolute: 0.6 10*3/uL (ref 0.1–1.0)
Monocytes Relative: 8 % (ref 3–12)
Neutro Abs: 6.3 10*3/uL (ref 1.7–7.7)

## 2012-06-25 LAB — COMPREHENSIVE METABOLIC PANEL
ALT: 8 U/L (ref 0–35)
AST: 18 U/L (ref 0–37)
Albumin: 4.2 g/dL (ref 3.5–5.2)
Alkaline Phosphatase: 105 U/L (ref 39–117)
Calcium: 10 mg/dL (ref 8.4–10.5)
GFR calc Af Amer: 24 mL/min — ABNORMAL LOW (ref >90)
Potassium: 4.5 mEq/L (ref 3.5–5.1)
Sodium: 135 mEq/L (ref 135–145)
Total Protein: 8 g/dL (ref 6.0–8.3)

## 2012-06-25 MED ORDER — HEPARIN SOD (PORK) LOCK FLUSH 100 UNIT/ML IV SOLN
500.0000 [IU] | Freq: Once | INTRAVENOUS | Status: AC
  Administered 2012-06-25: 500 [IU] via INTRAVENOUS
  Filled 2012-06-25: qty 5

## 2012-06-25 MED ORDER — CYANOCOBALAMIN 1000 MCG/ML IJ SOLN
1000.0000 ug | Freq: Once | INTRAMUSCULAR | Status: AC
  Administered 2012-06-25: 1000 ug via SUBCUTANEOUS

## 2012-06-25 MED ORDER — ZOLEDRONIC ACID 4 MG/5ML IV CONC
2.0000 mg | Freq: Once | INTRAVENOUS | Status: AC
  Administered 2012-06-25: 2 mg via INTRAVENOUS
  Filled 2012-06-25: qty 2.5

## 2012-06-25 MED ORDER — SODIUM CHLORIDE 0.9 % IV SOLN
INTRAVENOUS | Status: DC
  Administered 2012-06-25: 11:00:00 via INTRAVENOUS

## 2012-06-25 MED ORDER — HEPARIN SOD (PORK) LOCK FLUSH 100 UNIT/ML IV SOLN
INTRAVENOUS | Status: AC
  Filled 2012-06-25: qty 5

## 2012-06-25 MED ORDER — CYANOCOBALAMIN 1000 MCG/ML IJ SOLN
INTRAMUSCULAR | Status: AC
  Filled 2012-06-25: qty 1

## 2012-06-25 MED ORDER — SODIUM CHLORIDE 0.9 % IJ SOLN
20.0000 mL | INTRAMUSCULAR | Status: DC | PRN
  Filled 2012-06-25: qty 20

## 2012-07-01 ENCOUNTER — Other Ambulatory Visit (HOSPITAL_COMMUNITY): Payer: Medicare Other

## 2012-07-01 ENCOUNTER — Ambulatory Visit (HOSPITAL_COMMUNITY): Payer: Medicare Other

## 2012-07-07 ENCOUNTER — Other Ambulatory Visit (HOSPITAL_COMMUNITY): Payer: Self-pay | Admitting: Oncology

## 2012-07-14 ENCOUNTER — Encounter (HOSPITAL_BASED_OUTPATIENT_CLINIC_OR_DEPARTMENT_OTHER): Payer: Medicare Other | Admitting: Oncology

## 2012-07-14 ENCOUNTER — Encounter (HOSPITAL_COMMUNITY): Payer: Self-pay | Admitting: Oncology

## 2012-07-14 VITALS — BP 117/82 | HR 87 | Temp 98.0°F | Resp 20 | Wt 209.6 lb

## 2012-07-14 DIAGNOSIS — C9000 Multiple myeloma not having achieved remission: Secondary | ICD-10-CM

## 2012-07-14 DIAGNOSIS — R5381 Other malaise: Secondary | ICD-10-CM

## 2012-07-14 DIAGNOSIS — G893 Neoplasm related pain (acute) (chronic): Secondary | ICD-10-CM

## 2012-07-14 DIAGNOSIS — M949 Disorder of cartilage, unspecified: Secondary | ICD-10-CM

## 2012-07-14 LAB — COMPREHENSIVE METABOLIC PANEL
AST: 20 U/L (ref 0–37)
Albumin: 4.2 g/dL (ref 3.5–5.2)
BUN: 35 mg/dL — ABNORMAL HIGH (ref 6–23)
Calcium: 10.9 mg/dL — ABNORMAL HIGH (ref 8.4–10.5)
Creatinine, Ser: 1.69 mg/dL — ABNORMAL HIGH (ref 0.50–1.10)
Total Protein: 7.6 g/dL (ref 6.0–8.3)

## 2012-07-14 LAB — DIFFERENTIAL
Basophils Absolute: 0 10*3/uL (ref 0.0–0.1)
Basophils Relative: 0 % (ref 0–1)
Eosinophils Absolute: 0.1 10*3/uL (ref 0.0–0.7)
Monocytes Absolute: 0.5 10*3/uL (ref 0.1–1.0)
Monocytes Relative: 9 % (ref 3–12)

## 2012-07-14 LAB — CBC
HCT: 38.7 % (ref 36.0–46.0)
Hemoglobin: 12.4 g/dL (ref 12.0–15.0)
MCH: 31.3 pg (ref 26.0–34.0)
MCHC: 32 g/dL (ref 30.0–36.0)
RDW: 14.8 % (ref 11.5–15.5)

## 2012-07-14 NOTE — Progress Notes (Signed)
Problem number 1 recurrent multiple myeloma with fractures of her pubic rami on the left probably a combination of osteoporosis and myeloma. She is now back at home. She has chronic pain in her pelvis and also of course her right shoulder from old trauma there.  She is now being palliated only. She is sitting down 90+ percent of the day. She has chronic pain and OxyContin works best for her. She got confused and disoriented with MS Contin. Her still trying to work on getting ready OxyContin. Problem #2 her hypogammaglobulinemic presently is not an issue. Problem #3 her anemia has also not been an issue recently and she had responded to Aranesp which is now on hold. Problem #4 chronic lower extremity edema which is resolved at this time on the present regimen.  She is feeling decent but not great. She is still weak and fatigues easily. Her color is excellent however including conjunctival color. Her lungs are clear. Heart shows a regular rhythm and rate without murmur or gallop. Bowel sounds are normal she is not constipated she states. She has no leg edema or arm edema presently. Her abdomen does not reveal hepatomegaly. The excavated area on the right skull is still the same.  She looks very stable but is slowly declining because of the inability to get up and about in my opinion. They are not ready for hospice yet. We will continue to see her back in 8 weeks

## 2012-07-14 NOTE — Patient Instructions (Addendum)
[facility]  Discharge Instructions  RECOMMENDATIONS MADE BY THE CONSULTANT AND ANY TEST RESULTS WILL BE SENT TO YOUR REFERRING DOCTOR.   Lab work today. We will call you if there are any abnormal findings. Return to clinic in 8 weeks to see MD. We will continue to work on getting financial assistance for your medication. Report any issues or concerns to clinic as needed prior to next appointment.    I acknowledge that I have been informed and understand all the instructions given to me and received a copy. I do not have any more questions at this time, but understand that I may call the Specialty Clinic at [facility] at [phone] during business hours should I have any further questions or need assistance in obtaining follow-up care.    __________________________________________  _____________  __________ Signature of Patient or Authorized Representative            Date                   Time    __________________________________________ Nurse's Signature

## 2012-07-14 NOTE — Progress Notes (Signed)
[name] presented for labwork. Labs per MD order drawn via Peripheral Line 23 gauge needle inserted in let antecubital.  Good blood return present. Procedure without incident.  Needle removed intact. Patient tolerated procedure well.

## 2012-07-14 NOTE — Addendum Note (Signed)
Addended by: Dennie Maizes on: 07/14/2012 10:27 AM   Modules accepted: Orders

## 2012-07-15 ENCOUNTER — Other Ambulatory Visit (HOSPITAL_COMMUNITY): Payer: Self-pay | Admitting: Oncology

## 2012-07-15 LAB — KAPPA/LAMBDA LIGHT CHAINS
Kappa free light chain: 2.44 mg/dL — ABNORMAL HIGH (ref 0.33–1.94)
Lambda free light chains: 23.9 mg/dL — ABNORMAL HIGH (ref 0.57–2.63)

## 2012-07-17 LAB — MULTIPLE MYELOMA PANEL, SERUM
Albumin ELP: 56.5 % (ref 55.8–66.1)
Alpha-1-Globulin: 5.1 % — ABNORMAL HIGH (ref 2.9–4.9)
Alpha-2-Globulin: 13.6 % — ABNORMAL HIGH (ref 7.1–11.8)
Beta Globulin: 4.7 % (ref 4.7–7.2)
IgA: 261 mg/dL (ref 69–380)
IgM, Serum: 69 mg/dL (ref 52–322)
Total Protein: 7.1 g/dL (ref 6.0–8.3)

## 2012-07-22 ENCOUNTER — Ambulatory Visit (HOSPITAL_COMMUNITY): Payer: Medicare Other

## 2012-07-22 ENCOUNTER — Encounter (HOSPITAL_COMMUNITY): Payer: Medicare Other | Attending: Oncology

## 2012-07-22 ENCOUNTER — Other Ambulatory Visit (HOSPITAL_COMMUNITY): Payer: Self-pay | Admitting: Oncology

## 2012-07-22 ENCOUNTER — Telehealth (HOSPITAL_COMMUNITY): Payer: Self-pay | Admitting: *Deleted

## 2012-07-22 ENCOUNTER — Other Ambulatory Visit (HOSPITAL_COMMUNITY): Payer: Medicare Other

## 2012-07-22 VITALS — BP 105/69 | HR 66 | Temp 98.2°F | Resp 18

## 2012-07-22 DIAGNOSIS — N289 Disorder of kidney and ureter, unspecified: Secondary | ICD-10-CM | POA: Insufficient documentation

## 2012-07-22 DIAGNOSIS — D510 Vitamin B12 deficiency anemia due to intrinsic factor deficiency: Secondary | ICD-10-CM | POA: Insufficient documentation

## 2012-07-22 DIAGNOSIS — E538 Deficiency of other specified B group vitamins: Secondary | ICD-10-CM

## 2012-07-22 DIAGNOSIS — N182 Chronic kidney disease, stage 2 (mild): Secondary | ICD-10-CM | POA: Insufficient documentation

## 2012-07-22 DIAGNOSIS — D649 Anemia, unspecified: Secondary | ICD-10-CM | POA: Insufficient documentation

## 2012-07-22 DIAGNOSIS — K219 Gastro-esophageal reflux disease without esophagitis: Secondary | ICD-10-CM | POA: Insufficient documentation

## 2012-07-22 DIAGNOSIS — C9000 Multiple myeloma not having achieved remission: Secondary | ICD-10-CM

## 2012-07-22 DIAGNOSIS — E669 Obesity, unspecified: Secondary | ICD-10-CM | POA: Insufficient documentation

## 2012-07-22 DIAGNOSIS — D696 Thrombocytopenia, unspecified: Secondary | ICD-10-CM | POA: Insufficient documentation

## 2012-07-22 MED ORDER — HEPARIN SOD (PORK) LOCK FLUSH 100 UNIT/ML IV SOLN
500.0000 [IU] | Freq: Once | INTRAVENOUS | Status: AC
  Administered 2012-07-22: 500 [IU] via INTRAVENOUS
  Filled 2012-07-22: qty 5

## 2012-07-22 MED ORDER — ZOLEDRONIC ACID 4 MG/5ML IV CONC
2.0000 mg | Freq: Once | INTRAVENOUS | Status: AC
  Administered 2012-07-22: 2 mg via INTRAVENOUS
  Filled 2012-07-22: qty 2.5

## 2012-07-22 MED ORDER — HEPARIN SOD (PORK) LOCK FLUSH 100 UNIT/ML IV SOLN
INTRAVENOUS | Status: AC
  Filled 2012-07-22: qty 5

## 2012-07-22 MED ORDER — CYANOCOBALAMIN 1000 MCG/ML IJ SOLN
INTRAMUSCULAR | Status: AC
  Filled 2012-07-22: qty 1

## 2012-07-22 MED ORDER — SODIUM CHLORIDE 0.9 % IJ SOLN
INTRAMUSCULAR | Status: AC
  Filled 2012-07-22: qty 10

## 2012-07-22 MED ORDER — CYANOCOBALAMIN 1000 MCG/ML IJ SOLN
1000.0000 ug | Freq: Once | INTRAMUSCULAR | Status: AC
  Administered 2012-07-22: 1000 ug via SUBCUTANEOUS

## 2012-07-22 MED ORDER — SODIUM CHLORIDE 0.9 % IJ SOLN
10.0000 mL | Freq: Once | INTRAMUSCULAR | Status: AC
  Administered 2012-07-22: 10 mL via INTRAVENOUS
  Filled 2012-07-22: qty 10

## 2012-07-22 NOTE — Progress Notes (Signed)
[name] tolerated infusion well and without incident; verbalizes understanding for follow-up.  No distress noted at time of discharge and patient was discharged home with her granddaughter.

## 2012-07-22 NOTE — Telephone Encounter (Signed)
Called [facility] and pt had lorcet 120 filled on 9/16 per Dr. Regino Schultze.

## 2012-07-22 NOTE — Progress Notes (Signed)
error 

## 2012-07-22 NOTE — Telephone Encounter (Signed)
We have Oxycontin and Oxycodone on her medicine list.  Does she need Oxycodone?  If she wants Hydrocodone, I can do that, but we need to make a decision between the Hydrocodone and the Oxycodone.  Cannot do both.  She should continue with the Oxycontin no matter what her decision regarding the Oxycodone versus the Hydrocodone.

## 2012-07-23 ENCOUNTER — Telehealth (HOSPITAL_COMMUNITY): Payer: Self-pay | Admitting: Oncology

## 2012-07-23 NOTE — Telephone Encounter (Signed)
COPAY [phone] SPK TO BRENDA AND WAS TOLD THAT THE PT WAS APPROVED ON  SEPT 17 FOR COPAY ASSIST. SHE ADVISED ME THAT THE PT MAY NOT BE ABLE TO USE [facility] SHE MAY HAVE TO USE [facility]. I CALLED [facility] AND TOLD HER TO CALL COPAY RELIEF AND ASK TO BE SET UP  FOR DELIVERY

## 2012-07-24 ENCOUNTER — Other Ambulatory Visit (HOSPITAL_COMMUNITY): Payer: Self-pay | Admitting: Oncology

## 2012-07-24 ENCOUNTER — Telehealth (HOSPITAL_COMMUNITY): Payer: Self-pay | Admitting: Oncology

## 2012-07-24 DIAGNOSIS — C9000 Multiple myeloma not having achieved remission: Secondary | ICD-10-CM

## 2012-07-24 MED ORDER — OXYCODONE HCL 10 MG PO TB12: 10.0000 mg | ORAL_TABLET | Freq: Two times a day (BID) | ORAL | Status: DC

## 2012-07-24 NOTE — Telephone Encounter (Signed)
COPAY [phone] PER CSR REP THEY DO NOT HAVE COPAY CARD TO GIVE TO THE PT'S. PT'S MUST PAY UP FRONT AND BE REIMBURSED BY THEM OR HAVE IT SET UP WITH A RX TO GET THE SCRIPT AND  THEY BILL COPAY RELIEF. I CALLED [facility] AND [facility] TO SEE IF THEY WILL  BILL THE COPY RELIEF FUND AND NOT THE PT. THEY ALL STATED NO NOT WITH OUT A BIN# AND  ETC FROM THE CR[facility]. I ALSO CALLED [facility] AND [facility] AND THEY DO NOT DISPENSE NARCOTICS. I WILL CONTACT JERRY TO LET HIM KNOW.

## 2012-07-30 ENCOUNTER — Other Ambulatory Visit (HOSPITAL_COMMUNITY): Payer: Self-pay

## 2012-07-30 ENCOUNTER — Telehealth (HOSPITAL_COMMUNITY): Payer: Self-pay

## 2012-07-30 ENCOUNTER — Telehealth (HOSPITAL_COMMUNITY): Payer: Self-pay | Admitting: Oncology

## 2012-07-30 MED ORDER — DIAZEPAM 5 MG PO TABS: 5.0000 mg | ORAL_TABLET | Freq: Every evening | ORAL | Status: AC | PRN

## 2012-07-30 NOTE — Telephone Encounter (Signed)
Spoke with Alona Bene and she has used Valium 5 mg in the past and said it helped her to rest well.  Talked with daughter [name] also who wants her mom to get whatever she needs.

## 2012-07-30 NOTE — Telephone Encounter (Signed)
Patient notified that Dr. Mariel Sleet ok'd prescription for Valium 5 mg # 30 to take 1 po @ HS PRN sleep.  Called into [place] and spoke with April.

## 2012-07-30 NOTE — Telephone Encounter (Signed)
Message copied by Evelena Leyden on Wed Jul 30, 2012  2:08 PM ------      Message from: Mahalia Longest      Created: Wed Jul 30, 2012 10:57 AM                   ----- Message -----         From: Randall An, MD         Sent: 07/30/2012  10:28 AM           To: Mahalia Longest            Give to rn to call and see if we need pain control to be better or just sleeping aid

## 2012-07-30 NOTE — Telephone Encounter (Signed)
Message copied by Evelena Leyden on Wed Jul 30, 2012  4:31 PM ------      Message from: Mariel Sleet, ERIC S      Created: Wed Jul 30, 2012  2:34 PM       What has she used for sleep in past without side effects and we will call it in. Do we need to ask her daughter?

## 2012-07-30 NOTE — Telephone Encounter (Signed)
Message copied by Evelena Leyden on Wed Jul 30, 2012  4:37 PM ------      Message from: Mariel Sleet, ERIC S      Created: Wed Jul 30, 2012  2:34 PM       What has she used for sleep in past without side effects and we will call it in. Do we need to ask her daughter?

## 2012-07-30 NOTE — Telephone Encounter (Signed)
Spoke with [name].  Is taking hydrocodone 10/325 mg (given to her by PCP) for pain about 2 times daily.  States "At night pain is worse and I stay awake worrying about this cancer and just can't sleep."   Has not taken any valium or the oxycodone or the oxycontin since she left [facility] over 3 weeks ago.  Stated was not given any prescriptions.

## 2012-07-31 ENCOUNTER — Telehealth (HOSPITAL_COMMUNITY): Payer: Self-pay | Admitting: Oncology

## 2012-07-31 ENCOUNTER — Other Ambulatory Visit (HOSPITAL_COMMUNITY): Payer: Self-pay | Admitting: Oncology

## 2012-07-31 ENCOUNTER — Other Ambulatory Visit (HOSPITAL_COMMUNITY): Payer: Medicare Other

## 2012-07-31 DIAGNOSIS — C9000 Multiple myeloma not having achieved remission: Secondary | ICD-10-CM

## 2012-07-31 DIAGNOSIS — N182 Chronic kidney disease, stage 2 (mild): Secondary | ICD-10-CM

## 2012-08-01 ENCOUNTER — Encounter (HOSPITAL_BASED_OUTPATIENT_CLINIC_OR_DEPARTMENT_OTHER): Payer: Medicare Other

## 2012-08-01 DIAGNOSIS — C9000 Multiple myeloma not having achieved remission: Secondary | ICD-10-CM

## 2012-08-01 DIAGNOSIS — N182 Chronic kidney disease, stage 2 (mild): Secondary | ICD-10-CM

## 2012-08-01 LAB — CBC WITH DIFFERENTIAL/PLATELET
Basophils Relative: 1 % (ref 0–1)
Eosinophils Absolute: 0.1 10*3/uL (ref 0.0–0.7)
Hemoglobin: 10.9 g/dL — ABNORMAL LOW (ref 12.0–15.0)
MCH: 31.5 pg (ref 26.0–34.0)
MCHC: 32.7 g/dL (ref 30.0–36.0)
Monocytes Relative: 7 % (ref 3–12)
Neutrophils Relative %: 78 % — ABNORMAL HIGH (ref 43–77)
RDW: 14.9 % (ref 11.5–15.5)

## 2012-08-01 LAB — COMPREHENSIVE METABOLIC PANEL
Alkaline Phosphatase: 88 U/L (ref 39–117)
BUN: 27 mg/dL — ABNORMAL HIGH (ref 6–23)
GFR calc Af Amer: 30 mL/min — ABNORMAL LOW (ref >90)
GFR calc non Af Amer: 26 mL/min — ABNORMAL LOW (ref >90)
Glucose, Bld: 92 mg/dL (ref 70–99)
Potassium: 3.6 mEq/L (ref 3.5–5.1)
Total Bilirubin: 0.4 mg/dL (ref 0.3–1.2)
Total Protein: 6.7 g/dL (ref 6.0–8.3)

## 2012-08-01 NOTE — Progress Notes (Signed)
Labs drawn today for cbc/diff,cmp 

## 2012-08-04 ENCOUNTER — Telehealth (HOSPITAL_COMMUNITY): Payer: Self-pay

## 2012-08-04 NOTE — Telephone Encounter (Signed)
Call from [place].  Wants something for anxiety during day.  States "I tense and worried all day and I don't sleep at all during the night even with the Valium.  If I could get something to relax me during the day I think I would sleep better during the night."

## 2012-08-04 NOTE — Telephone Encounter (Signed)
Message copied by Evelena Leyden on Mon Aug 04, 2012  5:59 PM ------      Message from: Mariel Sleet, ERIC S      Created: Mon Aug 04, 2012  5:04 PM       Has she used xanax successfully in past?      If we go with that then we stop the valium or just go with 2.5 mg Valium bid during day and full dose hs

## 2012-08-04 NOTE — Telephone Encounter (Signed)
Per Alona Bene a prescription for Alprazolam was called into [facility] earlier today and it has already been delivered.  Instructed not to take the Valium with the Alprazolam.

## 2012-08-06 ENCOUNTER — Encounter (HOSPITAL_BASED_OUTPATIENT_CLINIC_OR_DEPARTMENT_OTHER): Payer: Medicare Other

## 2012-08-06 VITALS — BP 141/42 | HR 117 | Temp 97.9°F | Resp 20

## 2012-08-06 DIAGNOSIS — E669 Obesity, unspecified: Secondary | ICD-10-CM

## 2012-08-06 DIAGNOSIS — D649 Anemia, unspecified: Secondary | ICD-10-CM

## 2012-08-06 DIAGNOSIS — D510 Vitamin B12 deficiency anemia due to intrinsic factor deficiency: Secondary | ICD-10-CM

## 2012-08-06 DIAGNOSIS — N182 Chronic kidney disease, stage 2 (mild): Secondary | ICD-10-CM

## 2012-08-06 DIAGNOSIS — N039 Chronic nephritic syndrome with unspecified morphologic changes: Secondary | ICD-10-CM

## 2012-08-06 DIAGNOSIS — D696 Thrombocytopenia, unspecified: Secondary | ICD-10-CM

## 2012-08-06 DIAGNOSIS — K219 Gastro-esophageal reflux disease without esophagitis: Secondary | ICD-10-CM

## 2012-08-06 DIAGNOSIS — C9000 Multiple myeloma not having achieved remission: Secondary | ICD-10-CM

## 2012-08-06 DIAGNOSIS — N289 Disorder of kidney and ureter, unspecified: Secondary | ICD-10-CM

## 2012-08-06 MED ORDER — DARBEPOETIN ALFA-POLYSORBATE 500 MCG/ML IJ SOLN
500.0000 ug | Freq: Once | INTRAMUSCULAR | Status: AC
  Administered 2012-08-06: 500 ug via SUBCUTANEOUS

## 2012-08-06 MED ORDER — DARBEPOETIN ALFA-POLYSORBATE 500 MCG/ML IJ SOLN
INTRAMUSCULAR | Status: AC
  Filled 2012-08-06: qty 1

## 2012-08-06 NOTE — Progress Notes (Signed)
[name] presents today for injection per MD orders. Aranesp 500 mcg administered SQ in right Abdomen. Administration without incident. Patient tolerated well.  

## 2012-08-07 ENCOUNTER — Other Ambulatory Visit (HOSPITAL_COMMUNITY): Payer: Self-pay | Admitting: Oncology

## 2012-08-07 DIAGNOSIS — K219 Gastro-esophageal reflux disease without esophagitis: Secondary | ICD-10-CM

## 2012-08-07 MED ORDER — OMEPRAZOLE 20 MG PO CPDR: 20.0000 mg | DELAYED_RELEASE_CAPSULE | Freq: Every day | ORAL | Status: DC

## 2012-08-18 ENCOUNTER — Other Ambulatory Visit (HOSPITAL_COMMUNITY): Payer: Self-pay | Admitting: Oncology

## 2012-08-18 DIAGNOSIS — K219 Gastro-esophageal reflux disease without esophagitis: Secondary | ICD-10-CM

## 2012-08-18 MED ORDER — OMEPRAZOLE 20 MG PO CPDR: 20.0000 mg | DELAYED_RELEASE_CAPSULE | Freq: Every day | ORAL | Status: AC

## 2012-08-26 ENCOUNTER — Encounter (HOSPITAL_COMMUNITY): Payer: Medicare Other | Attending: Oncology

## 2012-08-26 ENCOUNTER — Other Ambulatory Visit (HOSPITAL_COMMUNITY): Payer: Self-pay | Admitting: Oncology

## 2012-08-26 DIAGNOSIS — E876 Hypokalemia: Secondary | ICD-10-CM | POA: Insufficient documentation

## 2012-08-26 DIAGNOSIS — M810 Age-related osteoporosis without current pathological fracture: Secondary | ICD-10-CM | POA: Insufficient documentation

## 2012-08-26 DIAGNOSIS — M8448XA Pathological fracture, other site, initial encounter for fracture: Secondary | ICD-10-CM | POA: Insufficient documentation

## 2012-08-26 DIAGNOSIS — C9000 Multiple myeloma not having achieved remission: Secondary | ICD-10-CM

## 2012-08-26 DIAGNOSIS — D696 Thrombocytopenia, unspecified: Secondary | ICD-10-CM

## 2012-08-26 DIAGNOSIS — N289 Disorder of kidney and ureter, unspecified: Secondary | ICD-10-CM

## 2012-08-26 DIAGNOSIS — R609 Edema, unspecified: Secondary | ICD-10-CM | POA: Insufficient documentation

## 2012-08-26 DIAGNOSIS — K219 Gastro-esophageal reflux disease without esophagitis: Secondary | ICD-10-CM

## 2012-08-26 DIAGNOSIS — N182 Chronic kidney disease, stage 2 (mild): Secondary | ICD-10-CM

## 2012-08-26 DIAGNOSIS — E538 Deficiency of other specified B group vitamins: Secondary | ICD-10-CM

## 2012-08-26 DIAGNOSIS — D510 Vitamin B12 deficiency anemia due to intrinsic factor deficiency: Secondary | ICD-10-CM

## 2012-08-26 DIAGNOSIS — E669 Obesity, unspecified: Secondary | ICD-10-CM

## 2012-08-26 DIAGNOSIS — D649 Anemia, unspecified: Secondary | ICD-10-CM | POA: Insufficient documentation

## 2012-08-26 DIAGNOSIS — M949 Disorder of cartilage, unspecified: Secondary | ICD-10-CM

## 2012-08-26 DIAGNOSIS — D801 Nonfamilial hypogammaglobulinemia: Secondary | ICD-10-CM | POA: Insufficient documentation

## 2012-08-26 LAB — BASIC METABOLIC PANEL
CO2: 28 mEq/L (ref 19–32)
Calcium: 10 mg/dL (ref 8.4–10.5)
GFR calc Af Amer: 38 mL/min — ABNORMAL LOW (ref >90)
GFR calc non Af Amer: 33 mL/min — ABNORMAL LOW (ref >90)
Sodium: 140 mEq/L (ref 135–145)

## 2012-08-26 LAB — CBC
HCT: 33.8 % — ABNORMAL LOW (ref 36.0–46.0)
Hemoglobin: 10.8 g/dL — ABNORMAL LOW (ref 12.0–15.0)
RBC: 3.45 MIL/uL — ABNORMAL LOW (ref 3.87–5.11)

## 2012-08-26 LAB — DIFFERENTIAL
Lymphs Abs: 0.7 10*3/uL (ref 0.7–4.0)
Monocytes Absolute: 0.4 10*3/uL (ref 0.1–1.0)
Monocytes Relative: 8 % (ref 3–12)
Neutro Abs: 4.1 10*3/uL (ref 1.7–7.7)
Neutrophils Relative %: 77 % (ref 43–77)

## 2012-08-26 MED ORDER — POTASSIUM CHLORIDE CRYS ER 20 MEQ PO TBCR: EXTENDED_RELEASE_TABLET | ORAL | Status: DC

## 2012-08-26 MED ORDER — CYANOCOBALAMIN 1000 MCG/ML IJ SOLN
INTRAMUSCULAR | Status: AC
  Filled 2012-08-26: qty 1

## 2012-08-26 MED ORDER — SODIUM CHLORIDE 0.9 % IJ SOLN
10.0000 mL | INTRAMUSCULAR | Status: DC | PRN
  Administered 2012-08-26: 10 mL via INTRAVENOUS
  Filled 2012-08-26: qty 10

## 2012-08-26 MED ORDER — ZOLEDRONIC ACID 4 MG/5ML IV CONC
2.0000 mg | Freq: Once | INTRAVENOUS | Status: AC
  Administered 2012-08-26: 2 mg via INTRAVENOUS
  Filled 2012-08-26: qty 2.5

## 2012-08-26 MED ORDER — HEPARIN SOD (PORK) LOCK FLUSH 100 UNIT/ML IV SOLN
INTRAVENOUS | Status: AC
  Filled 2012-08-26: qty 5

## 2012-08-26 MED ORDER — SODIUM CHLORIDE 0.9 % IV SOLN
INTRAVENOUS | Status: DC
  Administered 2012-08-26: 11:00:00 via INTRAVENOUS

## 2012-08-26 MED ORDER — CYANOCOBALAMIN 1000 MCG/ML IJ SOLN
1000.0000 ug | Freq: Once | INTRAMUSCULAR | Status: AC
  Administered 2012-08-26: 1000 ug via SUBCUTANEOUS

## 2012-08-26 MED ORDER — FUROSEMIDE 20 MG PO TABS: 20.0000 mg | ORAL_TABLET | Freq: Every day | ORAL | Status: DC

## 2012-08-26 MED ORDER — HEPARIN SOD (PORK) LOCK FLUSH 100 UNIT/ML IV SOLN
500.0000 [IU] | Freq: Once | INTRAVENOUS | Status: AC
  Administered 2012-08-26: 500 [IU] via INTRAVENOUS
  Filled 2012-08-26: qty 5

## 2012-08-26 MED ORDER — DARBEPOETIN ALFA-POLYSORBATE 500 MCG/ML IJ SOLN
INTRAMUSCULAR | Status: AC
  Filled 2012-08-26: qty 1

## 2012-08-26 MED ORDER — DARBEPOETIN ALFA-POLYSORBATE 500 MCG/ML IJ SOLN
500.0000 ug | Freq: Once | INTRAMUSCULAR | Status: AC
  Administered 2012-08-26: 500 ug via SUBCUTANEOUS

## 2012-08-26 NOTE — Progress Notes (Signed)
Tolerated well

## 2012-08-27 ENCOUNTER — Ambulatory Visit (HOSPITAL_COMMUNITY): Payer: Medicare Other

## 2012-08-27 LAB — KAPPA/LAMBDA LIGHT CHAINS
Kappa, lambda light chain ratio: 0.08 — ABNORMAL LOW (ref 0.26–1.65)
Lambda free light chains: 26.4 mg/dL — ABNORMAL HIGH (ref 0.57–2.63)

## 2012-08-29 LAB — MULTIPLE MYELOMA PANEL, SERUM
Albumin ELP: 56.6 % (ref 55.8–66.1)
Beta 2: 4.9 % (ref 3.2–6.5)
Beta Globulin: 5.8 % (ref 4.7–7.2)
IgA: 237 mg/dL (ref 69–380)
IgM, Serum: 68 mg/dL (ref 52–322)
M-Spike, %: 0.33 g/dL
Total Protein: 6.2 g/dL (ref 6.0–8.3)

## 2012-09-01 ENCOUNTER — Other Ambulatory Visit (HOSPITAL_COMMUNITY): Payer: Self-pay | Admitting: Oncology

## 2012-09-01 DIAGNOSIS — C9000 Multiple myeloma not having achieved remission: Secondary | ICD-10-CM

## 2012-09-01 MED ORDER — OXYCODONE HCL 10 MG PO TB12: 10.0000 mg | ORAL_TABLET | Freq: Two times a day (BID) | ORAL | Status: DC

## 2012-09-02 ENCOUNTER — Encounter: Payer: Self-pay | Admitting: Oncology

## 2012-09-04 ENCOUNTER — Telehealth (HOSPITAL_COMMUNITY): Payer: Self-pay | Admitting: *Deleted

## 2012-09-04 NOTE — Telephone Encounter (Signed)
Call received from [facility] concerning Oxycontin RX. States it must be mailed to [facility] [address] [place] [address]

## 2012-09-05 ENCOUNTER — Other Ambulatory Visit (HOSPITAL_COMMUNITY): Payer: Medicare Other

## 2012-09-09 ENCOUNTER — Telehealth (HOSPITAL_COMMUNITY): Payer: Self-pay | Admitting: Oncology

## 2012-09-09 NOTE — Telephone Encounter (Signed)
RTN'D CALL TO ANDREA @ [facility] RE Mardella'S OXYCONTIN REFILL. I ?'D IF WE COULD SEND THE REFILL IN PRIOR TO THE REFILL DATE IN ORDER TO  INSURE TIMELY DELIVERY. PER ANDREA THAT WILL BE OK.

## 2012-09-12 ENCOUNTER — Encounter (HOSPITAL_COMMUNITY): Payer: Self-pay | Admitting: Oncology

## 2012-09-12 ENCOUNTER — Encounter (HOSPITAL_BASED_OUTPATIENT_CLINIC_OR_DEPARTMENT_OTHER): Payer: Medicare Other | Admitting: Oncology

## 2012-09-12 DIAGNOSIS — C9000 Multiple myeloma not having achieved remission: Secondary | ICD-10-CM

## 2012-09-12 LAB — BASIC METABOLIC PANEL
BUN: 42 mg/dL — ABNORMAL HIGH (ref 6–23)
CO2: 22 mEq/L (ref 19–32)
Calcium: 10 mg/dL (ref 8.4–10.5)
Chloride: 104 mEq/L (ref 96–112)
Creatinine, Ser: 2.07 mg/dL — ABNORMAL HIGH (ref 0.50–1.10)
GFR calc Af Amer: 25 mL/min — ABNORMAL LOW (ref >90)
GFR calc non Af Amer: 22 mL/min — ABNORMAL LOW (ref >90)
Glucose, Bld: 97 mg/dL (ref 70–99)
Potassium: 5.3 mEq/L — ABNORMAL HIGH (ref 3.5–5.1)
Sodium: 138 mEq/L (ref 135–145)

## 2012-09-12 LAB — CBC WITH DIFFERENTIAL/PLATELET
Basophils Absolute: 0 10*3/uL (ref 0.0–0.1)
Eosinophils Absolute: 0.1 10*3/uL (ref 0.0–0.7)
Eosinophils Relative: 2 % (ref 0–5)
HCT: 37.7 % (ref 36.0–46.0)
Lymphocytes Relative: 11 % — ABNORMAL LOW (ref 12–46)
MCH: 31.5 pg (ref 26.0–34.0)
MCV: 99 fL (ref 78.0–100.0)
Monocytes Absolute: 0.5 10*3/uL (ref 0.1–1.0)
Platelets: 209 10*3/uL (ref 150–400)
RDW: 16.3 % — ABNORMAL HIGH (ref 11.5–15.5)
WBC: 4.4 10*3/uL (ref 4.0–10.5)

## 2012-09-12 MED ORDER — OXYCODONE HCL 10 MG PO TB12: 10.0000 mg | ORAL_TABLET | Freq: Three times a day (TID) | ORAL | Status: DC

## 2012-09-12 MED ORDER — ALPRAZOLAM 0.5 MG PO TABS: 0.5000 mg | ORAL_TABLET | Freq: Every evening | ORAL | Status: DC | PRN

## 2012-09-12 MED ORDER — OXYCODONE HCL 5 MG PO TABS: 5.0000 mg | ORAL_TABLET | ORAL | Status: DC | PRN

## 2012-09-12 NOTE — Patient Instructions (Addendum)
[facility]  Discharge Instructions  RECOMMENDATIONS MADE BY THE CONSULTANT AND ANY TEST RESULTS WILL BE SENT TO YOUR REFERRING DOCTOR.   Lab work today. We will call you if there are any abnormal results. Oxycontin 10 mg tablet increased to every 8 hours. [facility] RX given to pt. RX given to pt for Oxy IR and Xanax. Report any issues/concerns to clinic as needed. Return to clinic 4-6 weeks to see Dr.Neijstrom.   I acknowledge that I have been informed and understand all the instructions given to me and received a copy. I do not have any more questions at this time, but understand that I may call the Specialty Clinic at [facility] at [phone] during business hours should I have any further questions or need assistance in obtaining follow-up care.    __________________________________________  _____________  __________ Signature of Patient or Authorized Representative            Date                   Time    __________________________________________ Nurse's Signature

## 2012-09-12 NOTE — Progress Notes (Signed)
Problem #1 recurrent multiple myeloma now receiving palliative therapy. She does have fractures of her pubic rami from a combination of osteoporosis and probably myeloma as well. She has tremendous discomfort in her back which is a combination of degenerative disc disease, degenerative joint disease, and possibly myeloma though her myeloma studies are they were actually very very stable without progression.  She is on OxyContin 10 mg every 12 hours which I am going to change to every 8 hours. We tried MS Contin but she got very confused and disoriented without particular drug.  I am also going to give her today OxyIR 5 mg every 4 hours as needed for pain.  She was also very hypokalemic a couple weeks ago we put her on potassium and we will check her potassium today. We will also check her hemoglobin to see if she needs to be transfused. The goals are strictly palliative at this point in time. Problem #2 severe hypogammaglobulinemia secondary to myeloma not an issue presently Problem #3 anemia secondary to the above with poor response to Aranesp which we stopped. Problem #4 chronic lower extremity edema much improved at this time and we will leave her on the same regimen. I will see her back in 4-6 weeks. She is accompanied by her granddaughter today with whom she lives and who is her primary caregiver presently.

## 2012-09-16 ENCOUNTER — Encounter (HOSPITAL_COMMUNITY): Payer: Medicare Other

## 2012-09-16 NOTE — Progress Notes (Signed)
Aranesp held.  Last week's labs show HGB of 12.

## 2012-09-22 ENCOUNTER — Other Ambulatory Visit (HOSPITAL_COMMUNITY): Payer: Self-pay | Admitting: Oncology

## 2012-09-22 DIAGNOSIS — R609 Edema, unspecified: Secondary | ICD-10-CM

## 2012-09-22 DIAGNOSIS — C9000 Multiple myeloma not having achieved remission: Secondary | ICD-10-CM

## 2012-09-22 MED ORDER — FOLIC ACID 1 MG PO TABS: 1.0000 mg | ORAL_TABLET | Freq: Every day | ORAL | Status: DC

## 2012-09-22 MED ORDER — FUROSEMIDE 20 MG PO TABS: 20.0000 mg | ORAL_TABLET | Freq: Every day | ORAL | Status: AC

## 2012-09-23 ENCOUNTER — Encounter (HOSPITAL_COMMUNITY): Payer: Medicare Other | Attending: Oncology

## 2012-09-23 ENCOUNTER — Encounter (HOSPITAL_COMMUNITY): Payer: Medicare Other

## 2012-09-23 VITALS — BP 102/66 | HR 74 | Temp 97.9°F | Resp 18

## 2012-09-23 DIAGNOSIS — E669 Obesity, unspecified: Secondary | ICD-10-CM | POA: Insufficient documentation

## 2012-09-23 DIAGNOSIS — D696 Thrombocytopenia, unspecified: Secondary | ICD-10-CM | POA: Insufficient documentation

## 2012-09-23 DIAGNOSIS — N289 Disorder of kidney and ureter, unspecified: Secondary | ICD-10-CM | POA: Insufficient documentation

## 2012-09-23 DIAGNOSIS — N182 Chronic kidney disease, stage 2 (mild): Secondary | ICD-10-CM | POA: Insufficient documentation

## 2012-09-23 DIAGNOSIS — E538 Deficiency of other specified B group vitamins: Secondary | ICD-10-CM

## 2012-09-23 DIAGNOSIS — D510 Vitamin B12 deficiency anemia due to intrinsic factor deficiency: Secondary | ICD-10-CM | POA: Insufficient documentation

## 2012-09-23 DIAGNOSIS — K219 Gastro-esophageal reflux disease without esophagitis: Secondary | ICD-10-CM | POA: Insufficient documentation

## 2012-09-23 DIAGNOSIS — C9000 Multiple myeloma not having achieved remission: Secondary | ICD-10-CM | POA: Insufficient documentation

## 2012-09-23 DIAGNOSIS — D649 Anemia, unspecified: Secondary | ICD-10-CM | POA: Insufficient documentation

## 2012-09-23 MED ORDER — ZOLEDRONIC ACID 4 MG/5ML IV CONC
2.0000 mg | Freq: Once | INTRAVENOUS | Status: AC
  Administered 2012-09-23: 2 mg via INTRAVENOUS
  Filled 2012-09-23: qty 2.5

## 2012-09-23 MED ORDER — SODIUM CHLORIDE 0.9 % IV SOLN
INTRAVENOUS | Status: DC
  Administered 2012-09-23: 10:00:00 via INTRAVENOUS

## 2012-09-23 MED ORDER — HEPARIN SOD (PORK) LOCK FLUSH 100 UNIT/ML IV SOLN
INTRAVENOUS | Status: AC
  Filled 2012-09-23: qty 5

## 2012-09-23 MED ORDER — HEPARIN SOD (PORK) LOCK FLUSH 100 UNIT/ML IV SOLN
500.0000 [IU] | Freq: Once | INTRAVENOUS | Status: AC
  Administered 2012-09-23: 500 [IU] via INTRAVENOUS
  Filled 2012-09-23: qty 5

## 2012-09-23 MED ORDER — CYANOCOBALAMIN 1000 MCG/ML IJ SOLN
INTRAMUSCULAR | Status: AC
  Filled 2012-09-23: qty 1

## 2012-09-23 MED ORDER — SODIUM CHLORIDE 0.9 % IJ SOLN
10.0000 mL | INTRAMUSCULAR | Status: DC | PRN
  Filled 2012-09-23: qty 10

## 2012-09-23 MED ORDER — CYANOCOBALAMIN 1000 MCG/ML IJ SOLN
1000.0000 ug | Freq: Once | INTRAMUSCULAR | Status: AC
  Administered 2012-09-23: 1000 ug via SUBCUTANEOUS

## 2012-09-23 NOTE — Progress Notes (Signed)
[name] presents today for injection per MD orders. B12 administered SQ in left Upper Arm. Administration without incident. Patient tolerated well.

## 2012-09-30 ENCOUNTER — Encounter (HOSPITAL_BASED_OUTPATIENT_CLINIC_OR_DEPARTMENT_OTHER): Payer: Medicare Other

## 2012-09-30 ENCOUNTER — Other Ambulatory Visit (HOSPITAL_COMMUNITY): Payer: Self-pay | Admitting: Oncology

## 2012-09-30 VITALS — BP 109/64 | HR 71 | Resp 18

## 2012-09-30 DIAGNOSIS — K219 Gastro-esophageal reflux disease without esophagitis: Secondary | ICD-10-CM

## 2012-09-30 DIAGNOSIS — D649 Anemia, unspecified: Secondary | ICD-10-CM

## 2012-09-30 DIAGNOSIS — C9000 Multiple myeloma not having achieved remission: Secondary | ICD-10-CM

## 2012-09-30 DIAGNOSIS — N289 Disorder of kidney and ureter, unspecified: Secondary | ICD-10-CM

## 2012-09-30 DIAGNOSIS — D510 Vitamin B12 deficiency anemia due to intrinsic factor deficiency: Secondary | ICD-10-CM

## 2012-09-30 DIAGNOSIS — N182 Chronic kidney disease, stage 2 (mild): Secondary | ICD-10-CM

## 2012-09-30 DIAGNOSIS — E669 Obesity, unspecified: Secondary | ICD-10-CM

## 2012-09-30 DIAGNOSIS — D696 Thrombocytopenia, unspecified: Secondary | ICD-10-CM

## 2012-09-30 DIAGNOSIS — D638 Anemia in other chronic diseases classified elsewhere: Secondary | ICD-10-CM

## 2012-09-30 LAB — COMPREHENSIVE METABOLIC PANEL
BUN: 36 mg/dL — ABNORMAL HIGH (ref 6–23)
Calcium: 10 mg/dL (ref 8.4–10.5)
GFR calc Af Amer: 29 mL/min — ABNORMAL LOW (ref >90)
Glucose, Bld: 99 mg/dL (ref 70–99)
Total Protein: 7.1 g/dL (ref 6.0–8.3)

## 2012-09-30 LAB — CBC
HCT: 34.3 % — ABNORMAL LOW (ref 36.0–46.0)
Hemoglobin: 11 g/dL — ABNORMAL LOW (ref 12.0–15.0)
MCH: 31.2 pg (ref 26.0–34.0)
MCHC: 32.1 g/dL (ref 30.0–36.0)
MCV: 97.2 fL (ref 78.0–100.0)

## 2012-09-30 LAB — DIFFERENTIAL
Basophils Absolute: 0 10*3/uL (ref 0.0–0.1)
Eosinophils Absolute: 0.1 10*3/uL (ref 0.0–0.7)
Lymphocytes Relative: 11 % — ABNORMAL LOW (ref 12–46)
Lymphs Abs: 0.7 10*3/uL (ref 0.7–4.0)
Neutrophils Relative %: 81 % — ABNORMAL HIGH (ref 43–77)

## 2012-09-30 MED ORDER — DARBEPOETIN ALFA-POLYSORBATE 500 MCG/ML IJ SOLN
500.0000 ug | Freq: Once | INTRAMUSCULAR | Status: AC
  Administered 2012-09-30: 500 ug via SUBCUTANEOUS

## 2012-09-30 MED ORDER — DARBEPOETIN ALFA-POLYSORBATE 500 MCG/ML IJ SOLN
INTRAMUSCULAR | Status: AC
  Filled 2012-09-30: qty 1

## 2012-09-30 NOTE — Progress Notes (Signed)
[name] presents today for injection per MD orders. Aranesp 500 mcg administered SQ in right Abdomen. Administration without incident. Patient tolerated well.

## 2012-10-01 ENCOUNTER — Other Ambulatory Visit (HOSPITAL_COMMUNITY): Payer: Self-pay

## 2012-10-02 ENCOUNTER — Other Ambulatory Visit (HOSPITAL_COMMUNITY): Payer: Self-pay | Admitting: Oncology

## 2012-10-02 DIAGNOSIS — C9000 Multiple myeloma not having achieved remission: Secondary | ICD-10-CM

## 2012-10-02 MED ORDER — OXYCODONE HCL 5 MG PO TABS: 5.0000 mg | ORAL_TABLET | ORAL | Status: DC | PRN

## 2012-10-08 ENCOUNTER — Other Ambulatory Visit (HOSPITAL_COMMUNITY): Payer: Self-pay | Admitting: Oncology

## 2012-10-08 DIAGNOSIS — J209 Acute bronchitis, unspecified: Secondary | ICD-10-CM

## 2012-10-08 DIAGNOSIS — C9000 Multiple myeloma not having achieved remission: Secondary | ICD-10-CM

## 2012-10-08 MED ORDER — HYDROCOD POLST-CHLORPHEN POLST 10-8 MG/5ML PO LQCR: 5.0000 mL | Freq: Two times a day (BID) | ORAL | Status: DC

## 2012-10-08 MED ORDER — AMOXICILLIN-POT CLAVULANATE 875-125 MG PO TABS: 1.0000 | ORAL_TABLET | Freq: Two times a day (BID) | ORAL | Status: DC

## 2012-10-20 ENCOUNTER — Other Ambulatory Visit (HOSPITAL_COMMUNITY): Payer: Self-pay | Admitting: Oncology

## 2012-10-20 DIAGNOSIS — C9000 Multiple myeloma not having achieved remission: Secondary | ICD-10-CM

## 2012-10-20 MED ORDER — OXYCODONE HCL 10 MG PO TB12: 10.0000 mg | ORAL_TABLET | Freq: Three times a day (TID) | ORAL | Status: DC

## 2012-10-21 ENCOUNTER — Encounter (HOSPITAL_BASED_OUTPATIENT_CLINIC_OR_DEPARTMENT_OTHER): Payer: Medicare Other

## 2012-10-21 VITALS — BP 128/63 | HR 70

## 2012-10-21 DIAGNOSIS — N182 Chronic kidney disease, stage 2 (mild): Secondary | ICD-10-CM

## 2012-10-21 DIAGNOSIS — N289 Disorder of kidney and ureter, unspecified: Secondary | ICD-10-CM

## 2012-10-21 DIAGNOSIS — D649 Anemia, unspecified: Secondary | ICD-10-CM

## 2012-10-21 DIAGNOSIS — D630 Anemia in neoplastic disease: Secondary | ICD-10-CM

## 2012-10-21 DIAGNOSIS — D510 Vitamin B12 deficiency anemia due to intrinsic factor deficiency: Secondary | ICD-10-CM

## 2012-10-21 DIAGNOSIS — D696 Thrombocytopenia, unspecified: Secondary | ICD-10-CM

## 2012-10-21 DIAGNOSIS — E669 Obesity, unspecified: Secondary | ICD-10-CM

## 2012-10-21 DIAGNOSIS — K219 Gastro-esophageal reflux disease without esophagitis: Secondary | ICD-10-CM

## 2012-10-21 DIAGNOSIS — C9000 Multiple myeloma not having achieved remission: Secondary | ICD-10-CM

## 2012-10-21 LAB — CBC
Platelets: 183 10*3/uL (ref 150–400)
RBC: 3.51 MIL/uL — ABNORMAL LOW (ref 3.87–5.11)
WBC: 6.5 10*3/uL (ref 4.0–10.5)

## 2012-10-21 MED ORDER — DARBEPOETIN ALFA-POLYSORBATE 500 MCG/ML IJ SOLN
INTRAMUSCULAR | Status: AC
  Filled 2012-10-21: qty 1

## 2012-10-21 MED ORDER — DARBEPOETIN ALFA-POLYSORBATE 500 MCG/ML IJ SOLN
500.0000 ug | Freq: Once | INTRAMUSCULAR | Status: AC
  Administered 2012-10-21: 500 ug via SUBCUTANEOUS

## 2012-10-21 NOTE — Progress Notes (Signed)
[name] presents today for injection per MD orders. Aranesp 500 mcg administered SQ in right Abdomen. Administration without incident. Patient tolerated well.  Also obtained specimen for cbc from left ac.  Tolerated well.

## 2012-10-24 ENCOUNTER — Encounter (HOSPITAL_COMMUNITY): Payer: Medicare Other | Attending: Oncology

## 2012-10-24 ENCOUNTER — Encounter (HOSPITAL_BASED_OUTPATIENT_CLINIC_OR_DEPARTMENT_OTHER): Payer: Medicare Other | Admitting: Oncology

## 2012-10-24 DIAGNOSIS — D696 Thrombocytopenia, unspecified: Secondary | ICD-10-CM

## 2012-10-24 DIAGNOSIS — D649 Anemia, unspecified: Secondary | ICD-10-CM

## 2012-10-24 DIAGNOSIS — N289 Disorder of kidney and ureter, unspecified: Secondary | ICD-10-CM

## 2012-10-24 DIAGNOSIS — E669 Obesity, unspecified: Secondary | ICD-10-CM

## 2012-10-24 DIAGNOSIS — M810 Age-related osteoporosis without current pathological fracture: Secondary | ICD-10-CM

## 2012-10-24 DIAGNOSIS — C9000 Multiple myeloma not having achieved remission: Secondary | ICD-10-CM

## 2012-10-24 DIAGNOSIS — K219 Gastro-esophageal reflux disease without esophagitis: Secondary | ICD-10-CM

## 2012-10-24 DIAGNOSIS — E538 Deficiency of other specified B group vitamins: Secondary | ICD-10-CM

## 2012-10-24 DIAGNOSIS — N182 Chronic kidney disease, stage 2 (mild): Secondary | ICD-10-CM

## 2012-10-24 DIAGNOSIS — D510 Vitamin B12 deficiency anemia due to intrinsic factor deficiency: Secondary | ICD-10-CM

## 2012-10-24 LAB — BASIC METABOLIC PANEL
CO2: 22 mEq/L (ref 19–32)
Calcium: 9.9 mg/dL (ref 8.4–10.5)
Chloride: 102 mEq/L (ref 96–112)
Sodium: 136 mEq/L (ref 135–145)

## 2012-10-24 MED ORDER — CYANOCOBALAMIN 1000 MCG/ML IJ SOLN
INTRAMUSCULAR | Status: AC
  Filled 2012-10-24: qty 1

## 2012-10-24 MED ORDER — CYANOCOBALAMIN 1000 MCG/ML IJ SOLN
1000.0000 ug | Freq: Once | INTRAMUSCULAR | Status: AC
  Administered 2012-10-24: 1000 ug via SUBCUTANEOUS

## 2012-10-24 MED ORDER — SODIUM CHLORIDE 0.9 % IJ SOLN
10.0000 mL | INTRAMUSCULAR | Status: DC | PRN
  Filled 2012-10-24: qty 10

## 2012-10-24 MED ORDER — HEPARIN SOD (PORK) LOCK FLUSH 100 UNIT/ML IV SOLN
500.0000 [IU] | Freq: Once | INTRAVENOUS | Status: AC
  Administered 2012-10-24: 500 [IU] via INTRAVENOUS
  Filled 2012-10-24: qty 5

## 2012-10-24 MED ORDER — HEPARIN SOD (PORK) LOCK FLUSH 100 UNIT/ML IV SOLN
INTRAVENOUS | Status: AC
  Filled 2012-10-24: qty 5

## 2012-10-24 MED ORDER — OXYCODONE HCL 10 MG PO TB12: 10.0000 mg | ORAL_TABLET | Freq: Three times a day (TID) | ORAL | Status: DC

## 2012-10-24 MED ORDER — ZOLEDRONIC ACID 4 MG/5ML IV CONC
2.0000 mg | Freq: Once | INTRAVENOUS | Status: AC
  Administered 2012-10-24: 2 mg via INTRAVENOUS
  Filled 2012-10-24: qty 2.5

## 2012-10-24 MED ORDER — SODIUM CHLORIDE 0.9 % IV SOLN
INTRAVENOUS | Status: DC
  Administered 2012-10-24: 11:00:00 via INTRAVENOUS

## 2012-10-24 NOTE — Addendum Note (Signed)
Addended by: Evelena Leyden on: 10/24/2012 04:12 PM   Modules accepted: Orders

## 2012-10-24 NOTE — Progress Notes (Signed)
[name] presents today for injection per MD orders. B12 1000mcg administered SQ in left Upper Arm. Administration without incident. Patient tolerated well.  

## 2012-10-24 NOTE — Progress Notes (Signed)
Problem number 1 recurrent multiple myeloma receiving palliative therapy. She has chronic pain due to fractures of her pubic rami from a combination of osteoporosis and myeloma I suspect. Most of the fractures looked more osteoporotic however. She is on OxyContin 10 mg every 12 hours and was using OxyIR but wants to switched to hydrocodone/APAP which she has received most recently from Dr. Regino Schultze in October. She received 120 and has 0 left. We gave her oxycodone IR but she doesn't feel that that works as well interestingly. I call her pharmacist at [facility] and spoke with [place]. We are going to try to be the sole physicians of choice for her prescriptions for pain medication since we are dropping the OxyIR and going back to the hydrocodone. She will continue the OxyContin 10 mg every 12 hours. Her worst pain is in her low back, pelvis, and right knee where she has degenerative arthritic changes clinically.  Problem #2 severe hypogammaglobulinemia secondary to her myeloma though this is not an issue presently. Problem #3 anemia secondary to above with a poor response to Aranesp which we have stopped. We're transfusing her as needed. Problem #4 chronic lower extremity edema which is much improved. She never remembers to bring her medications with her. Her granddaughter is now living with her full-time. She also does not remember to bring her medications. We have reinforced the need to see them.  We will switch to the hydrocodone as mentioned above. Her laboratory work will be done at the end of this month with her ongoing bone maintenance drugs.  We will see her in 8 weeks and the goal is continued pain control and palliation

## 2012-10-24 NOTE — Patient Instructions (Addendum)
[facility] Discharge Instructions  RECOMMENDATIONS MADE BY THE CONSULTANT AND ANY TEST RESULTS WILL BE SENT TO YOUR REFERRING PHYSICIAN.  EXAM FINDINGS BY THE PHYSICIAN TODAY AND SIGNS OR SYMPTOMS TO REPORT TO CLINIC OR PRIMARY PHYSICIAN: exam and discussion by MD.  MEDICATIONS PRESCRIBED:  Will stop the oxydocone and call in RX for Hydrocodone 10-325 - you can take 1 pill every 6 hours as needed for pain Prescription for Oxycontin 10 mg  INSTRUCTIONS GIVEN AND DISCUSSED: Report uncontrolled pain, shortness of breath or other problems. When you come to see the MD please bring all of the medications that your are taking.  We only want 1 physician writing your pain medication prescriptions.  SPECIAL INSTRUCTIONS/FOLLOW-UP: Blood work at the end of January and to be seen in follow-up in 2 months.  Thank you for choosing [facility] to provide your oncology and hematology care.  To afford each patient quality time with our providers, please arrive at least 15 minutes before your scheduled appointment time.  With your help, our goal is to use those 15 minutes to complete the necessary work-up to ensure our physicians have the information they need to help with your evaluation and healthcare recommendations.    Effective January 1st, 2014, we ask that you re-schedule your appointment with our physicians should you arrive 10 or more minutes late for your appointment.  We strive to give you quality time with our providers, and arriving late affects you and other patients whose appointments are after yours.    Again, thank you for choosing [facility].  Our hope is that these requests will decrease the amount of time that you wait before being seen by our physicians.       _____________________________________________________________  I acknowledge that I have been informed and understand all the instructions given to me and received a copy. I do not  have anymore questions at this time but understand that I may call the Cancer Center at [facility] at [phone] during business hours should I have any further questions or need assistance in obtaining follow-up care.    __________________________________________  _____________  __________ Signature of Patient or Authorized Representative            Date                   Time    __________________________________________ Nurse's Signature

## 2012-10-27 ENCOUNTER — Ambulatory Visit (HOSPITAL_COMMUNITY): Payer: Medicare Other | Admitting: Oncology

## 2012-10-29 ENCOUNTER — Other Ambulatory Visit (HOSPITAL_COMMUNITY): Payer: Self-pay | Admitting: Oncology

## 2012-10-29 DIAGNOSIS — C9000 Multiple myeloma not having achieved remission: Secondary | ICD-10-CM

## 2012-10-29 MED ORDER — OXYCODONE HCL 5 MG PO TABS: 5.0000 mg | ORAL_TABLET | ORAL | Status: DC | PRN

## 2012-11-10 ENCOUNTER — Telehealth (HOSPITAL_COMMUNITY): Payer: Self-pay | Admitting: *Deleted

## 2012-11-10 NOTE — Telephone Encounter (Signed)
Lorazepam and compazine suppositories called into [facility].

## 2012-11-11 ENCOUNTER — Ambulatory Visit (HOSPITAL_COMMUNITY): Payer: Medicare Other

## 2012-11-17 ENCOUNTER — Encounter (HOSPITAL_BASED_OUTPATIENT_CLINIC_OR_DEPARTMENT_OTHER): Payer: Medicare Other

## 2012-11-17 DIAGNOSIS — C9000 Multiple myeloma not having achieved remission: Secondary | ICD-10-CM

## 2012-11-17 LAB — COMPREHENSIVE METABOLIC PANEL
AST: 15 U/L (ref 0–37)
Albumin: 3.9 g/dL (ref 3.5–5.2)
Alkaline Phosphatase: 151 U/L — ABNORMAL HIGH (ref 39–117)
BUN: 31 mg/dL — ABNORMAL HIGH (ref 6–23)
Chloride: 100 mEq/L (ref 96–112)
GFR calc Af Amer: 29 mL/min — ABNORMAL LOW (ref >90)
Potassium: 4 mEq/L (ref 3.5–5.1)
Sodium: 136 mEq/L (ref 135–145)
Total Protein: 6.9 g/dL (ref 6.0–8.3)

## 2012-11-17 LAB — CBC WITH DIFFERENTIAL/PLATELET
Basophils Absolute: 0 10*3/uL (ref 0.0–0.1)
Basophils Relative: 0 % (ref 0–1)
Hemoglobin: 11 g/dL — ABNORMAL LOW (ref 12.0–15.0)
Lymphocytes Relative: 9 % — ABNORMAL LOW (ref 12–46)
MCHC: 31.9 g/dL (ref 30.0–36.0)
Monocytes Relative: 10 % (ref 3–12)
Neutro Abs: 4.8 10*3/uL (ref 1.7–7.7)
Neutrophils Relative %: 79 % — ABNORMAL HIGH (ref 43–77)
RDW: 14.9 % (ref 11.5–15.5)
WBC: 6.1 10*3/uL (ref 4.0–10.5)

## 2012-11-17 NOTE — Progress Notes (Signed)
Labs drawn today for cbc/diff,cmp,kllc,mm panel

## 2012-11-18 LAB — KAPPA/LAMBDA LIGHT CHAINS
Kappa, lambda light chain ratio: 0.05 — ABNORMAL LOW (ref 0.26–1.65)
Lambda free light chains: 49.9 mg/dL — ABNORMAL HIGH (ref 0.57–2.63)

## 2012-11-19 LAB — MULTIPLE MYELOMA PANEL, SERUM
Alpha-2-Globulin: 12.8 % — ABNORMAL HIGH (ref 7.1–11.8)
Beta 2: 4.6 % (ref 3.2–6.5)
Gamma Globulin: 13.5 % (ref 11.1–18.8)
IgG (Immunoglobin G), Serum: 785 mg/dL (ref 690–1700)
M-Spike, %: 0.36 g/dL

## 2012-11-21 ENCOUNTER — Ambulatory Visit (HOSPITAL_COMMUNITY): Payer: Medicare Other

## 2012-11-21 ENCOUNTER — Telehealth (HOSPITAL_COMMUNITY): Payer: Self-pay | Admitting: Oncology

## 2012-11-21 ENCOUNTER — Encounter (HOSPITAL_BASED_OUTPATIENT_CLINIC_OR_DEPARTMENT_OTHER): Payer: Medicare Other

## 2012-11-21 ENCOUNTER — Other Ambulatory Visit (HOSPITAL_COMMUNITY): Payer: Self-pay | Admitting: Oncology

## 2012-11-21 VITALS — BP 101/69 | HR 74 | Temp 98.1°F | Resp 18

## 2012-11-21 DIAGNOSIS — E538 Deficiency of other specified B group vitamins: Secondary | ICD-10-CM

## 2012-11-21 DIAGNOSIS — C9000 Multiple myeloma not having achieved remission: Secondary | ICD-10-CM

## 2012-11-21 DIAGNOSIS — M545 Low back pain: Secondary | ICD-10-CM

## 2012-11-21 DIAGNOSIS — M810 Age-related osteoporosis without current pathological fracture: Secondary | ICD-10-CM

## 2012-11-21 DIAGNOSIS — G8929 Other chronic pain: Secondary | ICD-10-CM

## 2012-11-21 MED ORDER — CYANOCOBALAMIN 1000 MCG/ML IJ SOLN
INTRAMUSCULAR | Status: AC
  Filled 2012-11-21: qty 1

## 2012-11-21 MED ORDER — HEPARIN SOD (PORK) LOCK FLUSH 100 UNIT/ML IV SOLN
500.0000 [IU] | Freq: Once | INTRAVENOUS | Status: AC
  Administered 2012-11-21: 500 [IU] via INTRAVENOUS
  Filled 2012-11-21: qty 5

## 2012-11-21 MED ORDER — SODIUM CHLORIDE 0.9 % IJ SOLN
10.0000 mL | Freq: Once | INTRAMUSCULAR | Status: DC
  Filled 2012-11-21: qty 10

## 2012-11-21 MED ORDER — OXYCODONE HCL 10 MG PO TB12: 10.0000 mg | ORAL_TABLET | Freq: Three times a day (TID) | ORAL | Status: DC

## 2012-11-21 MED ORDER — DARBEPOETIN ALFA-POLYSORBATE 500 MCG/ML IJ SOLN
INTRAMUSCULAR | Status: AC
  Filled 2012-11-21: qty 1

## 2012-11-21 MED ORDER — SODIUM CHLORIDE 0.9 % IV SOLN
Freq: Once | INTRAVENOUS | Status: AC
  Administered 2012-11-21: 10:00:00 via INTRAVENOUS

## 2012-11-21 MED ORDER — DARBEPOETIN ALFA-POLYSORBATE 500 MCG/ML IJ SOLN: 500.0000 ug | Freq: Once | INTRAMUSCULAR | Status: DC

## 2012-11-21 MED ORDER — HEPARIN SOD (PORK) LOCK FLUSH 100 UNIT/ML IV SOLN
INTRAVENOUS | Status: AC
  Filled 2012-11-21: qty 5

## 2012-11-21 MED ORDER — CYANOCOBALAMIN 1000 MCG/ML IJ SOLN
1000.0000 ug | Freq: Once | INTRAMUSCULAR | Status: AC
  Administered 2012-11-21: 1000 ug via SUBCUTANEOUS

## 2012-11-21 MED ORDER — ZOLEDRONIC ACID 4 MG/5ML IV CONC
2.0000 mg | Freq: Once | INTRAVENOUS | Status: AC
  Administered 2012-11-21: 2 mg via INTRAVENOUS
  Filled 2012-11-21: qty 2.5

## 2012-11-21 NOTE — Telephone Encounter (Signed)
MAILED OXYCOTIN RX TO [facility]

## 2012-11-21 NOTE — Progress Notes (Signed)
[name] presents today for injection and zometa per MD orders. B12 1000 mcg administered im in right Upper Arm. Administration without incident. Patient tolerated injection and infusion well.

## 2012-11-28 ENCOUNTER — Telehealth (HOSPITAL_COMMUNITY): Payer: Self-pay | Admitting: Oncology

## 2012-11-28 ENCOUNTER — Telehealth (HOSPITAL_COMMUNITY): Payer: Self-pay | Admitting: *Deleted

## 2012-11-28 NOTE — Telephone Encounter (Signed)
Pt had xanax 0.5 # 90, written as take 1 at bed time prn sleep(so it appears to be a 3 month supply). She was using some of these periodically during the day and so she ran out sooner than 3 months. She is ok with continuing to use the xanax but needs Rx written differently.

## 2012-11-28 NOTE — Telephone Encounter (Signed)
Refills called in on lasix and instructions on new xanax rx changed to 1 TID as needed for anxiety.

## 2012-12-01 ENCOUNTER — Other Ambulatory Visit (HOSPITAL_COMMUNITY): Payer: Self-pay | Admitting: Oncology

## 2012-12-01 DIAGNOSIS — G8929 Other chronic pain: Secondary | ICD-10-CM

## 2012-12-01 DIAGNOSIS — C9000 Multiple myeloma not having achieved remission: Secondary | ICD-10-CM

## 2012-12-01 MED ORDER — OXYCODONE HCL 10 MG PO TB12: 10.0000 mg | ORAL_TABLET | Freq: Three times a day (TID) | ORAL | Status: DC

## 2012-12-19 ENCOUNTER — Ambulatory Visit (HOSPITAL_COMMUNITY): Payer: Medicare Other

## 2012-12-22 ENCOUNTER — Encounter (HOSPITAL_COMMUNITY): Payer: Medicare Other | Attending: Oncology | Admitting: Oncology

## 2012-12-22 ENCOUNTER — Encounter (HOSPITAL_COMMUNITY): Payer: Self-pay | Admitting: Oncology

## 2012-12-22 ENCOUNTER — Encounter (HOSPITAL_BASED_OUTPATIENT_CLINIC_OR_DEPARTMENT_OTHER): Payer: Medicare Other

## 2012-12-22 VITALS — BP 119/79 | HR 89 | Temp 97.9°F | Resp 20 | Wt 211.9 lb

## 2012-12-22 DIAGNOSIS — IMO0002 Reserved for concepts with insufficient information to code with codable children: Secondary | ICD-10-CM | POA: Insufficient documentation

## 2012-12-22 DIAGNOSIS — R350 Frequency of micturition: Secondary | ICD-10-CM

## 2012-12-22 DIAGNOSIS — N289 Disorder of kidney and ureter, unspecified: Secondary | ICD-10-CM

## 2012-12-22 DIAGNOSIS — C9002 Multiple myeloma in relapse: Secondary | ICD-10-CM

## 2012-12-22 DIAGNOSIS — D696 Thrombocytopenia, unspecified: Secondary | ICD-10-CM

## 2012-12-22 DIAGNOSIS — D510 Vitamin B12 deficiency anemia due to intrinsic factor deficiency: Secondary | ICD-10-CM

## 2012-12-22 DIAGNOSIS — E538 Deficiency of other specified B group vitamins: Secondary | ICD-10-CM

## 2012-12-22 DIAGNOSIS — D638 Anemia in other chronic diseases classified elsewhere: Secondary | ICD-10-CM | POA: Insufficient documentation

## 2012-12-22 DIAGNOSIS — N182 Chronic kidney disease, stage 2 (mild): Secondary | ICD-10-CM

## 2012-12-22 DIAGNOSIS — C9000 Multiple myeloma not having achieved remission: Secondary | ICD-10-CM

## 2012-12-22 DIAGNOSIS — G893 Neoplasm related pain (acute) (chronic): Secondary | ICD-10-CM

## 2012-12-22 DIAGNOSIS — D649 Anemia, unspecified: Secondary | ICD-10-CM

## 2012-12-22 DIAGNOSIS — E669 Obesity, unspecified: Secondary | ICD-10-CM

## 2012-12-22 DIAGNOSIS — N179 Acute kidney failure, unspecified: Secondary | ICD-10-CM | POA: Insufficient documentation

## 2012-12-22 DIAGNOSIS — D630 Anemia in neoplastic disease: Secondary | ICD-10-CM

## 2012-12-22 DIAGNOSIS — K219 Gastro-esophageal reflux disease without esophagitis: Secondary | ICD-10-CM

## 2012-12-22 DIAGNOSIS — M810 Age-related osteoporosis without current pathological fracture: Secondary | ICD-10-CM

## 2012-12-22 LAB — BASIC METABOLIC PANEL
Calcium: 9.9 mg/dL (ref 8.4–10.5)
Creatinine, Ser: 1.92 mg/dL — ABNORMAL HIGH (ref 0.50–1.10)
GFR calc non Af Amer: 24 mL/min — ABNORMAL LOW (ref >90)
Sodium: 136 mEq/L (ref 135–145)

## 2012-12-22 LAB — CBC
Platelets: 132 10*3/uL — ABNORMAL LOW (ref 150–400)
RBC: 3.05 MIL/uL — ABNORMAL LOW (ref 3.87–5.11)
RDW: 15 % (ref 11.5–15.5)
WBC: 6.3 10*3/uL (ref 4.0–10.5)

## 2012-12-22 MED ORDER — HYDROCODONE-ACETAMINOPHEN 5-325 MG PO TABS: 1.0000 | ORAL_TABLET | Freq: Four times a day (QID) | ORAL | Status: DC | PRN

## 2012-12-22 MED ORDER — HEPARIN SOD (PORK) LOCK FLUSH 100 UNIT/ML IV SOLN
INTRAVENOUS | Status: AC
  Filled 2012-12-22: qty 5

## 2012-12-22 MED ORDER — SODIUM CHLORIDE 0.9 % IV SOLN
INTRAVENOUS | Status: DC
  Administered 2012-12-22: 13:00:00 via INTRAVENOUS

## 2012-12-22 MED ORDER — HEPARIN SOD (PORK) LOCK FLUSH 100 UNIT/ML IV SOLN
500.0000 [IU] | Freq: Once | INTRAVENOUS | Status: AC
  Administered 2012-12-22: 500 [IU] via INTRAVENOUS
  Filled 2012-12-22: qty 5

## 2012-12-22 MED ORDER — CYANOCOBALAMIN 1000 MCG/ML IJ SOLN
INTRAMUSCULAR | Status: AC
  Filled 2012-12-22: qty 1

## 2012-12-22 MED ORDER — ZOLEDRONIC ACID 4 MG/5ML IV CONC
2.0000 mg | Freq: Once | INTRAVENOUS | Status: AC
  Administered 2012-12-22: 2 mg via INTRAVENOUS
  Filled 2012-12-22: qty 2.5

## 2012-12-22 MED ORDER — DARBEPOETIN ALFA-POLYSORBATE 500 MCG/ML IJ SOLN
INTRAMUSCULAR | Status: AC
  Filled 2012-12-22: qty 1

## 2012-12-22 MED ORDER — CYANOCOBALAMIN 1000 MCG/ML IJ SOLN
1000.0000 ug | Freq: Once | INTRAMUSCULAR | Status: AC
  Administered 2012-12-22: 1000 ug via SUBCUTANEOUS

## 2012-12-22 MED ORDER — DARBEPOETIN ALFA-POLYSORBATE 500 MCG/ML IJ SOLN
500.0000 ug | Freq: Once | INTRAMUSCULAR | Status: AC
Start: 2012-12-22 — End: 2012-12-22
  Administered 2012-12-22: 500 ug via SUBCUTANEOUS

## 2012-12-22 NOTE — Addendum Note (Signed)
Addended by: Edythe Lynn A on: 12/22/2012 01:20 PM   Modules accepted: Orders

## 2012-12-22 NOTE — Patient Instructions (Addendum)
.  [facility] Discharge Instructions  RECOMMENDATIONS MADE BY THE CONSULTANT AND ANY TEST RESULTS WILL BE SENT TO YOUR REFERRING PHYSICIAN.  EXAM FINDINGS BY THE PHYSICIAN TODAY AND SIGNS OR SYMPTOMS TO REPORT TO CLINIC OR PRIMARY PHYSICIAN:   MEDICATIONS PRESCRIBED:  Spironolactone daily, take the lasix only as needed. Prescription given for hydrocodone.  INSTRUCTIONS GIVEN AND DISCUSSED: Watch your salt intake Labs, Zometa,  b12, and aranesp today and every 4 weeks.  SPECIAL INSTRUCTIONS/FOLLOW-UP: 2 months to see the Dr. Mariel Sleet  Thank you for choosing [facility] to provide your oncology and hematology care.  To afford each patient quality time with our providers, please arrive at least 15 minutes before your scheduled appointment time.  With your help, our goal is to use those 15 minutes to complete the necessary work-up to ensure our physicians have the information they need to help with your evaluation and healthcare recommendations.    Effective January 1st, 2014, we ask that you re-schedule your appointment with our physicians should you arrive 10 or more minutes late for your appointment.  We strive to give you quality time with our providers, and arriving late affects you and other patients whose appointments are after yours.    Again, thank you for choosing [facility].  Our hope is that these requests will decrease the amount of time that you wait before being seen by our physicians.       _____________________________________________________________  Should you have questions after your visit to [facility], please contact our office at [phone] between the hours of 8:30 a.m. and 5:00 p.m.  Voicemails left after 4:30 p.m. will not be returned until the following business day.  For prescription refill requests, have your pharmacy contact our office with your prescription refill request.

## 2012-12-22 NOTE — Progress Notes (Signed)
[name] presents today for injection per MD orders. Aranesp 500 mcg administered SQ in right Abdomen. Administration without incident. Patient tolerated well. [name] presents today for injection per MD orders. B12 1000 mcg administered IM in right Upper Arm. Administration without incident. Patient tolerated well.

## 2012-12-22 NOTE — Progress Notes (Signed)
#  1 recurrent, recently progressive, multiple myeloma. She has opted to stop all interventional chemotherapy. We are palliating her at the present time. She has pain from her back due to combination of osteoporosis, degenerative arthritis and disc disease, as well as myelomatous involvement. She has a history of fractures of her pubic rami as well. She is accompanied by her granddaughter Geanie Berlin who is living with her full-time and her great granddaughter. She is urinating very frequently but still has adequate vital signs today. They're wondering whether she is urinating too much in getting dehydrated. There is no evidence for that by her laboratory work.  #2 severe hypogammaglobulinemia secondary to her myeloma not an issue presently #3 anemia secondary to above with a poor response to Aranesp in the past which we have stopped. We are transfusing her only as needed #4 chronic lower extremity edema with no obvious edema today.   Vital signs are stable. Lymph nodes are negative throughout. Lungs are clear. Heart shows no obvious S3 gallop. Abdomen remains obese without obvious hepatosplenomegaly. Bowel sounds are present but reduced. She has no leg edema or arm edema.  She is voiding copious amounts according to the granddaughter and her. We are going to eliminate the Lasix and you should spironolactone which is mild her once a day. We will reduce that if we need to every other day and she will use the Lasix only as needed. We talked about all the foods that she is consuming some of which have added salt which I have discouraged.  Her myeloma laboratory work has been very stable lately. We'll continue try to protect her bones. Pain control is obviously the most important issue. She went into a long discussion about an issue she had with obtaining her pain medications recently which I will have to address with our office manager.

## 2012-12-23 ENCOUNTER — Telehealth (HOSPITAL_COMMUNITY): Payer: Self-pay | Admitting: Oncology

## 2012-12-23 ENCOUNTER — Other Ambulatory Visit (HOSPITAL_COMMUNITY): Payer: Self-pay | Admitting: Oncology

## 2012-12-23 DIAGNOSIS — G8929 Other chronic pain: Secondary | ICD-10-CM

## 2012-12-23 DIAGNOSIS — C9000 Multiple myeloma not having achieved remission: Secondary | ICD-10-CM

## 2012-12-23 MED ORDER — OXYCODONE HCL 10 MG PO TB12: 10.0000 mg | ORAL_TABLET | Freq: Three times a day (TID) | ORAL | Status: DC

## 2012-12-23 NOTE — Telephone Encounter (Signed)
Mailed rx to [facility]

## 2012-12-24 LAB — MULTIPLE MYELOMA PANEL, SERUM
Beta 2: 4.9 % (ref 3.2–6.5)
Beta Globulin: 5.5 % (ref 4.7–7.2)
Gamma Globulin: 12.5 % (ref 11.1–18.8)
IgA: 234 mg/dL (ref 69–380)
M-Spike, %: 0.36 g/dL
Total Protein: 6.8 g/dL (ref 6.0–8.3)

## 2012-12-24 LAB — KAPPA/LAMBDA LIGHT CHAINS
Kappa free light chain: 2.96 mg/dL — ABNORMAL HIGH (ref 0.33–1.94)
Kappa, lambda light chain ratio: 0.05 — ABNORMAL LOW (ref 0.26–1.65)
Lambda free light chains: 54.1 mg/dL — ABNORMAL HIGH (ref 0.57–2.63)

## 2012-12-31 ENCOUNTER — Encounter (HOSPITAL_COMMUNITY): Payer: Self-pay

## 2013-01-12 ENCOUNTER — Ambulatory Visit (HOSPITAL_COMMUNITY): Payer: Medicare Other

## 2013-01-19 ENCOUNTER — Encounter (HOSPITAL_BASED_OUTPATIENT_CLINIC_OR_DEPARTMENT_OTHER): Payer: Medicare Other

## 2013-01-19 VITALS — BP 126/76 | HR 70 | Temp 97.8°F | Resp 18

## 2013-01-19 DIAGNOSIS — S32599S Other specified fracture of unspecified pubis, sequela: Secondary | ICD-10-CM

## 2013-01-19 DIAGNOSIS — N289 Disorder of kidney and ureter, unspecified: Secondary | ICD-10-CM

## 2013-01-19 DIAGNOSIS — E669 Obesity, unspecified: Secondary | ICD-10-CM

## 2013-01-19 DIAGNOSIS — D638 Anemia in other chronic diseases classified elsewhere: Secondary | ICD-10-CM

## 2013-01-19 DIAGNOSIS — D510 Vitamin B12 deficiency anemia due to intrinsic factor deficiency: Secondary | ICD-10-CM

## 2013-01-19 DIAGNOSIS — N182 Chronic kidney disease, stage 2 (mild): Secondary | ICD-10-CM

## 2013-01-19 DIAGNOSIS — D696 Thrombocytopenia, unspecified: Secondary | ICD-10-CM

## 2013-01-19 DIAGNOSIS — D649 Anemia, unspecified: Secondary | ICD-10-CM

## 2013-01-19 DIAGNOSIS — K219 Gastro-esophageal reflux disease without esophagitis: Secondary | ICD-10-CM

## 2013-01-19 DIAGNOSIS — E538 Deficiency of other specified B group vitamins: Secondary | ICD-10-CM

## 2013-01-19 DIAGNOSIS — N179 Acute kidney failure, unspecified: Secondary | ICD-10-CM

## 2013-01-19 DIAGNOSIS — C9000 Multiple myeloma not having achieved remission: Secondary | ICD-10-CM

## 2013-01-19 LAB — BASIC METABOLIC PANEL
BUN: 22 mg/dL (ref 6–23)
CO2: 22 mEq/L (ref 19–32)
GFR calc non Af Amer: 33 mL/min — ABNORMAL LOW (ref >90)
Glucose, Bld: 94 mg/dL (ref 70–99)
Potassium: 4.6 mEq/L (ref 3.5–5.1)

## 2013-01-19 LAB — CBC
HCT: 28.3 % — ABNORMAL LOW (ref 36.0–46.0)
Hemoglobin: 9.2 g/dL — ABNORMAL LOW (ref 12.0–15.0)
MCH: 31.6 pg (ref 26.0–34.0)
MCHC: 32.5 g/dL (ref 30.0–36.0)
MCV: 97.3 fL (ref 78.0–100.0)

## 2013-01-19 MED ORDER — ZOLEDRONIC ACID 4 MG/5ML IV CONC
4.0000 mg | Freq: Once | INTRAVENOUS | Status: AC
Start: 2013-01-19 — End: 2013-01-19
  Administered 2013-01-19: 4 mg via INTRAVENOUS
  Filled 2013-01-19: qty 5

## 2013-01-19 MED ORDER — HEPARIN SOD (PORK) LOCK FLUSH 100 UNIT/ML IV SOLN
500.0000 [IU] | Freq: Once | INTRAVENOUS | Status: AC | PRN
  Administered 2013-01-19: 500 [IU]
  Filled 2013-01-19: qty 5

## 2013-01-19 MED ORDER — CYANOCOBALAMIN 1000 MCG/ML IJ SOLN
1000.0000 ug | Freq: Once | INTRAMUSCULAR | Status: AC
  Administered 2013-01-19: 1000 ug via INTRAMUSCULAR

## 2013-01-19 MED ORDER — DARBEPOETIN ALFA-POLYSORBATE 500 MCG/ML IJ SOLN
INTRAMUSCULAR | Status: AC
  Filled 2013-01-19: qty 1

## 2013-01-19 MED ORDER — CYANOCOBALAMIN 1000 MCG/ML IJ SOLN: 1000.0000 ug | Freq: Once | INTRAMUSCULAR | Status: DC

## 2013-01-19 MED ORDER — CYANOCOBALAMIN 1000 MCG/ML IJ SOLN
INTRAMUSCULAR | Status: AC
  Filled 2013-01-19: qty 1

## 2013-01-19 MED ORDER — DARBEPOETIN ALFA-POLYSORBATE 500 MCG/ML IJ SOLN
500.0000 ug | Freq: Once | INTRAMUSCULAR | Status: AC
  Administered 2013-01-19: 500 ug via SUBCUTANEOUS

## 2013-01-19 MED ORDER — SODIUM CHLORIDE 0.9 % IV SOLN
Freq: Once | INTRAVENOUS | Status: AC
  Administered 2013-01-19: 12:00:00 via INTRAVENOUS

## 2013-01-19 MED ORDER — HEPARIN SOD (PORK) LOCK FLUSH 100 UNIT/ML IV SOLN
INTRAVENOUS | Status: AC
  Filled 2013-01-19: qty 5

## 2013-01-19 NOTE — Progress Notes (Signed)
Labs drawn today for cbc,bmp 

## 2013-01-19 NOTE — Progress Notes (Signed)
[name] presents today for injection per MD orders. Aranesp 500 mcg administered SQ in right Abdomen. Administration without incident. Patient tolerated well. [name] presents today for injection per MD orders. B12 1000 mcg administered IM in right Upper Arm. Administration without incident. Patient tolerated well.

## 2013-01-20 ENCOUNTER — Telehealth (HOSPITAL_COMMUNITY): Payer: Self-pay | Admitting: Oncology

## 2013-01-20 ENCOUNTER — Other Ambulatory Visit (HOSPITAL_COMMUNITY): Payer: Self-pay | Admitting: Oncology

## 2013-01-20 DIAGNOSIS — G8929 Other chronic pain: Secondary | ICD-10-CM

## 2013-01-20 DIAGNOSIS — C9000 Multiple myeloma not having achieved remission: Secondary | ICD-10-CM

## 2013-01-20 MED ORDER — OXYCODONE HCL 10 MG PO TB12: 10.0000 mg | ORAL_TABLET | Freq: Three times a day (TID) | ORAL | Status: DC

## 2013-01-20 NOTE — Telephone Encounter (Signed)
MAILED OXYCODONE RX TO [facility]

## 2013-01-21 ENCOUNTER — Encounter: Payer: Self-pay | Admitting: Oncology

## 2013-01-26 ENCOUNTER — Telehealth (HOSPITAL_COMMUNITY): Payer: Self-pay | Admitting: Oncology

## 2013-01-26 ENCOUNTER — Other Ambulatory Visit (HOSPITAL_COMMUNITY): Payer: Self-pay | Admitting: Oncology

## 2013-01-26 DIAGNOSIS — C9000 Multiple myeloma not having achieved remission: Secondary | ICD-10-CM

## 2013-01-26 MED ORDER — POTASSIUM CHLORIDE CRYS ER 20 MEQ PO TBCR: 20.0000 meq | EXTENDED_RELEASE_TABLET | Freq: Every day | ORAL | Status: DC

## 2013-01-26 MED ORDER — FOLIC ACID 1 MG PO TABS: 1.0000 mg | ORAL_TABLET | Freq: Every day | ORAL | Status: DC

## 2013-01-29 ENCOUNTER — Other Ambulatory Visit (HOSPITAL_COMMUNITY): Payer: Self-pay | Admitting: Oncology

## 2013-01-29 DIAGNOSIS — C9000 Multiple myeloma not having achieved remission: Secondary | ICD-10-CM

## 2013-01-29 MED ORDER — ALPRAZOLAM 0.5 MG PO TABS: 0.5000 mg | ORAL_TABLET | Freq: Every evening | ORAL | Status: DC | PRN

## 2013-02-02 ENCOUNTER — Ambulatory Visit (HOSPITAL_COMMUNITY): Payer: Medicare Other

## 2013-02-16 ENCOUNTER — Encounter (HOSPITAL_COMMUNITY): Payer: Medicare Other | Attending: Oncology

## 2013-02-16 ENCOUNTER — Encounter (HOSPITAL_COMMUNITY): Payer: Medicare Other

## 2013-02-16 VITALS — BP 135/104 | HR 103 | Temp 98.2°F | Resp 20

## 2013-02-16 DIAGNOSIS — Z79899 Other long term (current) drug therapy: Secondary | ICD-10-CM | POA: Insufficient documentation

## 2013-02-16 DIAGNOSIS — D638 Anemia in other chronic diseases classified elsewhere: Secondary | ICD-10-CM | POA: Insufficient documentation

## 2013-02-16 DIAGNOSIS — D696 Thrombocytopenia, unspecified: Secondary | ICD-10-CM | POA: Insufficient documentation

## 2013-02-16 DIAGNOSIS — E538 Deficiency of other specified B group vitamins: Secondary | ICD-10-CM | POA: Insufficient documentation

## 2013-02-16 DIAGNOSIS — E669 Obesity, unspecified: Secondary | ICD-10-CM

## 2013-02-16 DIAGNOSIS — N289 Disorder of kidney and ureter, unspecified: Secondary | ICD-10-CM

## 2013-02-16 DIAGNOSIS — D510 Vitamin B12 deficiency anemia due to intrinsic factor deficiency: Secondary | ICD-10-CM

## 2013-02-16 DIAGNOSIS — N179 Acute kidney failure, unspecified: Secondary | ICD-10-CM

## 2013-02-16 DIAGNOSIS — N182 Chronic kidney disease, stage 2 (mild): Secondary | ICD-10-CM | POA: Insufficient documentation

## 2013-02-16 DIAGNOSIS — C9000 Multiple myeloma not having achieved remission: Secondary | ICD-10-CM

## 2013-02-16 DIAGNOSIS — K219 Gastro-esophageal reflux disease without esophagitis: Secondary | ICD-10-CM

## 2013-02-16 DIAGNOSIS — D649 Anemia, unspecified: Secondary | ICD-10-CM | POA: Insufficient documentation

## 2013-02-16 DIAGNOSIS — S32599S Other specified fracture of unspecified pubis, sequela: Secondary | ICD-10-CM

## 2013-02-16 LAB — BASIC METABOLIC PANEL
BUN: 40 mg/dL — ABNORMAL HIGH (ref 6–23)
Calcium: 9.7 mg/dL (ref 8.4–10.5)
Creatinine, Ser: 1.84 mg/dL — ABNORMAL HIGH (ref 0.50–1.10)
GFR calc non Af Amer: 25 mL/min — ABNORMAL LOW (ref >90)
Glucose, Bld: 108 mg/dL — ABNORMAL HIGH (ref 70–99)

## 2013-02-16 LAB — CBC
Hemoglobin: 9.2 g/dL — ABNORMAL LOW (ref 12.0–15.0)
Platelets: 129 10*3/uL — ABNORMAL LOW (ref 150–400)
RBC: 2.85 MIL/uL — ABNORMAL LOW (ref 3.87–5.11)
WBC: 5.8 10*3/uL (ref 4.0–10.5)

## 2013-02-16 MED ORDER — ZOLEDRONIC ACID 4 MG/5ML IV CONC
3.3000 mg | Freq: Once | INTRAVENOUS | Status: AC
  Administered 2013-02-16: 3.3 mg via INTRAVENOUS
  Filled 2013-02-16: qty 4.13

## 2013-02-16 MED ORDER — SODIUM CHLORIDE 0.9 % IV SOLN
Freq: Once | INTRAVENOUS | Status: AC
  Administered 2013-02-16: 11:00:00 via INTRAVENOUS

## 2013-02-16 MED ORDER — SODIUM CHLORIDE 0.9 % IJ SOLN
10.0000 mL | INTRAMUSCULAR | Status: DC | PRN
  Administered 2013-02-16: 10 mL
  Filled 2013-02-16: qty 10

## 2013-02-16 MED ORDER — HEPARIN SOD (PORK) LOCK FLUSH 100 UNIT/ML IV SOLN
250.0000 [IU] | Freq: Once | INTRAVENOUS | Status: AC | PRN
  Administered 2013-02-16: 500 [IU]
  Filled 2013-02-16: qty 5

## 2013-02-16 MED ORDER — HEPARIN SOD (PORK) LOCK FLUSH 100 UNIT/ML IV SOLN
INTRAVENOUS | Status: AC
  Filled 2013-02-16: qty 5

## 2013-02-16 MED ORDER — DARBEPOETIN ALFA-POLYSORBATE 500 MCG/ML IJ SOLN
INTRAMUSCULAR | Status: AC
  Filled 2013-02-16: qty 1

## 2013-02-16 MED ORDER — CYANOCOBALAMIN 1000 MCG/ML IJ SOLN
INTRAMUSCULAR | Status: AC
  Filled 2013-02-16: qty 1

## 2013-02-16 MED ORDER — CYANOCOBALAMIN 1000 MCG/ML IJ SOLN
1000.0000 ug | Freq: Once | INTRAMUSCULAR | Status: AC
  Administered 2013-02-16: 1000 ug via INTRAMUSCULAR

## 2013-02-16 MED ORDER — ZOLEDRONIC ACID 4 MG/5ML IV CONC: 4.0000 mg | Freq: Once | INTRAVENOUS | Status: DC

## 2013-02-16 MED ORDER — DARBEPOETIN ALFA-POLYSORBATE 500 MCG/ML IJ SOLN
500.0000 ug | Freq: Once | INTRAMUSCULAR | Status: AC
  Administered 2013-02-16: 500 ug via SUBCUTANEOUS

## 2013-02-16 NOTE — Progress Notes (Signed)
Specimen obtained today from [facility] for labs. Good blood return.   Vitamin b-12 given IM Z-track to right deltoid muscle. Aranesp given sub-q to lower abd tissue. Zometa  3.3 mg given iv over 15 mins.  Pt tolerated all well.

## 2013-02-17 ENCOUNTER — Telehealth (HOSPITAL_COMMUNITY): Payer: Self-pay

## 2013-02-17 LAB — KAPPA/LAMBDA LIGHT CHAINS: Kappa free light chain: 2.45 mg/dL — ABNORMAL HIGH (ref 0.33–1.94)

## 2013-02-17 NOTE — Telephone Encounter (Signed)
Call from [place] and she does not want her mother to be told any "bad news". Stated "If she's told that things are getting a lot worse she'll just worry more than she is already.  She has an appointment to see Dr. Mariel Sleet on 5/5 and I plan to come but may be a little late getting there."

## 2013-02-18 LAB — MULTIPLE MYELOMA PANEL, SERUM
Alpha-1-Globulin: 4.5 % (ref 2.9–4.9)
Alpha-2-Globulin: 12.1 % — ABNORMAL HIGH (ref 7.1–11.8)
IgA: 279 mg/dL (ref 69–380)
IgG (Immunoglobin G), Serum: 671 mg/dL — ABNORMAL LOW (ref 690–1700)
IgM, Serum: 43 mg/dL — ABNORMAL LOW (ref 52–322)
Total Protein: 6.2 g/dL (ref 6.0–8.3)

## 2013-02-19 ENCOUNTER — Other Ambulatory Visit (HOSPITAL_COMMUNITY): Payer: Self-pay | Admitting: Oncology

## 2013-02-19 ENCOUNTER — Telehealth (HOSPITAL_COMMUNITY): Payer: Self-pay | Admitting: *Deleted

## 2013-02-19 DIAGNOSIS — K219 Gastro-esophageal reflux disease without esophagitis: Secondary | ICD-10-CM

## 2013-02-19 DIAGNOSIS — E538 Deficiency of other specified B group vitamins: Secondary | ICD-10-CM

## 2013-02-19 DIAGNOSIS — D649 Anemia, unspecified: Secondary | ICD-10-CM

## 2013-02-19 DIAGNOSIS — C9000 Multiple myeloma not having achieved remission: Secondary | ICD-10-CM

## 2013-02-19 DIAGNOSIS — E669 Obesity, unspecified: Secondary | ICD-10-CM

## 2013-02-19 DIAGNOSIS — N289 Disorder of kidney and ureter, unspecified: Secondary | ICD-10-CM

## 2013-02-19 DIAGNOSIS — D510 Vitamin B12 deficiency anemia due to intrinsic factor deficiency: Secondary | ICD-10-CM

## 2013-02-19 DIAGNOSIS — N182 Chronic kidney disease, stage 2 (mild): Secondary | ICD-10-CM

## 2013-02-19 DIAGNOSIS — D696 Thrombocytopenia, unspecified: Secondary | ICD-10-CM

## 2013-02-19 MED ORDER — HYDROCODONE-ACETAMINOPHEN 5-325 MG PO TABS: 1.0000 | ORAL_TABLET | Freq: Four times a day (QID) | ORAL | Status: DC | PRN

## 2013-02-19 NOTE — Telephone Encounter (Signed)
[name] called and wanted you to refill her Hydrocodone please.

## 2013-02-19 NOTE — Telephone Encounter (Signed)
Rx called in to [facility]

## 2013-02-20 ENCOUNTER — Other Ambulatory Visit (HOSPITAL_COMMUNITY): Payer: Self-pay | Admitting: Oncology

## 2013-02-20 DIAGNOSIS — C9000 Multiple myeloma not having achieved remission: Secondary | ICD-10-CM

## 2013-02-20 DIAGNOSIS — M545 Low back pain, unspecified: Secondary | ICD-10-CM

## 2013-02-20 DIAGNOSIS — G8929 Other chronic pain: Secondary | ICD-10-CM

## 2013-02-20 MED ORDER — OXYCODONE HCL 10 MG PO TB12: 10.0000 mg | ORAL_TABLET | Freq: Three times a day (TID) | ORAL | Status: DC

## 2013-02-23 ENCOUNTER — Telehealth (HOSPITAL_COMMUNITY): Payer: Self-pay | Admitting: *Deleted

## 2013-02-23 ENCOUNTER — Encounter (HOSPITAL_COMMUNITY): Payer: Medicare Other | Attending: Oncology | Admitting: Oncology

## 2013-02-23 ENCOUNTER — Ambulatory Visit (HOSPITAL_COMMUNITY): Payer: Medicare Other

## 2013-02-23 ENCOUNTER — Ambulatory Visit (HOSPITAL_COMMUNITY)
Admission: RE | Admit: 2013-02-23 | Discharge: 2013-02-23 | Disposition: A | Discharge status: Home / Self Care | Payer: Medicare Other | Source: Ambulatory Visit | Attending: Oncology | Admitting: Oncology

## 2013-02-23 VITALS — BP 142/76 | HR 79 | Temp 97.5°F | Resp 20 | Wt 211.9 lb

## 2013-02-23 DIAGNOSIS — N182 Chronic kidney disease, stage 2 (mild): Secondary | ICD-10-CM

## 2013-02-23 DIAGNOSIS — D510 Vitamin B12 deficiency anemia due to intrinsic factor deficiency: Secondary | ICD-10-CM

## 2013-02-23 DIAGNOSIS — M810 Age-related osteoporosis without current pathological fracture: Secondary | ICD-10-CM | POA: Insufficient documentation

## 2013-02-23 DIAGNOSIS — K219 Gastro-esophageal reflux disease without esophagitis: Secondary | ICD-10-CM

## 2013-02-23 DIAGNOSIS — D649 Anemia, unspecified: Secondary | ICD-10-CM

## 2013-02-23 DIAGNOSIS — C9000 Multiple myeloma not having achieved remission: Secondary | ICD-10-CM

## 2013-02-23 DIAGNOSIS — C9002 Multiple myeloma in relapse: Secondary | ICD-10-CM | POA: Insufficient documentation

## 2013-02-23 DIAGNOSIS — D696 Thrombocytopenia, unspecified: Secondary | ICD-10-CM

## 2013-02-23 DIAGNOSIS — IMO0002 Reserved for concepts with insufficient information to code with codable children: Secondary | ICD-10-CM | POA: Insufficient documentation

## 2013-02-23 DIAGNOSIS — D630 Anemia in neoplastic disease: Secondary | ICD-10-CM | POA: Insufficient documentation

## 2013-02-23 DIAGNOSIS — M25519 Pain in unspecified shoulder: Secondary | ICD-10-CM | POA: Insufficient documentation

## 2013-02-23 DIAGNOSIS — E669 Obesity, unspecified: Secondary | ICD-10-CM

## 2013-02-23 DIAGNOSIS — N289 Disorder of kidney and ureter, unspecified: Secondary | ICD-10-CM

## 2013-02-23 DIAGNOSIS — M549 Dorsalgia, unspecified: Secondary | ICD-10-CM | POA: Insufficient documentation

## 2013-02-23 DIAGNOSIS — D801 Nonfamilial hypogammaglobulinemia: Secondary | ICD-10-CM | POA: Insufficient documentation

## 2013-02-23 DIAGNOSIS — M719 Bursopathy, unspecified: Secondary | ICD-10-CM | POA: Insufficient documentation

## 2013-02-23 DIAGNOSIS — M67919 Unspecified disorder of synovium and tendon, unspecified shoulder: Secondary | ICD-10-CM | POA: Insufficient documentation

## 2013-02-23 DIAGNOSIS — E538 Deficiency of other specified B group vitamins: Secondary | ICD-10-CM

## 2013-02-23 DIAGNOSIS — M479 Spondylosis, unspecified: Secondary | ICD-10-CM | POA: Insufficient documentation

## 2013-02-23 DIAGNOSIS — R609 Edema, unspecified: Secondary | ICD-10-CM | POA: Insufficient documentation

## 2013-02-23 MED ORDER — HYDROCODONE-ACETAMINOPHEN 5-325 MG PO TABS: 1.0000 | ORAL_TABLET | Freq: Four times a day (QID) | ORAL | Status: DC | PRN

## 2013-02-23 NOTE — Telephone Encounter (Signed)
Mailed the Oxycontin  10Mg   Tabs to [facility] [address] [address]

## 2013-02-23 NOTE — Patient Instructions (Addendum)
[facility] Discharge Instructions  RECOMMENDATIONS MADE BY THE CONSULTANT AND ANY TEST RESULTS WILL BE SENT TO YOUR REFERRING PHYSICIAN.  A Wretha Laris prescription for hydrocodone #100 tablets was sent to [facility]. Continue every 4 weeks lab work and zometa and b12 injections. Return to clinic in 3 months to see MD. Report any issues/concerns as needed.  Thank you for choosing [facility] to provide your oncology and hematology care.  To afford each patient quality time with our providers, please arrive at least 15 minutes before your scheduled appointment time.  With your help, our goal is to use those 15 minutes to complete the necessary work-up to ensure our physicians have the information they need to help with your evaluation and healthcare recommendations.    Effective January 1st, 2014, we ask that you re-schedule your appointment with our physicians should you arrive 10 or more minutes late for your appointment.  We strive to give you quality time with our providers, and arriving late affects you and other patients whose appointments are after yours.    Again, thank you for choosing [facility].  Our hope is that these requests will decrease the amount of time that you wait before being seen by our physicians.       _____________________________________________________________  Should you have questions after your visit to [facility], please contact our office at [phone] between the hours of 8:30 a.m. and 5:00 p.m.  Voicemails left after 4:30 p.m. will not be returned until the following business day.  For prescription refill requests, have your pharmacy contact our office with your prescription refill request.

## 2013-02-23 NOTE — Progress Notes (Signed)
#  1 recurrent, recently progressive, multiple myeloma. Her lambda light chains are rising but her monoclonal M spike is stable for at least the last 6 months. Other lab work is stable. #2 severe right shoulder pain #3 severe back pain secondary to a combination of osteoporosis, degenerative arthritis, degenerative disc disease, as well as myelomatous involvement. She does have a history of fracture pubic rami #4 severe hypogammaglobulinemia secondary to her myeloma not an issue presently #5 anemia secondary to her myeloma with a poor response to Aranesp which has been stopped and we are transfusing her only as needed #6 chronic lower chin edema very nicely control with this for a lactone.  The was in for her back pain and shoulder pain she would be feeling great. She has a good appetite. Her legs are well-controlled sores edema factors no pitting edema today whatsoever. She is in no acute distress. She still on OxyContin 10 mg every 8 hours. She uses between 4 and 6-8 hydrocodone per day.  Her shoulder has a worse crepitations in it but I have ever felt. She has tremendous limitation of range of motion of that right shoulder but she can still feed herself.  I want to get an x-ray to make sure nothing is new or different. On make sure she does not need radiation therapy. I will be we can do much for her back. The goal is to keep her comfortable. She is out essentially 7 years from presentation but has opted not to take more chemotherapy which is fine. We are palliating her as best we can.

## 2013-02-25 ENCOUNTER — Telehealth (HOSPITAL_COMMUNITY): Payer: Self-pay

## 2013-02-25 NOTE — Telephone Encounter (Signed)
Spoke with [name] and she will talk with her mom and then decide what to do.

## 2013-02-25 NOTE — Telephone Encounter (Signed)
Message copied by Evelena Leyden on Wed Feb 25, 2013  2:40 PM ------      Message from: Mariel Sleet, ERIC S      Created: Wed Feb 25, 2013 11:50 AM       Call [name] and tell her the best Dr Hilda Lias is to try an injection to see if that would help for a few weeks      They can call for app't if they want to proceed      ----- Message -----         From: Darreld Mclean, MD         Sent: 02/25/2013   7:36 AM           To: Randall An, MD            I can see her again and inject the shoulder with DepoMedrol.  It should give good relief for a few weeks.  She can call the office for appointment.            Thanks,            Deniece Portela                  ----- Message -----         From: Randall An, MD         Sent: 02/23/2013   1:16 PM           To: Darreld Mclean, MD            This patient said she saw you once about her shoulder      She has myeloma but also R rotator cuff tear.      I did her shoulder x-ray today since she has such severe pain still and decreased ROM      Is there anything to help her at this point?      Thanks      Minerva Areola             ------

## 2013-02-27 ENCOUNTER — Other Ambulatory Visit (HOSPITAL_COMMUNITY): Payer: Self-pay | Admitting: Oncology

## 2013-02-27 ENCOUNTER — Telehealth (HOSPITAL_COMMUNITY): Payer: Self-pay | Admitting: Oncology

## 2013-02-27 NOTE — Telephone Encounter (Signed)
Patient notified that prescription was mailed on 02/20/13.

## 2013-02-27 NOTE — Telephone Encounter (Signed)
Rx written on 5/2.  I was told [facility] mailed the Rx that day.

## 2013-03-02 ENCOUNTER — Other Ambulatory Visit (HOSPITAL_COMMUNITY): Payer: Self-pay | Admitting: Oncology

## 2013-03-02 ENCOUNTER — Telehealth (HOSPITAL_COMMUNITY): Payer: Self-pay

## 2013-03-02 DIAGNOSIS — C9000 Multiple myeloma not having achieved remission: Secondary | ICD-10-CM

## 2013-03-02 MED ORDER — ALPRAZOLAM 0.5 MG PO TABS: 0.5000 mg | ORAL_TABLET | Freq: Three times a day (TID) | ORAL | Status: DC | PRN

## 2013-03-02 NOTE — Telephone Encounter (Signed)
[name] notified that Rx for xanax was called to [facility] by Dellis Anes , PA-C with correct instructions.

## 2013-03-12 ENCOUNTER — Telehealth (HOSPITAL_COMMUNITY): Payer: Self-pay | Admitting: Oncology

## 2013-03-12 ENCOUNTER — Other Ambulatory Visit (HOSPITAL_COMMUNITY): Payer: Self-pay | Admitting: Oncology

## 2013-03-12 DIAGNOSIS — E669 Obesity, unspecified: Secondary | ICD-10-CM

## 2013-03-12 DIAGNOSIS — K219 Gastro-esophageal reflux disease without esophagitis: Secondary | ICD-10-CM

## 2013-03-12 DIAGNOSIS — N289 Disorder of kidney and ureter, unspecified: Secondary | ICD-10-CM

## 2013-03-12 DIAGNOSIS — B49 Unspecified mycosis: Secondary | ICD-10-CM

## 2013-03-12 DIAGNOSIS — E538 Deficiency of other specified B group vitamins: Secondary | ICD-10-CM

## 2013-03-12 DIAGNOSIS — D696 Thrombocytopenia, unspecified: Secondary | ICD-10-CM

## 2013-03-12 DIAGNOSIS — D649 Anemia, unspecified: Secondary | ICD-10-CM

## 2013-03-12 DIAGNOSIS — C9000 Multiple myeloma not having achieved remission: Secondary | ICD-10-CM

## 2013-03-12 DIAGNOSIS — D510 Vitamin B12 deficiency anemia due to intrinsic factor deficiency: Secondary | ICD-10-CM

## 2013-03-12 DIAGNOSIS — N182 Chronic kidney disease, stage 2 (mild): Secondary | ICD-10-CM

## 2013-03-12 MED ORDER — CLOTRIMAZOLE-BETAMETHASONE 1-0.05 % EX CREA: TOPICAL_CREAM | Freq: Two times a day (BID) | CUTANEOUS | Status: DC

## 2013-03-12 MED ORDER — LORAZEPAM 0.5 MG PO TABS: 0.5000 mg | ORAL_TABLET | ORAL | Status: DC | PRN

## 2013-03-12 NOTE — Telephone Encounter (Signed)
Both called in to [facility]

## 2013-03-13 ENCOUNTER — Telehealth (HOSPITAL_COMMUNITY): Payer: Self-pay | Admitting: *Deleted

## 2013-03-13 ENCOUNTER — Telehealth (HOSPITAL_COMMUNITY): Payer: Self-pay

## 2013-03-13 ENCOUNTER — Other Ambulatory Visit (HOSPITAL_COMMUNITY): Payer: Self-pay | Admitting: Oncology

## 2013-03-13 DIAGNOSIS — D649 Anemia, unspecified: Secondary | ICD-10-CM

## 2013-03-13 DIAGNOSIS — K219 Gastro-esophageal reflux disease without esophagitis: Secondary | ICD-10-CM

## 2013-03-13 DIAGNOSIS — D510 Vitamin B12 deficiency anemia due to intrinsic factor deficiency: Secondary | ICD-10-CM

## 2013-03-13 DIAGNOSIS — N289 Disorder of kidney and ureter, unspecified: Secondary | ICD-10-CM

## 2013-03-13 DIAGNOSIS — E669 Obesity, unspecified: Secondary | ICD-10-CM

## 2013-03-13 DIAGNOSIS — E538 Deficiency of other specified B group vitamins: Secondary | ICD-10-CM

## 2013-03-13 DIAGNOSIS — N182 Chronic kidney disease, stage 2 (mild): Secondary | ICD-10-CM

## 2013-03-13 DIAGNOSIS — D696 Thrombocytopenia, unspecified: Secondary | ICD-10-CM

## 2013-03-13 DIAGNOSIS — C9000 Multiple myeloma not having achieved remission: Secondary | ICD-10-CM

## 2013-03-13 MED ORDER — LIDOCAINE-PRILOCAINE 2.5-2.5 % EX CREA: TOPICAL_CREAM | CUTANEOUS | Status: AC | PRN

## 2013-03-13 MED ORDER — HYDROCODONE-ACETAMINOPHEN 5-325 MG PO TABS: 1.0000 | ORAL_TABLET | ORAL | Status: DC | PRN

## 2013-03-13 NOTE — Telephone Encounter (Signed)
Spoke with pt as below. She will need a [name] for the hydrocodone because she only has a few left. She will call us on Tue when we re-open after holiday to let us know how her pain is.

## 2013-03-13 NOTE — Telephone Encounter (Signed)
Message copied by Evelena Leyden on Fri Mar 13, 2013 10:56 AM ------      Message from: Ellouise Newer      Created: Fri Mar 13, 2013 10:13 AM       She may increase her Hydrocodone to 1-2 tablets every 4 hours, maximum of 10 in 24 hours. Do this over the weekend and call Monday to let us know how she is doing with pain. Let me knwo if I need to call in a new Rx for the weekend. May need to increase her Oxycontin in the future. ------

## 2013-03-13 NOTE — Telephone Encounter (Signed)
Message copied by Dennie Maizes on Fri Mar 13, 2013 11:56 AM ------      Message from: Ellouise Newer      Created: Fri Mar 13, 2013 10:13 AM       She may increase her Hydrocodone to 1-2 tablets every 4 hours, maximum of 10 in 24 hours. Do this over the weekend and call Monday to let us know how she is doing with pain. Let me knwo if I need to call in a [facility] Rx for the weekend. May need to increase her Oxycontin in the future. ------

## 2013-03-13 NOTE — Telephone Encounter (Signed)
Spoke with [name] and instructions given.  Verbalized understanding of instructions.  Per [name] she will need refill for Hydrocodone and her Emla cream

## 2013-03-17 ENCOUNTER — Ambulatory Visit (HOSPITAL_COMMUNITY): Payer: Medicare Other

## 2013-03-19 ENCOUNTER — Telehealth (HOSPITAL_COMMUNITY): Payer: Self-pay | Admitting: *Deleted

## 2013-03-19 ENCOUNTER — Encounter (HOSPITAL_BASED_OUTPATIENT_CLINIC_OR_DEPARTMENT_OTHER): Payer: Medicare Other

## 2013-03-19 VITALS — BP 127/79 | HR 70 | Temp 98.2°F | Resp 16

## 2013-03-19 DIAGNOSIS — N179 Acute kidney failure, unspecified: Secondary | ICD-10-CM

## 2013-03-19 DIAGNOSIS — D510 Vitamin B12 deficiency anemia due to intrinsic factor deficiency: Secondary | ICD-10-CM

## 2013-03-19 DIAGNOSIS — D630 Anemia in neoplastic disease: Secondary | ICD-10-CM

## 2013-03-19 DIAGNOSIS — E669 Obesity, unspecified: Secondary | ICD-10-CM

## 2013-03-19 DIAGNOSIS — D649 Anemia, unspecified: Secondary | ICD-10-CM

## 2013-03-19 DIAGNOSIS — E538 Deficiency of other specified B group vitamins: Secondary | ICD-10-CM

## 2013-03-19 DIAGNOSIS — S32599S Other specified fracture of unspecified pubis, sequela: Secondary | ICD-10-CM

## 2013-03-19 DIAGNOSIS — C9000 Multiple myeloma not having achieved remission: Secondary | ICD-10-CM

## 2013-03-19 DIAGNOSIS — N182 Chronic kidney disease, stage 2 (mild): Secondary | ICD-10-CM

## 2013-03-19 DIAGNOSIS — D638 Anemia in other chronic diseases classified elsewhere: Secondary | ICD-10-CM

## 2013-03-19 LAB — BASIC METABOLIC PANEL
CO2: 22 mEq/L (ref 19–32)
GFR calc non Af Amer: 24 mL/min — ABNORMAL LOW (ref >90)
Glucose, Bld: 93 mg/dL (ref 70–99)
Potassium: 4.5 mEq/L (ref 3.5–5.1)
Sodium: 136 mEq/L (ref 135–145)

## 2013-03-19 LAB — CBC WITH DIFFERENTIAL/PLATELET
Eosinophils Absolute: 0.1 10*3/uL (ref 0.0–0.7)
Lymphocytes Relative: 14 % (ref 12–46)
Lymphs Abs: 0.7 10*3/uL (ref 0.7–4.0)
Neutrophils Relative %: 73 % (ref 43–77)
Platelets: 113 10*3/uL — ABNORMAL LOW (ref 150–400)
RBC: 2.96 MIL/uL — ABNORMAL LOW (ref 3.87–5.11)
WBC: 5 10*3/uL (ref 4.0–10.5)

## 2013-03-19 MED ORDER — DARBEPOETIN ALFA-POLYSORBATE 500 MCG/ML IJ SOLN
INTRAMUSCULAR | Status: AC
  Filled 2013-03-19: qty 1

## 2013-03-19 MED ORDER — ZOLEDRONIC ACID 4 MG/5ML IV CONC: 4.0000 mg | Freq: Once | INTRAVENOUS | Status: DC

## 2013-03-19 MED ORDER — DARBEPOETIN ALFA-POLYSORBATE 500 MCG/ML IJ SOLN
500.0000 ug | Freq: Once | INTRAMUSCULAR | Status: AC
  Administered 2013-03-19: 500 ug via SUBCUTANEOUS

## 2013-03-19 MED ORDER — HEPARIN SOD (PORK) LOCK FLUSH 100 UNIT/ML IV SOLN
500.0000 [IU] | Freq: Once | INTRAVENOUS | Status: AC | PRN
  Administered 2013-03-19: 500 [IU]
  Filled 2013-03-19: qty 5

## 2013-03-19 MED ORDER — CYANOCOBALAMIN 1000 MCG/ML IJ SOLN
1000.0000 ug | Freq: Once | INTRAMUSCULAR | Status: AC
  Administered 2013-03-19: 1000 ug via INTRAMUSCULAR

## 2013-03-19 MED ORDER — ZOLEDRONIC ACID 4 MG/5ML IV CONC
3.0000 mg | Freq: Once | INTRAVENOUS | Status: AC
  Administered 2013-03-19: 3 mg via INTRAVENOUS
  Filled 2013-03-19: qty 3.75

## 2013-03-19 MED ORDER — CYANOCOBALAMIN 1000 MCG/ML IJ SOLN
INTRAMUSCULAR | Status: AC
  Filled 2013-03-19: qty 1

## 2013-03-19 MED ORDER — SODIUM CHLORIDE 0.9 % IV SOLN
Freq: Once | INTRAVENOUS | Status: AC
  Administered 2013-03-19: 11:00:00 via INTRAVENOUS

## 2013-03-19 MED ORDER — SODIUM CHLORIDE 0.9 % IJ SOLN
10.0000 mL | INTRAMUSCULAR | Status: DC | PRN
  Administered 2013-03-19: 10 mL
  Filled 2013-03-19: qty 10

## 2013-03-19 MED ORDER — HEPARIN SOD (PORK) LOCK FLUSH 100 UNIT/ML IV SOLN
INTRAVENOUS | Status: AC
  Filled 2013-03-19: qty 5

## 2013-03-19 NOTE — Telephone Encounter (Signed)
Talked to Dorene Sorrow and let him know the options for pain med changes and he will talk to Johari to see what she wants to do.

## 2013-03-20 ENCOUNTER — Telehealth (HOSPITAL_COMMUNITY): Payer: Self-pay | Admitting: Oncology

## 2013-03-20 ENCOUNTER — Other Ambulatory Visit (HOSPITAL_COMMUNITY): Payer: Self-pay | Admitting: Oncology

## 2013-03-20 DIAGNOSIS — D649 Anemia, unspecified: Secondary | ICD-10-CM

## 2013-03-20 DIAGNOSIS — E538 Deficiency of other specified B group vitamins: Secondary | ICD-10-CM

## 2013-03-20 DIAGNOSIS — D696 Thrombocytopenia, unspecified: Secondary | ICD-10-CM

## 2013-03-20 DIAGNOSIS — K219 Gastro-esophageal reflux disease without esophagitis: Secondary | ICD-10-CM

## 2013-03-20 DIAGNOSIS — N182 Chronic kidney disease, stage 2 (mild): Secondary | ICD-10-CM

## 2013-03-20 DIAGNOSIS — N289 Disorder of kidney and ureter, unspecified: Secondary | ICD-10-CM

## 2013-03-20 DIAGNOSIS — C9000 Multiple myeloma not having achieved remission: Secondary | ICD-10-CM

## 2013-03-20 DIAGNOSIS — D510 Vitamin B12 deficiency anemia due to intrinsic factor deficiency: Secondary | ICD-10-CM

## 2013-03-20 DIAGNOSIS — E669 Obesity, unspecified: Secondary | ICD-10-CM

## 2013-03-20 NOTE — Telephone Encounter (Signed)
Please verify with patient that this is what she wants to do (increase dose of Hydrocodone; or did she mean Oxycontin?).  If she wants to increase Hydrocodone dose, please call in an Rx to [facility] for Hydrocodone 10/325 mg take 1-2 tablets PO every 4-6 hours PRN pain #50 with 0 refills. Send the order to me for a cosign.  If she wants to increase Oxycontin instead, increase to 4 times per day (compared to TID).  Let me know and I will write a new Rx.  Either choice is reasonable.

## 2013-03-20 NOTE — Telephone Encounter (Signed)
Per son, patient wants to increase dose of Hydrocodone. Instructed that Dellis Anes, PA has authorized increase of Hyrocodone to 10/325  1 - 2 pills every 4 - 6 hours as needed for pain and that prescription will be called into [place]. Dorene Sorrow will let his mother know.

## 2013-03-23 ENCOUNTER — Other Ambulatory Visit (HOSPITAL_COMMUNITY): Payer: Self-pay | Admitting: Oncology

## 2013-03-23 DIAGNOSIS — E669 Obesity, unspecified: Secondary | ICD-10-CM

## 2013-03-23 DIAGNOSIS — N182 Chronic kidney disease, stage 2 (mild): Secondary | ICD-10-CM

## 2013-03-23 DIAGNOSIS — C9000 Multiple myeloma not having achieved remission: Secondary | ICD-10-CM

## 2013-03-23 DIAGNOSIS — D510 Vitamin B12 deficiency anemia due to intrinsic factor deficiency: Secondary | ICD-10-CM

## 2013-03-23 DIAGNOSIS — N289 Disorder of kidney and ureter, unspecified: Secondary | ICD-10-CM

## 2013-03-23 DIAGNOSIS — D649 Anemia, unspecified: Secondary | ICD-10-CM

## 2013-03-23 DIAGNOSIS — K219 Gastro-esophageal reflux disease without esophagitis: Secondary | ICD-10-CM

## 2013-03-23 DIAGNOSIS — E538 Deficiency of other specified B group vitamins: Secondary | ICD-10-CM

## 2013-03-23 DIAGNOSIS — D696 Thrombocytopenia, unspecified: Secondary | ICD-10-CM

## 2013-03-23 MED ORDER — HYDROCODONE-ACETAMINOPHEN 10-325 MG PO TABS: 1.0000 | ORAL_TABLET | Freq: Four times a day (QID) | ORAL | Status: DC | PRN

## 2013-03-23 NOTE — Telephone Encounter (Signed)
New Hydrocodone Rx called in to [facility]

## 2013-03-27 ENCOUNTER — Telehealth (HOSPITAL_COMMUNITY): Payer: Self-pay | Admitting: Oncology

## 2013-03-27 ENCOUNTER — Other Ambulatory Visit (HOSPITAL_COMMUNITY): Payer: Self-pay | Admitting: Oncology

## 2013-03-27 DIAGNOSIS — G8929 Other chronic pain: Secondary | ICD-10-CM

## 2013-03-27 DIAGNOSIS — C9000 Multiple myeloma not having achieved remission: Secondary | ICD-10-CM

## 2013-03-27 MED ORDER — OXYCODONE HCL 10 MG PO TB12: 10.0000 mg | ORAL_TABLET | Freq: Three times a day (TID) | ORAL | Status: DC

## 2013-03-27 NOTE — Telephone Encounter (Signed)
MAILED OXYCONTIN RX TO [facility]  April Manson [facility] [phone]

## 2013-03-30 ENCOUNTER — Telehealth (HOSPITAL_COMMUNITY): Payer: Self-pay | Admitting: Oncology

## 2013-03-30 NOTE — Telephone Encounter (Signed)
Rx was not picked up by mailroom until today 03/30/13  [name] [phone]

## 2013-04-13 ENCOUNTER — Other Ambulatory Visit (HOSPITAL_COMMUNITY): Payer: Self-pay | Admitting: Oncology

## 2013-04-13 DIAGNOSIS — C9000 Multiple myeloma not having achieved remission: Secondary | ICD-10-CM

## 2013-04-13 MED ORDER — SPIRONOLACTONE 50 MG PO TABS: 50.0000 mg | ORAL_TABLET | Freq: Every day | ORAL | Status: DC

## 2013-04-16 ENCOUNTER — Encounter (HOSPITAL_COMMUNITY): Payer: Medicare Other | Attending: Oncology

## 2013-04-16 VITALS — BP 138/71 | HR 71 | Temp 98.3°F | Resp 16

## 2013-04-16 DIAGNOSIS — N182 Chronic kidney disease, stage 2 (mild): Secondary | ICD-10-CM | POA: Insufficient documentation

## 2013-04-16 DIAGNOSIS — N179 Acute kidney failure, unspecified: Secondary | ICD-10-CM

## 2013-04-16 DIAGNOSIS — E669 Obesity, unspecified: Secondary | ICD-10-CM

## 2013-04-16 DIAGNOSIS — D649 Anemia, unspecified: Secondary | ICD-10-CM | POA: Insufficient documentation

## 2013-04-16 DIAGNOSIS — C9000 Multiple myeloma not having achieved remission: Secondary | ICD-10-CM

## 2013-04-16 DIAGNOSIS — N289 Disorder of kidney and ureter, unspecified: Secondary | ICD-10-CM

## 2013-04-16 DIAGNOSIS — D630 Anemia in neoplastic disease: Secondary | ICD-10-CM

## 2013-04-16 DIAGNOSIS — S32599S Other specified fracture of unspecified pubis, sequela: Secondary | ICD-10-CM

## 2013-04-16 DIAGNOSIS — K219 Gastro-esophageal reflux disease without esophagitis: Secondary | ICD-10-CM

## 2013-04-16 DIAGNOSIS — D638 Anemia in other chronic diseases classified elsewhere: Secondary | ICD-10-CM

## 2013-04-16 DIAGNOSIS — M810 Age-related osteoporosis without current pathological fracture: Secondary | ICD-10-CM

## 2013-04-16 DIAGNOSIS — D510 Vitamin B12 deficiency anemia due to intrinsic factor deficiency: Secondary | ICD-10-CM

## 2013-04-16 DIAGNOSIS — D696 Thrombocytopenia, unspecified: Secondary | ICD-10-CM

## 2013-04-16 DIAGNOSIS — N189 Chronic kidney disease, unspecified: Secondary | ICD-10-CM

## 2013-04-16 DIAGNOSIS — E538 Deficiency of other specified B group vitamins: Secondary | ICD-10-CM

## 2013-04-16 DIAGNOSIS — N039 Chronic nephritic syndrome with unspecified morphologic changes: Secondary | ICD-10-CM

## 2013-04-16 LAB — CBC WITH DIFFERENTIAL/PLATELET
Basophils Absolute: 0 10*3/uL (ref 0.0–0.1)
Basophils Relative: 0 % (ref 0–1)
Eosinophils Relative: 2 % (ref 0–5)
Lymphocytes Relative: 14 % (ref 12–46)
MCHC: 32.6 g/dL (ref 30.0–36.0)
Monocytes Absolute: 0.5 10*3/uL (ref 0.1–1.0)
Neutro Abs: 4 10*3/uL (ref 1.7–7.7)
Platelets: 120 10*3/uL — ABNORMAL LOW (ref 150–400)
RDW: 14.6 % (ref 11.5–15.5)
WBC: 5.4 10*3/uL (ref 4.0–10.5)

## 2013-04-16 LAB — BASIC METABOLIC PANEL
CO2: 24 mEq/L (ref 19–32)
Calcium: 9.3 mg/dL (ref 8.4–10.5)
Chloride: 103 mEq/L (ref 96–112)
Creatinine, Ser: 1.46 mg/dL — ABNORMAL HIGH (ref 0.50–1.10)
GFR calc Af Amer: 39 mL/min — ABNORMAL LOW (ref >90)
Sodium: 138 mEq/L (ref 135–145)

## 2013-04-16 MED ORDER — HEPARIN SOD (PORK) LOCK FLUSH 100 UNIT/ML IV SOLN
500.0000 [IU] | Freq: Once | INTRAVENOUS | Status: AC
  Administered 2013-04-16: 500 [IU] via INTRAVENOUS
  Filled 2013-04-16: qty 5

## 2013-04-16 MED ORDER — SODIUM CHLORIDE 0.9 % IV SOLN
Freq: Once | INTRAVENOUS | Status: AC
  Administered 2013-04-16: 11:00:00 via INTRAVENOUS

## 2013-04-16 MED ORDER — DARBEPOETIN ALFA-POLYSORBATE 500 MCG/ML IJ SOLN
INTRAMUSCULAR | Status: AC
  Filled 2013-04-16: qty 1

## 2013-04-16 MED ORDER — CYANOCOBALAMIN 1000 MCG/ML IJ SOLN
1000.0000 ug | Freq: Once | INTRAMUSCULAR | Status: AC
  Administered 2013-04-16: 1000 ug via INTRAMUSCULAR

## 2013-04-16 MED ORDER — HEPARIN SOD (PORK) LOCK FLUSH 100 UNIT/ML IV SOLN
INTRAVENOUS | Status: AC
  Filled 2013-04-16: qty 5

## 2013-04-16 MED ORDER — CYANOCOBALAMIN 1000 MCG/ML IJ SOLN
INTRAMUSCULAR | Status: AC
  Filled 2013-04-16: qty 1

## 2013-04-16 MED ORDER — SODIUM CHLORIDE 0.9 % IV SOLN
3.3000 mg | Freq: Once | INTRAVENOUS | Status: AC
  Administered 2013-04-16: 3.3 mg via INTRAVENOUS
  Filled 2013-04-16: qty 4.13

## 2013-04-16 MED ORDER — DARBEPOETIN ALFA-POLYSORBATE 500 MCG/ML IJ SOLN
500.0000 ug | Freq: Once | INTRAMUSCULAR | Status: AC
  Administered 2013-04-16: 500 ug via SUBCUTANEOUS

## 2013-04-17 LAB — KAPPA/LAMBDA LIGHT CHAINS
Kappa free light chain: 2.3 mg/dL — ABNORMAL HIGH (ref 0.33–1.94)
Kappa, lambda light chain ratio: 0.04 — ABNORMAL LOW (ref 0.26–1.65)

## 2013-04-20 LAB — MULTIPLE MYELOMA PANEL, SERUM
Albumin ELP: 61.1 % (ref 55.8–66.1)
Alpha-1-Globulin: 4.8 % (ref 2.9–4.9)
Alpha-2-Globulin: 12.2 % — ABNORMAL HIGH (ref 7.1–11.8)
Beta 2: 4.6 % (ref 3.2–6.5)
Beta Globulin: 5.9 % (ref 4.7–7.2)
Gamma Globulin: 11.4 % (ref 11.1–18.8)
IgM, Serum: 44 mg/dL — ABNORMAL LOW (ref 52–322)

## 2013-04-27 ENCOUNTER — Telehealth (HOSPITAL_COMMUNITY): Payer: Self-pay | Admitting: Oncology

## 2013-04-27 ENCOUNTER — Other Ambulatory Visit (HOSPITAL_COMMUNITY): Payer: Self-pay | Admitting: Oncology

## 2013-04-27 DIAGNOSIS — M545 Low back pain: Secondary | ICD-10-CM

## 2013-04-27 DIAGNOSIS — C9000 Multiple myeloma not having achieved remission: Secondary | ICD-10-CM

## 2013-04-27 DIAGNOSIS — G8929 Other chronic pain: Secondary | ICD-10-CM

## 2013-04-27 MED ORDER — OXYCODONE HCL 10 MG PO TB12: 10.0000 mg | ORAL_TABLET | Freq: Three times a day (TID) | ORAL | Status: DC

## 2013-04-27 NOTE — Telephone Encounter (Signed)
Rx printed and placed on [facility] chair.

## 2013-04-27 NOTE — Telephone Encounter (Signed)
Mailed oxycontin rx to [facility]

## 2013-04-29 ENCOUNTER — Other Ambulatory Visit (HOSPITAL_COMMUNITY): Payer: Self-pay | Admitting: Oncology

## 2013-04-29 ENCOUNTER — Telehealth (HOSPITAL_COMMUNITY): Payer: Self-pay | Admitting: *Deleted

## 2013-04-29 DIAGNOSIS — C9000 Multiple myeloma not having achieved remission: Secondary | ICD-10-CM

## 2013-04-29 MED ORDER — POTASSIUM CHLORIDE CRYS ER 20 MEQ PO TBCR: 20.0000 meq | EXTENDED_RELEASE_TABLET | Freq: Every day | ORAL | Status: DC

## 2013-04-29 NOTE — Telephone Encounter (Signed)
[facility] called and needed refill on her Potassium, K-Dur 20 MEQ.

## 2013-04-30 ENCOUNTER — Ambulatory Visit (HOSPITAL_COMMUNITY): Payer: Medicare Other

## 2013-05-01 ENCOUNTER — Other Ambulatory Visit (HOSPITAL_COMMUNITY): Payer: Self-pay | Admitting: Oncology

## 2013-05-01 DIAGNOSIS — C9000 Multiple myeloma not having achieved remission: Secondary | ICD-10-CM

## 2013-05-01 MED ORDER — ALPRAZOLAM 0.5 MG PO TABS: 0.5000 mg | ORAL_TABLET | Freq: Three times a day (TID) | ORAL | Status: DC | PRN

## 2013-05-13 ENCOUNTER — Other Ambulatory Visit (HOSPITAL_COMMUNITY): Payer: Self-pay | Admitting: Oncology

## 2013-05-13 DIAGNOSIS — C9000 Multiple myeloma not having achieved remission: Secondary | ICD-10-CM

## 2013-05-13 MED ORDER — HYDROCODONE-ACETAMINOPHEN 10-325 MG PO TABS: 1.0000 | ORAL_TABLET | Freq: Four times a day (QID) | ORAL | Status: DC | PRN

## 2013-05-14 ENCOUNTER — Encounter (HOSPITAL_COMMUNITY): Payer: Medicare Other | Attending: Oncology

## 2013-05-14 DIAGNOSIS — N182 Chronic kidney disease, stage 2 (mild): Secondary | ICD-10-CM | POA: Insufficient documentation

## 2013-05-14 DIAGNOSIS — S32599S Other specified fracture of unspecified pubis, sequela: Secondary | ICD-10-CM

## 2013-05-14 DIAGNOSIS — C9000 Multiple myeloma not having achieved remission: Secondary | ICD-10-CM | POA: Insufficient documentation

## 2013-05-14 DIAGNOSIS — N289 Disorder of kidney and ureter, unspecified: Secondary | ICD-10-CM

## 2013-05-14 DIAGNOSIS — D638 Anemia in other chronic diseases classified elsewhere: Secondary | ICD-10-CM

## 2013-05-14 DIAGNOSIS — N179 Acute kidney failure, unspecified: Secondary | ICD-10-CM

## 2013-05-14 DIAGNOSIS — D696 Thrombocytopenia, unspecified: Secondary | ICD-10-CM

## 2013-05-14 DIAGNOSIS — D649 Anemia, unspecified: Secondary | ICD-10-CM | POA: Insufficient documentation

## 2013-05-14 DIAGNOSIS — K219 Gastro-esophageal reflux disease without esophagitis: Secondary | ICD-10-CM | POA: Insufficient documentation

## 2013-05-14 DIAGNOSIS — E669 Obesity, unspecified: Secondary | ICD-10-CM

## 2013-05-14 DIAGNOSIS — D631 Anemia in chronic kidney disease: Secondary | ICD-10-CM

## 2013-05-14 DIAGNOSIS — E538 Deficiency of other specified B group vitamins: Secondary | ICD-10-CM | POA: Insufficient documentation

## 2013-05-14 DIAGNOSIS — D510 Vitamin B12 deficiency anemia due to intrinsic factor deficiency: Secondary | ICD-10-CM

## 2013-05-14 LAB — BASIC METABOLIC PANEL
Calcium: 9.5 mg/dL (ref 8.4–10.5)
GFR calc Af Amer: 30 mL/min — ABNORMAL LOW (ref >90)
GFR calc non Af Amer: 25 mL/min — ABNORMAL LOW (ref >90)
Potassium: 4.2 mEq/L (ref 3.5–5.1)
Sodium: 137 mEq/L (ref 135–145)

## 2013-05-14 LAB — CBC WITH DIFFERENTIAL/PLATELET
Hemoglobin: 10.3 g/dL — ABNORMAL LOW (ref 12.0–15.0)
Lymphs Abs: 0.7 10*3/uL (ref 0.7–4.0)
Monocytes Relative: 9 % (ref 3–12)
Neutro Abs: 4.4 10*3/uL (ref 1.7–7.7)
Neutrophils Relative %: 78 % — ABNORMAL HIGH (ref 43–77)
Platelets: 113 10*3/uL — ABNORMAL LOW (ref 150–400)
RBC: 3.22 MIL/uL — ABNORMAL LOW (ref 3.87–5.11)
WBC: 5.7 10*3/uL (ref 4.0–10.5)

## 2013-05-14 MED ORDER — ALTEPLASE 2 MG IJ SOLR
2.0000 mg | Freq: Once | INTRAMUSCULAR | Status: DC | PRN
  Filled 2013-05-14: qty 2

## 2013-05-14 MED ORDER — CYANOCOBALAMIN 1000 MCG/ML IJ SOLN
INTRAMUSCULAR | Status: AC
  Filled 2013-05-14: qty 1

## 2013-05-14 MED ORDER — DARBEPOETIN ALFA-POLYSORBATE 500 MCG/ML IJ SOLN
INTRAMUSCULAR | Status: AC
  Filled 2013-05-14: qty 1

## 2013-05-14 MED ORDER — DARBEPOETIN ALFA-POLYSORBATE 500 MCG/ML IJ SOLN
500.0000 ug | Freq: Once | INTRAMUSCULAR | Status: AC
  Administered 2013-05-14: 500 ug via SUBCUTANEOUS

## 2013-05-14 MED ORDER — HEPARIN SOD (PORK) LOCK FLUSH 100 UNIT/ML IV SOLN
INTRAVENOUS | Status: AC
  Filled 2013-05-14: qty 5

## 2013-05-14 MED ORDER — SODIUM CHLORIDE 0.9 % IJ SOLN
10.0000 mL | INTRAMUSCULAR | Status: DC | PRN
  Administered 2013-05-14: 10 mL
  Filled 2013-05-14: qty 10

## 2013-05-14 MED ORDER — HEPARIN SOD (PORK) LOCK FLUSH 100 UNIT/ML IV SOLN
250.0000 [IU] | Freq: Once | INTRAVENOUS | Status: DC | PRN
  Filled 2013-05-14: qty 5

## 2013-05-14 MED ORDER — HEPARIN SOD (PORK) LOCK FLUSH 100 UNIT/ML IV SOLN
500.0000 [IU] | Freq: Once | INTRAVENOUS | Status: AC | PRN
  Administered 2013-05-14: 500 [IU]
  Filled 2013-05-14: qty 5

## 2013-05-14 MED ORDER — CYANOCOBALAMIN 1000 MCG/ML IJ SOLN
1000.0000 ug | Freq: Once | INTRAMUSCULAR | Status: AC
  Administered 2013-05-14: 1000 ug via INTRAMUSCULAR

## 2013-05-14 MED ORDER — ZOLEDRONIC ACID 4 MG/5ML IV CONC
4.0000 mg | Freq: Once | INTRAVENOUS | Status: AC
  Administered 2013-05-14: 4 mg via INTRAVENOUS
  Filled 2013-05-14: qty 5

## 2013-05-14 MED ORDER — SODIUM CHLORIDE 0.9 % IV SOLN
Freq: Once | INTRAVENOUS | Status: AC
  Administered 2013-05-14: 12:00:00 via INTRAVENOUS

## 2013-05-14 NOTE — Progress Notes (Signed)
[name] presents today for injection per MD orders. B12 1000 mcg administered IM in left Upper Arm. Administration without incident. Patient tolerated well.

## 2013-05-14 NOTE — Progress Notes (Signed)
[name] presents today for injection per MD orders. Aranesp 500 mcg administered SQ in left Abdomen. Administration without incident. Patient tolerated well.

## 2013-05-15 LAB — KAPPA/LAMBDA LIGHT CHAINS
Kappa free light chain: 2.31 mg/dL — ABNORMAL HIGH (ref 0.33–1.94)
Kappa, lambda light chain ratio: 0.05 — ABNORMAL LOW (ref 0.26–1.65)
Lambda free light chains: 45.6 mg/dL — ABNORMAL HIGH (ref 0.57–2.63)

## 2013-05-18 LAB — MULTIPLE MYELOMA PANEL, SERUM
Alpha-2-Globulin: 13.5 % — ABNORMAL HIGH (ref 7.1–11.8)
Beta 2: 5.2 % (ref 3.2–6.5)
Beta Globulin: 5.7 % (ref 4.7–7.2)
Gamma Globulin: 12.5 % (ref 11.1–18.8)
IgG (Immunoglobin G), Serum: 524 mg/dL — ABNORMAL LOW (ref 690–1700)
M-Spike, %: 0.34 g/dL

## 2013-05-26 ENCOUNTER — Encounter (HOSPITAL_COMMUNITY): Payer: Medicare Other | Attending: Oncology

## 2013-05-26 VITALS — BP 120/69 | HR 80 | Temp 98.2°F | Resp 16

## 2013-05-26 DIAGNOSIS — R609 Edema, unspecified: Secondary | ICD-10-CM

## 2013-05-26 DIAGNOSIS — N182 Chronic kidney disease, stage 2 (mild): Secondary | ICD-10-CM | POA: Insufficient documentation

## 2013-05-26 DIAGNOSIS — G8929 Other chronic pain: Secondary | ICD-10-CM

## 2013-05-26 DIAGNOSIS — C9000 Multiple myeloma not having achieved remission: Secondary | ICD-10-CM

## 2013-05-26 DIAGNOSIS — D638 Anemia in other chronic diseases classified elsewhere: Secondary | ICD-10-CM | POA: Insufficient documentation

## 2013-05-26 DIAGNOSIS — M549 Dorsalgia, unspecified: Secondary | ICD-10-CM | POA: Insufficient documentation

## 2013-05-26 DIAGNOSIS — I129 Hypertensive chronic kidney disease with stage 1 through stage 4 chronic kidney disease, or unspecified chronic kidney disease: Secondary | ICD-10-CM | POA: Insufficient documentation

## 2013-05-26 DIAGNOSIS — D649 Anemia, unspecified: Secondary | ICD-10-CM

## 2013-05-26 DIAGNOSIS — R5381 Other malaise: Secondary | ICD-10-CM | POA: Insufficient documentation

## 2013-05-26 DIAGNOSIS — M25519 Pain in unspecified shoulder: Secondary | ICD-10-CM | POA: Insufficient documentation

## 2013-05-26 DIAGNOSIS — K219 Gastro-esophageal reflux disease without esophagitis: Secondary | ICD-10-CM | POA: Insufficient documentation

## 2013-05-26 DIAGNOSIS — M810 Age-related osteoporosis without current pathological fracture: Secondary | ICD-10-CM | POA: Insufficient documentation

## 2013-05-26 NOTE — Progress Notes (Signed)
Chief complaint: Multiple myeloma.  History of present illness:  Patient returns to clinic today for routine laboratory work and further evaluation. She continues complaining of increased weakness and fatigue, but otherwise feels well. She also complains of back and right shoulder pain that is chronic in nature. She otherwise feels well.  On she has no neurologic complaints. She denies any recent fevers or illnesses. She has no chest pain or shortness of breath. She denies any nausea, vomiting, constipation, or diarrhea. She has no urinary complaints. Patient otherwise feels well and offers no further specific complaints.  Active Ambulatory Problems    Diagnosis Date Noted  . Essential hypertension, benign 04/29/2009  . EDEMA 04/29/2009  . Obesity   . Osteoporosis   . Multiple myeloma   . Gastroesophageal reflux disease   . Pernicious anemia   . Chronic kidney disease, stage 2, mildly decreased GFR   . Peripheral neuropathy   . Chronic low back pain   . Chronic steroid use   . Anemia of chronic disease 04/14/2011  . B12 deficiency 04/14/2011  . Secondary cardiomyopathy, unspecified 01/23/2012  . Acute bronchitis 02/29/2012  . Physical deconditioning 03/08/2012  . Fracture Of Multiple Pubic Rami 03/08/2012  . Hypokalemia 03/09/2012  . Hyperglycemia, drug-induced 03/09/2012  . Thrombocytopenia 03/10/2012  . Bradycardia 03/10/2012  . Candidal intertrigo 03/11/2012  . Facial contusion 03/11/2012  . Possible Pneumonia, organism unspecified 04/17/2012  . Bronchospasm 04/17/2012  . Hyponatremia 04/17/2012  . Anemia 04/17/2012  . Fever 04/17/2012  . Emphysema 04/18/2012  . Fall 05/29/2012  . UTI (urinary tract infection) 05/29/2012  . Bilateral pubic rami fractures 05/29/2012  . Weakness generalized 05/29/2012  . Medication side effect 05/29/2012  . Acute renal failure 05/29/2012   Resolved Ambulatory Problems    Diagnosis Date Noted  . Renal insufficiency 04/17/2012  . CKD  (chronic kidney disease), stage III 05/29/2012   Past Medical History  Diagnosis Date  . Multiple myeloma(203.0) 2007  . History of candidiasis   . Allergy   . Anemia of chronic disease    Current outpatient prescriptions:albuterol (PROVENTIL) (2.5 MG/3ML) 0.083% nebulizer solution, Take 2.5 mg by nebulization every 6 (six) hours as needed. For shortness of breath/wheezing, Disp: , Rfl: ;  ALPRAZolam (XANAX) 0.5 MG tablet, Take 1 tablet (0.5 mg total) by mouth 3 (three) times daily as needed for sleep or anxiety., Disp: 90 tablet, Rfl: 1;  amLODipine (NORVASC) 5 MG tablet, TAKE ONE TABLET BY MOUTH DAILY, Disp: 90 tablet, Rfl: 0 calcium carbonate (OS-CAL) 600 MG TABS, Take 600 mg by mouth 2 (two) times daily with a meal., Disp: , Rfl: ;  cholecalciferol (VITAMIN D) 1000 UNITS tablet, Take 1,000 Units by mouth daily., Disp: , Rfl: ;  clotrimazole-betamethasone (LOTRISONE) cream, Apply topically 2 (two) times daily., Disp: 30 g, Rfl: 0 diazepam (VALIUM) 5 MG tablet, Take 1 tablet (5 mg total) by mouth at bedtime as needed for anxiety or sleep. For anxiety, Disp: 30 tablet, Rfl: 0;  folic acid (FOLVITE) 1 MG tablet, Take 1 tablet (1 mg total) by mouth daily., Disp: 90 tablet, Rfl: 1;  furosemide (LASIX) 20 MG tablet, Take 1 tablet (20 mg total) by mouth daily., Disp: 30 tablet, Rfl: 1 HYDROcodone-acetaminophen (NORCO) 10-325 MG per tablet, Take 1-2 tablets by mouth every 6 (six) hours as needed for pain., Disp: 60 tablet, Rfl: 2;  iron polysaccharides (NU-IRON) 150 MG capsule, Take 150 mg by mouth every Monday, Wednesday, and Friday. , Disp: , Rfl: ;  lidocaine-prilocaine (EMLA)  cream, Apply topically as needed., Disp: 30 g, Rfl: 1 LORazepam (ATIVAN) 0.5 MG tablet, Take 1 tablet (0.5 mg total) by mouth every 4 (four) hours as needed for anxiety., Disp: 30 tablet, Rfl: 1;  omeprazole (PRILOSEC) 20 MG capsule, Take 1 capsule (20 mg total) by mouth daily., Disp: 90 capsule, Rfl: 1;  oxyCODONE (OXYCONTIN) 10 MG  12 hr tablet, Take 1 tablet (10 mg total) by mouth every 8 (eight) hours. Gets this via [facility] - Prescription has to be mailed, Disp: 90 tablet, Rfl: 0 potassium chloride SA (K-DUR,KLOR-CON) 20 MEQ tablet, Take 1 tablet (20 mEq total) by mouth daily., Disp: 30 tablet, Rfl: 2;  senna (SENOKOT) 8.6 MG TABS, Take 1 tablet (8.6 mg total) by mouth 2 (two) times daily., Disp: 120 each, Rfl: ;  spironolactone (ALDACTONE) 50 MG tablet, Take 1 tablet (50 mg total) by mouth daily., Disp: 30 tablet, Rfl: 1  Review of systems: As per history of present illness, otherwise a 10 system review is negative.  Filed Vitals:   05/26/13 1103  BP: 120/69  Pulse: 80  Temp: 98.2 F (36.8 C)  Resp: 16    General:  Well-developed, well-nourished, no acute distress. Sitting in a wheelchair Mental status: Normal affect. Eyes: Anicteric sclera. Respiratory: Clear to auscultation bilaterally. Cardiovascular: Regular rate and rhythm, no rubs, murmurs, or gallops. Gastrointestinal: Soft, nontender, nondistended, normoactive bowel sounds. Musculoskeletal: No edema. Minimal range of motion of right shoulder. Skin: No rashes or petechiae noted. Neurological: Alert, answering all questions appropriately. Cranial nerves grossly intact.  CBC    Component Value Date/Time   WBC 5.7 05/14/2013 1032   RBC 3.22* 05/14/2013 1032   RBC 2.73* 10/02/2009 0425   HGB 10.3* 05/14/2013 1032   HCT 31.3* 05/14/2013 1032   PLT 113* 05/14/2013 1032   MCV 97.2 05/14/2013 1032   MCH 32.0 05/14/2013 1032   MCHC 32.9 05/14/2013 1032   RDW 14.5 05/14/2013 1032   LYMPHSABS 0.7 05/14/2013 1032   MONOABS 0.5 05/14/2013 1032   EOSABS 0.1 05/14/2013 1032   BASOSABS 0.0 05/14/2013 1032   BMET    Component Value Date/Time   NA 137 05/14/2013 1032   K 4.2 05/14/2013 1032   CL 102 05/14/2013 1032   CO2 25 05/14/2013 1032   GLUCOSE 136* 05/14/2013 1032   BUN 30* 05/14/2013 1032   CREATININE 1.83* 05/14/2013 1032   CREATININE 2.22* 10/22/2011  1000   CALCIUM 9.5 05/14/2013 1032   GFRNONAA 25* 05/14/2013 1032   GFRAA 30* 05/14/2013 1032    Assessment and plan:  1. Multiple myeloma: Patient initially presented with disease nearly 8 years ago.  Currently her light chains as well as M spike are essentially unchanged. Patient has previously expressed no interest in reinitiating treatments. Continue with monthly Zometa with her next infusion occurring nearly end of August. Patient understands he can return to clinic at any time if she has any questions, concerns, or complaints. 2. Shoulder and back pain: Continue current narcotic regimen as ordered. 3. Anemia: Patient's hemoglobin is essentially stable. It is greater than 10, therefore she does not require Procrit. 4. Lower extremity edema: Continue spironolactone.

## 2013-05-26 NOTE — Patient Instructions (Signed)
[facility] Discharge Instructions  RECOMMENDATIONS MADE BY THE CONSULTANT AND ANY TEST RESULTS WILL BE SENT TO YOUR REFERRING PHYSICIAN.  EXAM FINDINGS BY THE PHYSICIAN TODAY AND SIGNS OR SYMPTOMS TO REPORT TO CLINIC OR PRIMARY PHYSICIAN: Exam findings as discussed by Dr. Orlie Dakin.    SPECIAL INSTRUCTIONS/FOLLOW-UP: 1.  Please keep your 3 month follow-up appointment. 2.  Zometa as scheduled.  Thank you for choosing [name] to provide your oncology and hematology care.  To afford each patient quality time with our providers, please arrive at least 15 minutes before your scheduled appointment time.  With your help, our goal is to use those 15 minutes to complete the necessary work-up to ensure our physicians have the information they need to help with your evaluation and healthcare recommendations.    Effective January 1st, 2014, we ask that you re-schedule your appointment with our physicians should you arrive 10 or more minutes late for your appointment.  We strive to give you quality time with our providers, and arriving late affects you and other patients whose appointments are after yours.    Again, thank you for choosing [facility].  Our hope is that these requests will decrease the amount of time that you wait before being seen by our physicians.       _____________________________________________________________  Should you have questions after your visit to [facility], please contact our office at [phone] between the hours of 8:30 a.m. and 5:00 p.m.  Voicemails left after 4:30 p.m. will not be returned until the following business day.  For prescription refill requests, have your pharmacy contact our office with your prescription refill request.

## 2013-05-28 ENCOUNTER — Other Ambulatory Visit (HOSPITAL_COMMUNITY): Payer: Self-pay | Admitting: Oncology

## 2013-05-28 DIAGNOSIS — G8929 Other chronic pain: Secondary | ICD-10-CM

## 2013-05-28 DIAGNOSIS — C9000 Multiple myeloma not having achieved remission: Secondary | ICD-10-CM

## 2013-05-28 MED ORDER — OXYCODONE HCL 10 MG PO TB12: 10.0000 mg | ORAL_TABLET | Freq: Three times a day (TID) | ORAL | Status: DC

## 2013-06-01 ENCOUNTER — Telehealth (HOSPITAL_COMMUNITY): Payer: Self-pay | Admitting: *Deleted

## 2013-06-01 NOTE — Telephone Encounter (Signed)
Mailed rx to [facility] pharmacy 05/27/2010

## 2013-06-05 ENCOUNTER — Telehealth (HOSPITAL_COMMUNITY): Payer: Self-pay | Admitting: Oncology

## 2013-06-05 ENCOUNTER — Other Ambulatory Visit (HOSPITAL_COMMUNITY): Payer: Self-pay | Admitting: Oncology

## 2013-06-11 ENCOUNTER — Encounter: Payer: Self-pay | Admitting: Oncology

## 2013-06-11 ENCOUNTER — Ambulatory Visit (HOSPITAL_COMMUNITY): Payer: Medicare Other

## 2013-06-12 ENCOUNTER — Encounter (HOSPITAL_BASED_OUTPATIENT_CLINIC_OR_DEPARTMENT_OTHER): Payer: Medicare Other

## 2013-06-12 VITALS — BP 112/61 | HR 71 | Temp 97.1°F | Resp 18

## 2013-06-12 DIAGNOSIS — D638 Anemia in other chronic diseases classified elsewhere: Secondary | ICD-10-CM

## 2013-06-12 DIAGNOSIS — D631 Anemia in chronic kidney disease: Secondary | ICD-10-CM

## 2013-06-12 DIAGNOSIS — N182 Chronic kidney disease, stage 2 (mild): Secondary | ICD-10-CM

## 2013-06-12 DIAGNOSIS — E669 Obesity, unspecified: Secondary | ICD-10-CM

## 2013-06-12 DIAGNOSIS — E538 Deficiency of other specified B group vitamins: Secondary | ICD-10-CM

## 2013-06-12 DIAGNOSIS — C9000 Multiple myeloma not having achieved remission: Secondary | ICD-10-CM

## 2013-06-12 DIAGNOSIS — D649 Anemia, unspecified: Secondary | ICD-10-CM

## 2013-06-12 DIAGNOSIS — S32599S Other specified fracture of unspecified pubis, sequela: Secondary | ICD-10-CM

## 2013-06-12 DIAGNOSIS — N179 Acute kidney failure, unspecified: Secondary | ICD-10-CM

## 2013-06-12 DIAGNOSIS — D510 Vitamin B12 deficiency anemia due to intrinsic factor deficiency: Secondary | ICD-10-CM

## 2013-06-12 LAB — CBC
HCT: 30.7 % — ABNORMAL LOW (ref 36.0–46.0)
MCHC: 32.2 g/dL (ref 30.0–36.0)
MCV: 98.1 fL (ref 78.0–100.0)
RDW: 14.7 % (ref 11.5–15.5)

## 2013-06-12 LAB — COMPREHENSIVE METABOLIC PANEL
ALT: 7 U/L (ref 0–35)
AST: 19 U/L (ref 0–37)
Albumin: 3.6 g/dL (ref 3.5–5.2)
CO2: 23 mEq/L (ref 19–32)
Calcium: 10 mg/dL (ref 8.4–10.5)
Sodium: 136 mEq/L (ref 135–145)
Total Protein: 6.8 g/dL (ref 6.0–8.3)

## 2013-06-12 MED ORDER — DARBEPOETIN ALFA-POLYSORBATE 500 MCG/ML IJ SOLN
500.0000 ug | Freq: Once | INTRAMUSCULAR | Status: AC
  Administered 2013-06-12: 500 ug via SUBCUTANEOUS

## 2013-06-12 MED ORDER — SODIUM CHLORIDE 0.9 % IV SOLN
Freq: Once | INTRAVENOUS | Status: AC
  Administered 2013-06-12: 12:00:00 via INTRAVENOUS

## 2013-06-12 MED ORDER — ZOLEDRONIC ACID 4 MG/5ML IV CONC
3.0000 mg | Freq: Once | INTRAVENOUS | Status: AC
  Administered 2013-06-12: 3 mg via INTRAVENOUS
  Filled 2013-06-12: qty 3.75

## 2013-06-12 MED ORDER — CYANOCOBALAMIN 1000 MCG/ML IJ SOLN
INTRAMUSCULAR | Status: AC
  Filled 2013-06-12: qty 1

## 2013-06-12 MED ORDER — SODIUM CHLORIDE 0.9 % IJ SOLN
10.0000 mL | INTRAMUSCULAR | Status: DC | PRN
  Filled 2013-06-12: qty 10

## 2013-06-12 MED ORDER — ZOLEDRONIC ACID 4 MG/5ML IV CONC: 4.0000 mg | Freq: Once | INTRAVENOUS | Status: DC

## 2013-06-12 MED ORDER — CYANOCOBALAMIN 1000 MCG/ML IJ SOLN
1000.0000 ug | Freq: Once | INTRAMUSCULAR | Status: AC
  Administered 2013-06-12: 1000 ug via INTRAMUSCULAR

## 2013-06-12 MED ORDER — HEPARIN SOD (PORK) LOCK FLUSH 100 UNIT/ML IV SOLN
500.0000 [IU] | Freq: Once | INTRAVENOUS | Status: AC | PRN
  Administered 2013-06-12: 500 [IU]
  Filled 2013-06-12: qty 5

## 2013-06-12 MED ORDER — DARBEPOETIN ALFA-POLYSORBATE 500 MCG/ML IJ SOLN
INTRAMUSCULAR | Status: AC
  Filled 2013-06-12: qty 1

## 2013-06-12 NOTE — Progress Notes (Signed)
[name] presents today for injection per MD orders. B12 1000 mcg administered SQ in left Upper Arm. aranesp 500 mcg subcutaneous in lt abdomen.  Pt. Also for zometa today. Port accessed per protocol. Awaiting labs. Administration without incident. Patient tolerated well.

## 2013-06-15 ENCOUNTER — Other Ambulatory Visit (HOSPITAL_COMMUNITY): Payer: Self-pay | Admitting: Oncology

## 2013-06-15 DIAGNOSIS — C9000 Multiple myeloma not having achieved remission: Secondary | ICD-10-CM

## 2013-06-15 MED ORDER — SPIRONOLACTONE 50 MG PO TABS: 50.0000 mg | ORAL_TABLET | Freq: Every day | ORAL | Status: DC

## 2013-07-02 ENCOUNTER — Other Ambulatory Visit (HOSPITAL_COMMUNITY): Payer: Self-pay | Admitting: Oncology

## 2013-07-02 DIAGNOSIS — C9000 Multiple myeloma not having achieved remission: Secondary | ICD-10-CM

## 2013-07-02 DIAGNOSIS — G8929 Other chronic pain: Secondary | ICD-10-CM

## 2013-07-02 MED ORDER — ALPRAZOLAM 0.5 MG PO TABS: 0.5000 mg | ORAL_TABLET | Freq: Three times a day (TID) | ORAL | Status: DC | PRN

## 2013-07-02 MED ORDER — OXYCODONE HCL 10 MG PO TB12: 10.0000 mg | ORAL_TABLET | Freq: Three times a day (TID) | ORAL | Status: DC

## 2013-07-10 ENCOUNTER — Encounter (HOSPITAL_COMMUNITY): Payer: Medicare Other | Attending: Oncology

## 2013-07-10 ENCOUNTER — Other Ambulatory Visit (HOSPITAL_COMMUNITY): Payer: Self-pay | Admitting: Oncology

## 2013-07-10 VITALS — BP 115/66 | HR 79 | Temp 98.2°F | Resp 18

## 2013-07-10 DIAGNOSIS — C9000 Multiple myeloma not having achieved remission: Secondary | ICD-10-CM

## 2013-07-10 DIAGNOSIS — D649 Anemia, unspecified: Secondary | ICD-10-CM

## 2013-07-10 DIAGNOSIS — S32599S Other specified fracture of unspecified pubis, sequela: Secondary | ICD-10-CM

## 2013-07-10 DIAGNOSIS — N182 Chronic kidney disease, stage 2 (mild): Secondary | ICD-10-CM

## 2013-07-10 DIAGNOSIS — D696 Thrombocytopenia, unspecified: Secondary | ICD-10-CM

## 2013-07-10 DIAGNOSIS — D510 Vitamin B12 deficiency anemia due to intrinsic factor deficiency: Secondary | ICD-10-CM

## 2013-07-10 DIAGNOSIS — D631 Anemia in chronic kidney disease: Secondary | ICD-10-CM

## 2013-07-10 DIAGNOSIS — N289 Disorder of kidney and ureter, unspecified: Secondary | ICD-10-CM

## 2013-07-10 DIAGNOSIS — E669 Obesity, unspecified: Secondary | ICD-10-CM

## 2013-07-10 DIAGNOSIS — E538 Deficiency of other specified B group vitamins: Secondary | ICD-10-CM

## 2013-07-10 DIAGNOSIS — K219 Gastro-esophageal reflux disease without esophagitis: Secondary | ICD-10-CM

## 2013-07-10 DIAGNOSIS — N179 Acute kidney failure, unspecified: Secondary | ICD-10-CM

## 2013-07-10 DIAGNOSIS — D638 Anemia in other chronic diseases classified elsewhere: Secondary | ICD-10-CM | POA: Insufficient documentation

## 2013-07-10 LAB — CBC
HCT: 30.3 % — ABNORMAL LOW (ref 36.0–46.0)
MCH: 32.1 pg (ref 26.0–34.0)
MCV: 98.4 fL (ref 78.0–100.0)
RDW: 15 % (ref 11.5–15.5)
WBC: 6.1 10*3/uL (ref 4.0–10.5)

## 2013-07-10 LAB — BASIC METABOLIC PANEL
BUN: 32 mg/dL — ABNORMAL HIGH (ref 6–23)
CO2: 23 mEq/L (ref 19–32)
Chloride: 105 mEq/L (ref 96–112)
GFR calc Af Amer: 29 mL/min — ABNORMAL LOW (ref >90)
Potassium: 4.3 mEq/L (ref 3.5–5.1)

## 2013-07-10 MED ORDER — HEPARIN SOD (PORK) LOCK FLUSH 100 UNIT/ML IV SOLN
500.0000 [IU] | Freq: Once | INTRAVENOUS | Status: AC | PRN
  Administered 2013-07-10: 500 [IU]

## 2013-07-10 MED ORDER — SODIUM CHLORIDE 0.9 % IJ SOLN
10.0000 mL | INTRAMUSCULAR | Status: DC | PRN
  Administered 2013-07-10: 10 mL

## 2013-07-10 MED ORDER — DARBEPOETIN ALFA-POLYSORBATE 500 MCG/ML IJ SOLN
500.0000 ug | Freq: Once | INTRAMUSCULAR | Status: AC
  Administered 2013-07-10: 500 ug via SUBCUTANEOUS

## 2013-07-10 MED ORDER — LORAZEPAM 0.5 MG PO TABS: 0.5000 mg | ORAL_TABLET | ORAL | Status: DC | PRN

## 2013-07-10 MED ORDER — HYDROCODONE-ACETAMINOPHEN 10-325 MG PO TABS: 1.0000 | ORAL_TABLET | Freq: Four times a day (QID) | ORAL | Status: DC | PRN

## 2013-07-10 MED ORDER — CYANOCOBALAMIN 1000 MCG/ML IJ SOLN
1000.0000 ug | Freq: Once | INTRAMUSCULAR | Status: AC
  Administered 2013-07-10: 1000 ug via INTRAMUSCULAR
  Filled 2013-07-10: qty 1

## 2013-07-10 MED ORDER — DARBEPOETIN ALFA-POLYSORBATE 500 MCG/ML IJ SOLN
INTRAMUSCULAR | Status: AC
  Filled 2013-07-10: qty 1

## 2013-07-10 MED ORDER — LORAZEPAM 0.5 MG PO TABS: 0.5000 mg | ORAL_TABLET | ORAL | Status: AC | PRN

## 2013-07-10 MED ORDER — ALPRAZOLAM 0.5 MG PO TABS: 0.5000 mg | ORAL_TABLET | Freq: Three times a day (TID) | ORAL | Status: DC | PRN

## 2013-07-10 MED ORDER — HEPARIN SOD (PORK) LOCK FLUSH 100 UNIT/ML IV SOLN
INTRAVENOUS | Status: AC
  Filled 2013-07-10: qty 5

## 2013-07-10 MED ORDER — ZOLEDRONIC ACID 4 MG/5ML IV CONC
4.0000 mg | Freq: Once | INTRAVENOUS | Status: AC
  Administered 2013-07-10: 4 mg via INTRAVENOUS
  Filled 2013-07-10: qty 5

## 2013-07-10 NOTE — Progress Notes (Signed)
Port accessed with #20 port needle for zometa 4 mg infusion.  Vitamin b 12 1000 mcg given im z-track to upper right deltoid.  Hgb 10.1 Aranesp 500 mcg given sub-q to lower right abd.  Tolerated all well.

## 2013-07-22 ENCOUNTER — Other Ambulatory Visit (HOSPITAL_COMMUNITY): Payer: Self-pay | Admitting: Oncology

## 2013-07-22 ENCOUNTER — Telehealth (HOSPITAL_COMMUNITY): Payer: Self-pay | Admitting: Oncology

## 2013-07-22 DIAGNOSIS — C9000 Multiple myeloma not having achieved remission: Secondary | ICD-10-CM

## 2013-07-22 DIAGNOSIS — G8929 Other chronic pain: Secondary | ICD-10-CM

## 2013-07-22 DIAGNOSIS — M545 Low back pain, unspecified: Secondary | ICD-10-CM

## 2013-07-22 MED ORDER — OXYCODONE HCL ER 10 MG PO T12A: 10.0000 mg | EXTENDED_RELEASE_TABLET | Freq: Three times a day (TID) | ORAL | Status: DC

## 2013-07-22 NOTE — Telephone Encounter (Signed)
MAILED RX TO [facility]

## 2013-07-23 ENCOUNTER — Other Ambulatory Visit (HOSPITAL_COMMUNITY): Payer: Self-pay | Admitting: Oncology

## 2013-07-23 DIAGNOSIS — C9000 Multiple myeloma not having achieved remission: Secondary | ICD-10-CM

## 2013-07-23 MED ORDER — FOLIC ACID 1 MG PO TABS: 1.0000 mg | ORAL_TABLET | Freq: Every day | ORAL | Status: DC

## 2013-07-23 MED ORDER — POTASSIUM CHLORIDE CRYS ER 20 MEQ PO TBCR: 20.0000 meq | EXTENDED_RELEASE_TABLET | Freq: Every day | ORAL | Status: DC

## 2013-08-03 IMAGING — CR DG CHEST 2V
1 series · 1 of 1 positions shown · non-contrast
Comparison: 01/03/2011

CLINICAL DATA: Myeloma.  Productive cough.  Shortness of breath.
Wheezing.

CHEST - 2 VIEW

[view not recorded]
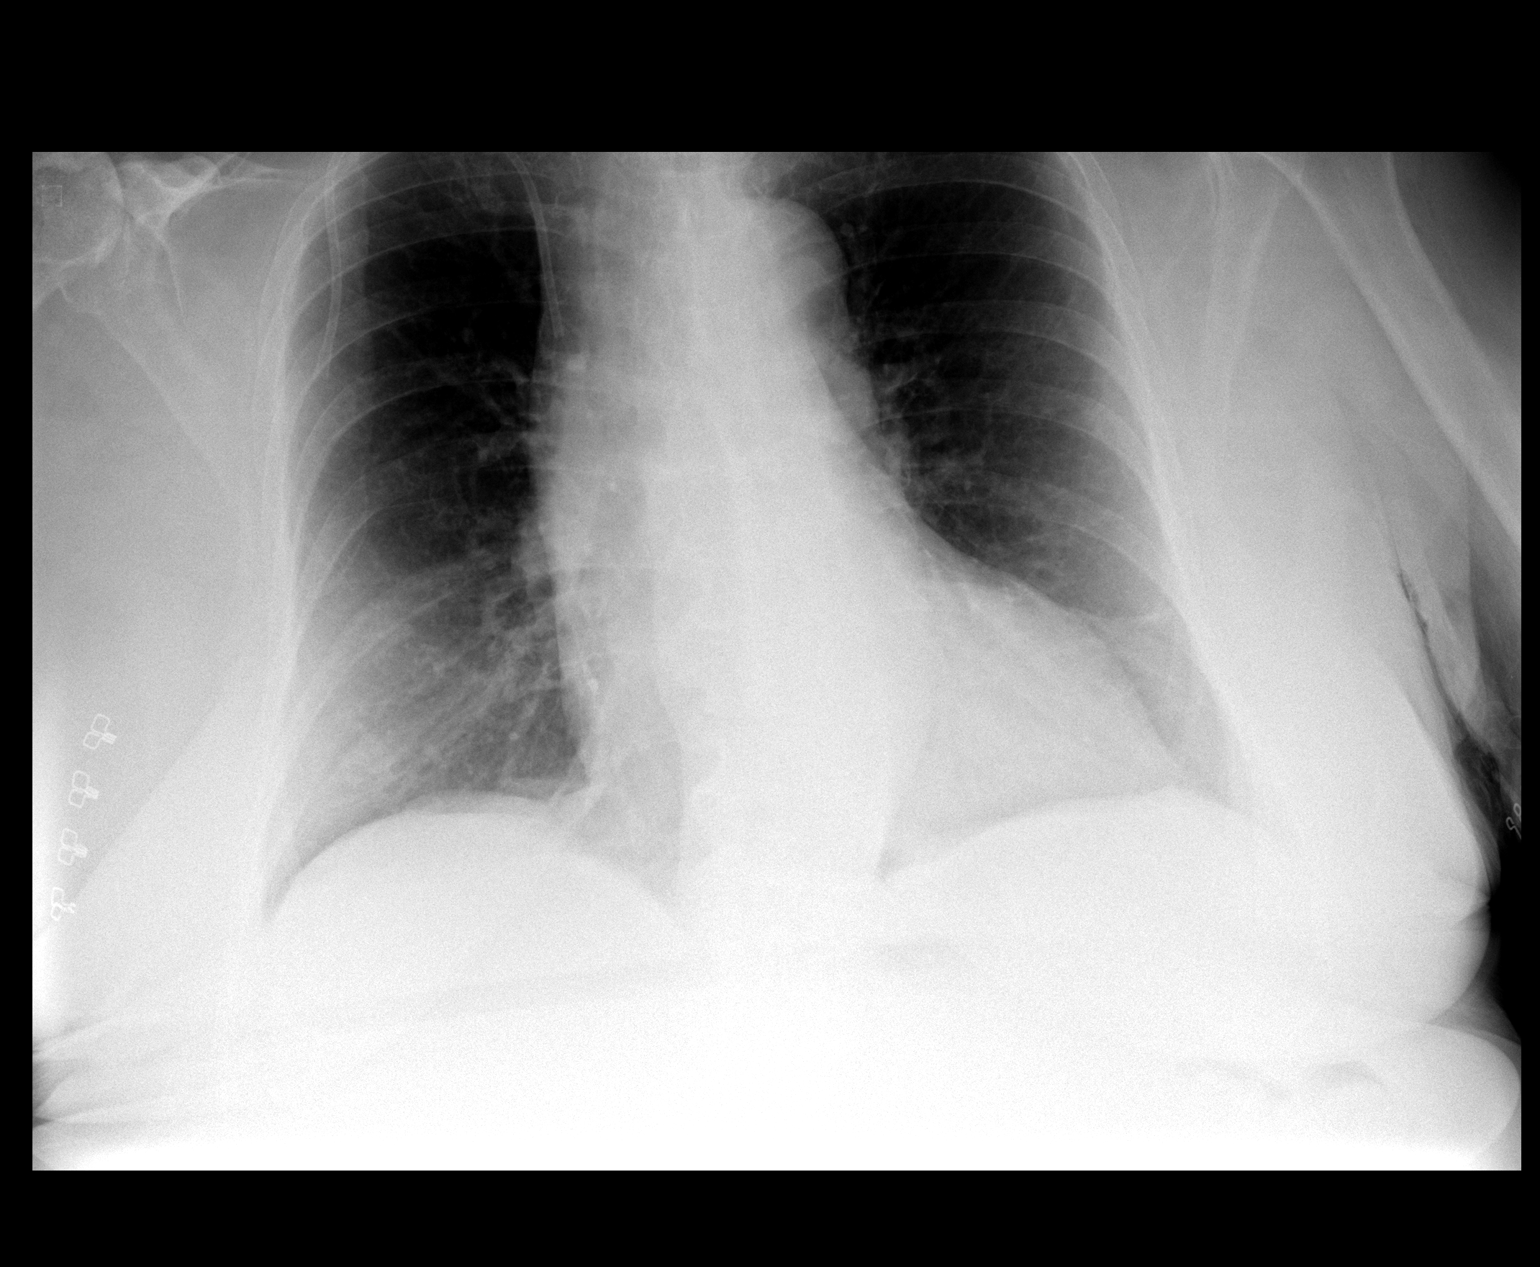

[1 of 1 positions shown; findings below may reference images not displayed]

FINDINGS: Cardiomegaly stable.  Improved aeration of both lungs is
seen.  Mild scarring in the left lung base is unchanged.  No
evidence of acute infiltrate or edema.  No evidence of pleural
effusion.  Right-sided Port-A-Cath remains in appropriate position.
IMPRESSION: Stable cardiomegaly.  No active lung disease.

## 2013-08-07 ENCOUNTER — Encounter (HOSPITAL_COMMUNITY): Payer: Medicare Other | Attending: Oncology

## 2013-08-07 ENCOUNTER — Telehealth (HOSPITAL_COMMUNITY): Payer: Self-pay | Admitting: Oncology

## 2013-08-07 DIAGNOSIS — G8929 Other chronic pain: Secondary | ICD-10-CM

## 2013-08-07 DIAGNOSIS — N179 Acute kidney failure, unspecified: Secondary | ICD-10-CM | POA: Insufficient documentation

## 2013-08-07 DIAGNOSIS — D631 Anemia in chronic kidney disease: Secondary | ICD-10-CM

## 2013-08-07 DIAGNOSIS — D638 Anemia in other chronic diseases classified elsewhere: Secondary | ICD-10-CM | POA: Insufficient documentation

## 2013-08-07 DIAGNOSIS — S32599S Other specified fracture of unspecified pubis, sequela: Secondary | ICD-10-CM

## 2013-08-07 DIAGNOSIS — N182 Chronic kidney disease, stage 2 (mild): Secondary | ICD-10-CM | POA: Insufficient documentation

## 2013-08-07 DIAGNOSIS — M545 Low back pain, unspecified: Secondary | ICD-10-CM | POA: Insufficient documentation

## 2013-08-07 DIAGNOSIS — E538 Deficiency of other specified B group vitamins: Secondary | ICD-10-CM | POA: Insufficient documentation

## 2013-08-07 DIAGNOSIS — C9000 Multiple myeloma not having achieved remission: Secondary | ICD-10-CM | POA: Insufficient documentation

## 2013-08-07 DIAGNOSIS — D649 Anemia, unspecified: Secondary | ICD-10-CM | POA: Insufficient documentation

## 2013-08-07 DIAGNOSIS — E669 Obesity, unspecified: Secondary | ICD-10-CM | POA: Insufficient documentation

## 2013-08-07 DIAGNOSIS — D510 Vitamin B12 deficiency anemia due to intrinsic factor deficiency: Secondary | ICD-10-CM | POA: Insufficient documentation

## 2013-08-07 DIAGNOSIS — IMO0002 Reserved for concepts with insufficient information to code with codable children: Secondary | ICD-10-CM | POA: Insufficient documentation

## 2013-08-07 LAB — CBC
HCT: 28.8 % — ABNORMAL LOW (ref 36.0–46.0)
Hemoglobin: 9.4 g/dL — ABNORMAL LOW (ref 12.0–15.0)
MCH: 32.4 pg (ref 26.0–34.0)
MCV: 99.3 fL (ref 78.0–100.0)
Platelets: 140 10*3/uL — ABNORMAL LOW (ref 150–400)
RBC: 2.9 MIL/uL — ABNORMAL LOW (ref 3.87–5.11)

## 2013-08-07 LAB — BASIC METABOLIC PANEL
BUN: 21 mg/dL (ref 6–23)
CO2: 24 mEq/L (ref 19–32)
Calcium: 10.4 mg/dL (ref 8.4–10.5)
Chloride: 103 mEq/L (ref 96–112)
Creatinine, Ser: 1.93 mg/dL — ABNORMAL HIGH (ref 0.50–1.10)
Glucose, Bld: 94 mg/dL (ref 70–99)
Potassium: 4.3 mEq/L (ref 3.5–5.1)
Sodium: 138 mEq/L (ref 135–145)

## 2013-08-07 MED ORDER — CYANOCOBALAMIN 1000 MCG/ML IJ SOLN
1000.0000 ug | Freq: Once | INTRAMUSCULAR | Status: AC
  Administered 2013-08-07: 1000 ug via INTRAMUSCULAR
  Filled 2013-08-07: qty 1

## 2013-08-07 MED ORDER — ZOLEDRONIC ACID 4 MG/5ML IV CONC
4.0000 mg | Freq: Once | INTRAVENOUS | Status: AC
  Administered 2013-08-07: 4 mg via INTRAVENOUS
  Filled 2013-08-07: qty 5

## 2013-08-07 MED ORDER — SODIUM CHLORIDE 0.9 % IJ SOLN
10.0000 mL | INTRAMUSCULAR | Status: DC | PRN
  Administered 2013-08-07: 10 mL

## 2013-08-07 MED ORDER — ALTEPLASE 2 MG IJ SOLR: 2.0000 mg | Freq: Once | INTRAMUSCULAR | Status: DC | PRN

## 2013-08-07 MED ORDER — HYDROCODONE-ACETAMINOPHEN 10-325 MG PO TABS: 1.0000 | ORAL_TABLET | Freq: Four times a day (QID) | ORAL | Status: DC | PRN

## 2013-08-07 MED ORDER — DARBEPOETIN ALFA-POLYSORBATE 500 MCG/ML IJ SOLN
500.0000 ug | Freq: Once | INTRAMUSCULAR | Status: AC
  Administered 2013-08-07: 500 ug via SUBCUTANEOUS
  Filled 2013-08-07: qty 1

## 2013-08-07 MED ORDER — HEPARIN SOD (PORK) LOCK FLUSH 100 UNIT/ML IV SOLN
500.0000 [IU] | Freq: Once | INTRAVENOUS | Status: AC | PRN
  Administered 2013-08-07: 500 [IU]
  Filled 2013-08-07: qty 5

## 2013-08-07 MED ORDER — OXYCODONE HCL ER 20 MG PO T12A: 20.0000 mg | EXTENDED_RELEASE_TABLET | Freq: Two times a day (BID) | ORAL | Status: DC

## 2013-08-07 MED ORDER — SODIUM CHLORIDE 0.9 % IV SOLN
Freq: Once | INTRAVENOUS | Status: AC
  Administered 2013-08-07: 11:00:00 via INTRAVENOUS

## 2013-08-07 NOTE — Progress Notes (Signed)
Vitamin B-12 1000 mcg. given IM to left upper arm.  Aranesp 500 mcg give Sub-q to left lower abd.  Zometa 4 mg given IV through port access.  Tolerated all well.

## 2013-08-07 NOTE — Telephone Encounter (Signed)
Mailed new oxycontin script to [facility]

## 2013-08-10 ENCOUNTER — Other Ambulatory Visit (HOSPITAL_COMMUNITY): Payer: Self-pay | Admitting: Oncology

## 2013-08-10 DIAGNOSIS — C9000 Multiple myeloma not having achieved remission: Secondary | ICD-10-CM

## 2013-08-10 LAB — KAPPA/LAMBDA LIGHT CHAINS
Kappa free light chain: 2.67 mg/dL — ABNORMAL HIGH (ref 0.33–1.94)
Lambda free light chains: 65.5 mg/dL — ABNORMAL HIGH (ref 0.57–2.63)

## 2013-08-10 MED ORDER — SPIRONOLACTONE 50 MG PO TABS: 50.0000 mg | ORAL_TABLET | Freq: Every day | ORAL | Status: DC

## 2013-08-11 LAB — MULTIPLE MYELOMA PANEL, SERUM
Albumin ELP: 57.3 % (ref 55.8–66.1)
Alpha-2-Globulin: 13.3 % — ABNORMAL HIGH (ref 7.1–11.8)
Beta Globulin: 5.9 % (ref 4.7–7.2)
IgA: 282 mg/dL (ref 69–380)
IgG (Immunoglobin G), Serum: 539 mg/dL — ABNORMAL LOW (ref 690–1700)
M-Spike, %: 0.29 g/dL
Total Protein: 6.5 g/dL (ref 6.0–8.3)

## 2013-08-12 ENCOUNTER — Telehealth (HOSPITAL_COMMUNITY): Payer: Self-pay | Admitting: *Deleted

## 2013-08-12 ENCOUNTER — Other Ambulatory Visit (HOSPITAL_COMMUNITY): Payer: Self-pay | Admitting: Oncology

## 2013-08-12 DIAGNOSIS — S32599S Other specified fracture of unspecified pubis, sequela: Secondary | ICD-10-CM

## 2013-08-12 DIAGNOSIS — G8929 Other chronic pain: Secondary | ICD-10-CM

## 2013-08-12 DIAGNOSIS — C9000 Multiple myeloma not having achieved remission: Secondary | ICD-10-CM

## 2013-08-12 MED ORDER — OXYCODONE HCL ER 20 MG PO T12A: 20.0000 mg | EXTENDED_RELEASE_TABLET | Freq: Two times a day (BID) | ORAL | Status: DC

## 2013-08-24 ENCOUNTER — Other Ambulatory Visit (HOSPITAL_COMMUNITY): Payer: Medicare Other

## 2013-08-27 ENCOUNTER — Ambulatory Visit (HOSPITAL_COMMUNITY): Payer: Medicare Other

## 2013-08-31 ENCOUNTER — Ambulatory Visit (HOSPITAL_COMMUNITY): Payer: Medicare Other | Admitting: Oncology

## 2013-09-03 NOTE — Progress Notes (Signed)
[name], MD [address] [place] [address]  Multiple myeloma - Plan: CBC with Differential, Comprehensive metabolic panel, Beta 2 microglobuline, serum, Multiple myeloma panel, serum, Kappa/lambda light chains, C-reactive protein  CURRENT THERAPY: Monthly Zometa.  Palliation for MM  INTERVAL HISTORY: [name] 79 y.o. female returns for  regular  visit for followup of Multiple myeloma: Patient initially presented with disease nearly 8 years ago. Currently her light chains as well as M spike are essentially unchanged. Patient has previously expressed no interest in reinitiating treatments. Continue with monthly Zometa.   [name] says she has good days and bad days.  She reports that on her bad days, she feels like a "zombie."  She denies any specific complaints during those bad days.    She continues to have right shoulder/arm pain with severely decreased ROM.  She is on pain medication for this which controls the symptoms well.  She mails in her Rx for Oxycontin where she gets assistance for the cost.    I have reviewed her labs with her. I personally reviewed and went over laboratory results with the patient.  Her MM is stable without any clear evidence of progression.  She again expresses her desire to not be treated in the future.  However, if and when her MM labs start to change, we will inform her so she can make the best decision for her.  Additionally, we will continue with supportive care.   I have reviewed her Aranesp supportive therapy plan.  I provided the patient education regarding Aranesp.  I have reviewed her monthly Zometa plan and I have educated the patient on the role of Zometa.   Hematologically, she denies any complaints and ROS questioning is negative.     Past Medical History  Diagnosis Date  . Essential hypertension, benign     LVH; RVH; normal EF in 04/2011 with the basilar inferior wall akinesis  . Edema   .  Obesity   . Osteoporosis     Lumbar and thoracic compression fractures  . Multiple myeloma 2007    IgA lambda  . Gastroesophageal reflux disease   . Pernicious anemia     Plus thrombocytopenia;H./H.-9.8/29 in 12/2010  . Chronic kidney disease, stage 2, mildly decreased GFR     Creatinine of 1.3 in 09/2010; acute renal failure in 11/2010 secondary to diuretics with hyperkalemia secondary to Spiriva lactone  . Peripheral neuropathy     Complicating chemotherapy  . Chronic low back pain   . Chronic steroid use   . History of candidiasis   . Allergy     Revlimid  . Anemia of chronic disease   . B12 deficiency   . Hyperglycemia, drug-induced 03/09/2012  . Fracture of multiple pubic rami 03/08/2012  . Thrombocytopenia 03/10/2012  . Emphysema     has Essential hypertension, benign; EDEMA; Obesity; Osteoporosis; Multiple myeloma; Gastroesophageal reflux disease; Pernicious anemia; Chronic kidney disease, stage 2, mildly decreased GFR; Peripheral neuropathy; Chronic low back pain; Chronic steroid use; Anemia of chronic disease; B12 deficiency; Secondary cardiomyopathy, unspecified; Acute bronchitis; Physical deconditioning; Fracture Of Multiple Pubic Rami; Hypokalemia; Hyperglycemia, drug-induced; Thrombocytopenia; Bradycardia; Candidal intertrigo; Facial contusion; Possible Pneumonia, organism unspecified; Bronchospasm; Hyponatremia; Anemia; Fever; Emphysema; Fall; UTI (urinary tract infection); Bilateral pubic rami fractures; Weakness generalized; Medication side effect; and Acute renal failure on her problem list.     is allergic to lenalidomide.  We administered darbepoetin alfa-polysorbate and heparin lock flush.  Past  Surgical History  Procedure Laterality Date  . Total abdominal hysterectomy w/ bilateral salpingoophorectomy      Incidental appendectomy  . Rotator cuff repair      Left  . Vein surgery      Ligation and stripping bilaterally  . Orif ankle fracture      Right  . Carpal  tunnel release      Right  . Central venous catheter tunneled insertion single lumen  2012    Denies any headaches, dizziness, double vision, fevers, chills, night sweats, nausea, vomiting, diarrhea, constipation, chest pain, heart palpitations, shortness of breath, blood in stool, black tarry stool, urinary pain, urinary burning, urinary frequency, hematuria.   PHYSICAL EXAMINATION  ECOG PERFORMANCE STATUS: 3 - Symptomatic, >50% confined to bed  There were no vitals filed for this visit.  GENERAL:alert, no distress, well nourished, well developed, comfortable, cooperative, obese and smiling SKIN: skin color, texture, turgor are normal, no rashes or significant lesions HEAD: Normocephalic, 3-4 CM Punched out lesion on right parietal side of head with depression of skull. EYES: normal, PERRLA, EOMI, Conjunctiva are pink and non-injected EARS: External ears normal OROPHARYNX:mucous membranes are moist  NECK: supple, no adenopathy, thyroid normal size, non-tender, without nodularity, no stridor, non-tender, trachea midline LYMPH:  no palpable lymphadenopathy BREAST:not examined LUNGS: clear to auscultation and percussion HEART: regular rate & rhythm, no murmurs, no gallops, S1 normal and S2 normal ABDOMEN:abdomen soft, non-tender and normal bowel sounds BACK: Back symmetric, no curvature. EXTREMITIES:less then 2 second capillary refill, no skin discoloration, no cyanosis, positive findings:  edema B/L 2+ edema in B/L legs  NEURO: alert & oriented x 3 with fluent speech, no focal motor/sensory deficits, in wheelchair   LABORATORY DATA: CBC    Component Value Date/Time   WBC 7.0 09/04/2013 1023   RBC 2.85* 09/04/2013 1023   RBC 2.73* 10/02/2009 0425   HGB 9.2* 09/04/2013 1023   HCT 28.0* 09/04/2013 1023   PLT 119* 09/04/2013 1023   MCV 98.2 09/04/2013 1023   MCH 32.3 09/04/2013 1023   MCHC 32.9 09/04/2013 1023   RDW 14.5 09/04/2013 1023   LYMPHSABS 0.9 09/04/2013 1023    MONOABS 0.7 09/04/2013 1023   EOSABS 0.1 09/04/2013 1023   BASOSABS 0.0 09/04/2013 1023      Chemistry      Component Value Date/Time   NA 138 08/07/2013 1006   K 4.3 08/07/2013 1006   CL 103 08/07/2013 1006   CO2 24 08/07/2013 1006   BUN 21 08/07/2013 1006   CREATININE 1.93* 08/07/2013 1006   CREATININE 2.22* 10/22/2011 1000      Component Value Date/Time   CALCIUM 10.4 08/07/2013 1006   ALKPHOS 92 06/12/2013 1058   AST 19 06/12/2013 1058   ALT 7 06/12/2013 1058   BILITOT 0.4 06/12/2013 1058     Results for ANALYSIA, DUNGEE (MRN [id number]) as of 09/03/2013 16:25  Ref. Range 08/07/2013 10:06  Albumin ELP Latest Range: 55.8-66.1 % 57.3  Alpha-1-Globulin Latest Range: 2.9-4.9 % 5.6 (H)  Alpha-2-Globulin Latest Range: 7.1-11.8 % 13.3 (H)  Beta Globulin Latest Range: 4.7-7.2 % 5.9  Beta 2 Latest Range: 3.2-6.5 % 5.6  Gamma Globulin Latest Range: 11.1-18.8 % 12.3  M-SPIKE, % No range found 0.29  SPE Interp. No range found (NOTE)  Comment No range found (NOTE)  IgG (Immunoglobin G), Serum Latest Range: 812-275-8777 mg/dL 409 (L)  IgA Latest Range: 69-380 mg/dL 811  IgM, Serum Latest Range: 52-322 mg/dL 41 (L)  Kappa free light  chain Latest Range: 0.33-1.94 mg/dL 4.09 (H)  Lamda free light chains Latest Range: 0.57-2.63 mg/dL 81.19 (H)  Kappa, lamda light chain ratio Latest Range: 0.26-1.65  0.04 (L)       ASSESSMENT:  1. Multiple myeloma: Patient initially presented with disease nearly 8 years ago. Currently her light chains as well as M spike are essentially unchanged. Patient has previously expressed no interest in reinitiating treatments. Continue with monthly Zometa.  2. Right shoulder pain 3. Back pain, secondary to osteoporosis, degenerative arthritis, degenerative disc disease, as well as myelomatous involvement. She does have a history of fracture pubic rami 4. Severe hypogammaglobulinemia secondary to her myeloma not an issue presently 5. Anemia secondary to her myeloma  with a poor response to Aranesp in past, but restarted and receiving every 4 weeks to decrease need for transfusion dependency.   Patient Active Problem List   Diagnosis Date Noted  . Fall 05/29/2012  . UTI (urinary tract infection) 05/29/2012  . Bilateral pubic rami fractures 05/29/2012  . Weakness generalized 05/29/2012  . Medication side effect 05/29/2012  . Acute renal failure 05/29/2012  . Emphysema 04/18/2012  . Possible Pneumonia, organism unspecified 04/17/2012  . Bronchospasm 04/17/2012  . Hyponatremia 04/17/2012  . Anemia 04/17/2012  . Fever 04/17/2012  . Candidal intertrigo 03/11/2012  . Facial contusion 03/11/2012  . Thrombocytopenia 03/10/2012  . Bradycardia 03/10/2012  . Hypokalemia 03/09/2012  . Hyperglycemia, drug-induced 03/09/2012  . Physical deconditioning 03/08/2012  . Fracture Of Multiple Pubic Rami 03/08/2012  . Acute bronchitis 02/29/2012  . Secondary cardiomyopathy, unspecified 01/23/2012  . Anemia of chronic disease 04/14/2011  . B12 deficiency 04/14/2011  . Obesity   . Osteoporosis   . Multiple myeloma   . Gastroesophageal reflux disease   . Pernicious anemia   . Chronic kidney disease, stage 2, mildly decreased GFR   . Peripheral neuropathy   . Chronic low back pain   . Chronic steroid use   . Essential hypertension, benign 04/29/2009  . EDEMA 04/29/2009      PLAN:  1. I personally reviewed and went over laboratory results with the patient. 2. Labs every 4 weeks: CBC diff 3. Labs every 8 weeks: CBC diff, CMET, CRP, MM panel 4. Continue Aranesp every 4 weeks  5. Continue Monthly Zometa 6. Return in 3 months for follow-up, sooner if needed.   THERAPY PLAN:  The patient has declined future intervention for Multiple Myeloma.  She is on Zometa every 4 weeks and Aranesp every 4 weeks for supportive therapy.  We will continue to provide her pain medication for her pain.  We will continue to palliate the best we can.  All questions were  answered. The patient knows to call the clinic with any problems, questions or concerns. We can certainly see the patient much sooner if necessary.  Patient and plan discussed with Dr. Annamarie Dawley and she is in agreement with the aforementioned.   [name]

## 2013-09-04 ENCOUNTER — Encounter (HOSPITAL_COMMUNITY): Payer: Medicare Other | Attending: Oncology

## 2013-09-04 ENCOUNTER — Encounter (HOSPITAL_BASED_OUTPATIENT_CLINIC_OR_DEPARTMENT_OTHER): Payer: Medicare Other | Admitting: Oncology

## 2013-09-04 ENCOUNTER — Telehealth (HOSPITAL_COMMUNITY): Payer: Self-pay | Admitting: Oncology

## 2013-09-04 ENCOUNTER — Encounter (HOSPITAL_COMMUNITY): Payer: Self-pay | Admitting: Oncology

## 2013-09-04 DIAGNOSIS — C9000 Multiple myeloma not having achieved remission: Secondary | ICD-10-CM | POA: Insufficient documentation

## 2013-09-04 DIAGNOSIS — G8929 Other chronic pain: Secondary | ICD-10-CM | POA: Insufficient documentation

## 2013-09-04 DIAGNOSIS — E669 Obesity, unspecified: Secondary | ICD-10-CM | POA: Insufficient documentation

## 2013-09-04 DIAGNOSIS — M545 Low back pain, unspecified: Secondary | ICD-10-CM | POA: Insufficient documentation

## 2013-09-04 DIAGNOSIS — E538 Deficiency of other specified B group vitamins: Secondary | ICD-10-CM

## 2013-09-04 DIAGNOSIS — D510 Vitamin B12 deficiency anemia due to intrinsic factor deficiency: Secondary | ICD-10-CM

## 2013-09-04 DIAGNOSIS — M25519 Pain in unspecified shoulder: Secondary | ICD-10-CM

## 2013-09-04 DIAGNOSIS — D6481 Anemia due to antineoplastic chemotherapy: Secondary | ICD-10-CM

## 2013-09-04 DIAGNOSIS — S32599S Other specified fracture of unspecified pubis, sequela: Secondary | ICD-10-CM

## 2013-09-04 DIAGNOSIS — N179 Acute kidney failure, unspecified: Secondary | ICD-10-CM | POA: Insufficient documentation

## 2013-09-04 DIAGNOSIS — D649 Anemia, unspecified: Secondary | ICD-10-CM | POA: Insufficient documentation

## 2013-09-04 DIAGNOSIS — M810 Age-related osteoporosis without current pathological fracture: Secondary | ICD-10-CM

## 2013-09-04 DIAGNOSIS — N182 Chronic kidney disease, stage 2 (mild): Secondary | ICD-10-CM

## 2013-09-04 DIAGNOSIS — D801 Nonfamilial hypogammaglobulinemia: Secondary | ICD-10-CM

## 2013-09-04 DIAGNOSIS — IMO0002 Reserved for concepts with insufficient information to code with codable children: Secondary | ICD-10-CM | POA: Insufficient documentation

## 2013-09-04 DIAGNOSIS — D638 Anemia in other chronic diseases classified elsewhere: Secondary | ICD-10-CM | POA: Insufficient documentation

## 2013-09-04 LAB — CBC WITH DIFFERENTIAL/PLATELET
Eosinophils Absolute: 0.1 10*3/uL (ref 0.0–0.7)
Eosinophils Relative: 1 % (ref 0–5)
HCT: 28 % — ABNORMAL LOW (ref 36.0–46.0)
Lymphocytes Relative: 12 % (ref 12–46)
Lymphs Abs: 0.9 10*3/uL (ref 0.7–4.0)
MCH: 32.3 pg (ref 26.0–34.0)
MCV: 98.2 fL (ref 78.0–100.0)
Monocytes Absolute: 0.7 10*3/uL (ref 0.1–1.0)
Monocytes Relative: 10 % (ref 3–12)
Neutro Abs: 5.3 10*3/uL (ref 1.7–7.7)
Platelets: 119 10*3/uL — ABNORMAL LOW (ref 150–400)
RBC: 2.85 MIL/uL — ABNORMAL LOW (ref 3.87–5.11)
WBC: 7 10*3/uL (ref 4.0–10.5)

## 2013-09-04 MED ORDER — CYANOCOBALAMIN 1000 MCG/ML IJ SOLN
INTRAMUSCULAR | Status: AC
  Filled 2013-09-04: qty 1

## 2013-09-04 MED ORDER — CYANOCOBALAMIN 1000 MCG/ML IJ SOLN
1000.0000 ug | Freq: Once | INTRAMUSCULAR | Status: AC
  Administered 2013-09-04: 1000 ug via INTRAMUSCULAR

## 2013-09-04 MED ORDER — ZOLEDRONIC ACID 4 MG/5ML IV CONC
4.0000 mg | Freq: Once | INTRAVENOUS | Status: AC
  Administered 2013-09-04: 4 mg via INTRAVENOUS
  Filled 2013-09-04: qty 5

## 2013-09-04 MED ORDER — OXYCODONE HCL ER 20 MG PO T12A: 20.0000 mg | EXTENDED_RELEASE_TABLET | Freq: Two times a day (BID) | ORAL | Status: DC

## 2013-09-04 MED ORDER — SODIUM CHLORIDE 0.9 % IJ SOLN: 10.0000 mL | INTRAMUSCULAR | Status: DC | PRN

## 2013-09-04 MED ORDER — SODIUM CHLORIDE 0.9 % IV SOLN
Freq: Once | INTRAVENOUS | Status: AC
  Administered 2013-09-04: 11:00:00 via INTRAVENOUS

## 2013-09-04 MED ORDER — DARBEPOETIN ALFA-POLYSORBATE 500 MCG/ML IJ SOLN
INTRAMUSCULAR | Status: AC
  Filled 2013-09-04: qty 1

## 2013-09-04 MED ORDER — DARBEPOETIN ALFA-POLYSORBATE 500 MCG/ML IJ SOLN
500.0000 ug | Freq: Once | INTRAMUSCULAR | Status: AC
  Administered 2013-09-04: 500 ug via SUBCUTANEOUS

## 2013-09-04 MED ORDER — HEPARIN SOD (PORK) LOCK FLUSH 100 UNIT/ML IV SOLN
500.0000 [IU] | Freq: Once | INTRAVENOUS | Status: AC | PRN
  Administered 2013-09-04: 500 [IU]
  Filled 2013-09-04: qty 5

## 2013-09-04 NOTE — Progress Notes (Signed)
[name] presents today for injection per MD orders. Aranesp administered SQ in left Abdomen. Administration without incident. Patient tolerated well.

## 2013-09-04 NOTE — Patient Instructions (Signed)
[facility] Discharge Instructions  RECOMMENDATIONS MADE BY THE CONSULTANT AND ANY TEST RESULTS WILL BE SENT TO YOUR REFERRING PHYSICIAN.  EXAM FINDINGS BY THE PHYSICIAN TODAY AND SIGNS OR SYMPTOMS TO REPORT TO CLINIC OR PRIMARY PHYSICIAN: Exam and findings as discussed by Dellis Anes, PA-C.  Will continue labs, zometa, B12 and aranesp every 4 weeks.  Report uncontrolled pain or other problems.  MEDICATIONS PRESCRIBED:  none  INSTRUCTIONS/FOLLOW-UP: Follow-up as scheduled with labs, zometa, B12 and aranesp Office visit for exam in 3 months.  Thank you for choosing [facility] to provide your oncology and hematology care.  To afford each patient quality time with our providers, please arrive at least 15 minutes before your scheduled appointment time.  With your help, our goal is to use those 15 minutes to complete the necessary work-up to ensure our physicians have the information they need to help with your evaluation and healthcare recommendations.    Effective January 1st, 2014, we ask that you re-schedule your appointment with our physicians should you arrive 10 or more minutes late for your appointment.  We strive to give you quality time with our providers, and arriving late affects you and other patients whose appointments are after yours.    Again, thank you for choosing [facility].  Our hope is that these requests will decrease the amount of time that you wait before being seen by our physicians.       _____________________________________________________________  Should you have questions after your visit to [facility], please contact our office at [phone] between the hours of 8:30 a.m. and 5:00 p.m.  Voicemails left after 4:30 p.m. will not be returned until the following business day.  For prescription refill requests, have your pharmacy contact our office with your prescription refill request.

## 2013-09-04 NOTE — Progress Notes (Unsigned)
Nadene Rubins tolerated infusion well - see MAR for zometa, B12, and aranesp details.  Discharged by Terisa Starr, RN.

## 2013-09-04 NOTE — Telephone Encounter (Signed)
Put oxycodone rx in the mail bin to be mailed out on 09/07/13.

## 2013-09-21 ENCOUNTER — Telehealth (HOSPITAL_COMMUNITY): Payer: Self-pay | Admitting: Oncology

## 2013-09-21 ENCOUNTER — Other Ambulatory Visit (HOSPITAL_COMMUNITY): Payer: Self-pay | Admitting: Oncology

## 2013-09-21 DIAGNOSIS — G8929 Other chronic pain: Secondary | ICD-10-CM

## 2013-09-21 DIAGNOSIS — S32599S Other specified fracture of unspecified pubis, sequela: Secondary | ICD-10-CM

## 2013-09-21 DIAGNOSIS — C9000 Multiple myeloma not having achieved remission: Secondary | ICD-10-CM

## 2013-09-21 MED ORDER — HYDROCODONE-ACETAMINOPHEN 10-325 MG PO TABS: 1.0000 | ORAL_TABLET | Freq: Four times a day (QID) | ORAL | Status: DC | PRN

## 2013-09-21 MED ORDER — OXYCODONE HCL ER 20 MG PO T12A: 20.0000 mg | EXTENDED_RELEASE_TABLET | Freq: Two times a day (BID) | ORAL | Status: DC

## 2013-09-21 NOTE — Telephone Encounter (Signed)
MAILED RX TO [facility]

## 2013-09-25 ENCOUNTER — Other Ambulatory Visit (HOSPITAL_COMMUNITY): Payer: Self-pay | Admitting: Oncology

## 2013-09-25 DIAGNOSIS — C9000 Multiple myeloma not having achieved remission: Secondary | ICD-10-CM

## 2013-09-25 MED ORDER — ALPRAZOLAM 0.5 MG PO TABS: 0.5000 mg | ORAL_TABLET | Freq: Three times a day (TID) | ORAL | Status: AC | PRN

## 2013-10-02 ENCOUNTER — Encounter (HOSPITAL_COMMUNITY): Payer: Medicare Other | Attending: Oncology

## 2013-10-02 ENCOUNTER — Other Ambulatory Visit (HOSPITAL_COMMUNITY): Payer: Self-pay | Admitting: Oncology

## 2013-10-02 ENCOUNTER — Encounter (HOSPITAL_COMMUNITY): Payer: Medicare Other

## 2013-10-02 VITALS — BP 129/84 | HR 96 | Temp 97.1°F | Resp 20 | Wt 221.0 lb

## 2013-10-02 DIAGNOSIS — IMO0002 Reserved for concepts with insufficient information to code with codable children: Secondary | ICD-10-CM | POA: Insufficient documentation

## 2013-10-02 DIAGNOSIS — E538 Deficiency of other specified B group vitamins: Secondary | ICD-10-CM

## 2013-10-02 DIAGNOSIS — N179 Acute kidney failure, unspecified: Secondary | ICD-10-CM | POA: Insufficient documentation

## 2013-10-02 DIAGNOSIS — S32599S Other specified fracture of unspecified pubis, sequela: Secondary | ICD-10-CM

## 2013-10-02 DIAGNOSIS — D649 Anemia, unspecified: Secondary | ICD-10-CM | POA: Insufficient documentation

## 2013-10-02 DIAGNOSIS — E669 Obesity, unspecified: Secondary | ICD-10-CM | POA: Insufficient documentation

## 2013-10-02 DIAGNOSIS — N182 Chronic kidney disease, stage 2 (mild): Secondary | ICD-10-CM

## 2013-10-02 DIAGNOSIS — C9000 Multiple myeloma not having achieved remission: Secondary | ICD-10-CM

## 2013-10-02 DIAGNOSIS — D510 Vitamin B12 deficiency anemia due to intrinsic factor deficiency: Secondary | ICD-10-CM | POA: Insufficient documentation

## 2013-10-02 DIAGNOSIS — D638 Anemia in other chronic diseases classified elsewhere: Secondary | ICD-10-CM | POA: Insufficient documentation

## 2013-10-02 DIAGNOSIS — D631 Anemia in chronic kidney disease: Secondary | ICD-10-CM

## 2013-10-02 LAB — COMPREHENSIVE METABOLIC PANEL
ALT: 8 U/L (ref 0–35)
AST: 23 U/L (ref 0–37)
Albumin: 4.2 g/dL (ref 3.5–5.2)
Alkaline Phosphatase: 94 U/L (ref 39–117)
BUN: 33 mg/dL — ABNORMAL HIGH (ref 6–23)
Chloride: 100 mEq/L (ref 96–112)
Potassium: 4.6 mEq/L (ref 3.5–5.1)
Sodium: 133 mEq/L — ABNORMAL LOW (ref 135–145)
Total Bilirubin: 0.4 mg/dL (ref 0.3–1.2)
Total Protein: 7.4 g/dL (ref 6.0–8.3)

## 2013-10-02 LAB — CBC WITH DIFFERENTIAL/PLATELET
Basophils Relative: 0 % (ref 0–1)
Eosinophils Absolute: 0.1 10*3/uL (ref 0.0–0.7)
HCT: 30.5 % — ABNORMAL LOW (ref 36.0–46.0)
Lymphocytes Relative: 11 % — ABNORMAL LOW (ref 12–46)
MCH: 32.1 pg (ref 26.0–34.0)
MCHC: 32.8 g/dL (ref 30.0–36.0)
Neutro Abs: 6 10*3/uL (ref 1.7–7.7)
Neutrophils Relative %: 79 % — ABNORMAL HIGH (ref 43–77)
Platelets: 142 10*3/uL — ABNORMAL LOW (ref 150–400)
RDW: 14.7 % (ref 11.5–15.5)

## 2013-10-02 LAB — C-REACTIVE PROTEIN: CRP: <0.5 mg/dL — ABNORMAL LOW (ref <0.60)

## 2013-10-02 MED ORDER — CYANOCOBALAMIN 1000 MCG/ML IJ SOLN
1000.0000 ug | Freq: Once | INTRAMUSCULAR | Status: AC
  Administered 2013-10-02: 1000 ug via INTRAMUSCULAR

## 2013-10-02 MED ORDER — CYANOCOBALAMIN 1000 MCG/ML IJ SOLN
INTRAMUSCULAR | Status: AC
  Filled 2013-10-02: qty 1

## 2013-10-02 MED ORDER — DARBEPOETIN ALFA-POLYSORBATE 500 MCG/ML IJ SOLN
500.0000 ug | Freq: Once | INTRAMUSCULAR | Status: AC
  Administered 2013-10-02: 500 ug via SUBCUTANEOUS
  Filled 2013-10-02: qty 1

## 2013-10-02 MED ORDER — HEPARIN SOD (PORK) LOCK FLUSH 100 UNIT/ML IV SOLN
INTRAVENOUS | Status: AC
  Filled 2013-10-02: qty 5

## 2013-10-02 MED ORDER — SODIUM CHLORIDE 0.9 % IV SOLN
Freq: Once | INTRAVENOUS | Status: AC
  Administered 2013-10-02: 10:00:00 via INTRAVENOUS

## 2013-10-02 MED ORDER — ZOLEDRONIC ACID 4 MG/5ML IV CONC
2.0000 mg | Freq: Once | INTRAVENOUS | Status: AC
  Administered 2013-10-02: 2 mg via INTRAVENOUS
  Filled 2013-10-02: qty 2.5

## 2013-10-02 MED ORDER — HEPARIN SOD (PORK) LOCK FLUSH 100 UNIT/ML IV SOLN
500.0000 [IU] | Freq: Once | INTRAVENOUS | Status: AC | PRN
  Administered 2013-10-02: 500 [IU]

## 2013-10-05 LAB — BETA 2 MICROGLOBULIN, SERUM: Beta-2 Microglobulin: 9.97 mg/L — ABNORMAL HIGH (ref 1.01–1.73)

## 2013-10-05 LAB — KAPPA/LAMBDA LIGHT CHAINS: Kappa free light chain: 2.67 mg/dL — ABNORMAL HIGH (ref 0.33–1.94)

## 2013-10-06 LAB — MULTIPLE MYELOMA PANEL, SERUM
Albumin ELP: 58.3 % (ref 55.8–66.1)
Alpha-1-Globulin: 5.6 % — ABNORMAL HIGH (ref 2.9–4.9)
Alpha-2-Globulin: 12.7 % — ABNORMAL HIGH (ref 7.1–11.8)
Beta Globulin: 6 % (ref 4.7–7.2)
Gamma Globulin: 11.9 % (ref 11.1–18.8)
IgA: 292 mg/dL (ref 69–380)
IgG (Immunoglobin G), Serum: 560 mg/dL — ABNORMAL LOW (ref 690–1700)
IgM, Serum: 32 mg/dL — ABNORMAL LOW (ref 52–322)
M-Spike, %: 0.33 g/dL

## 2013-10-12 ENCOUNTER — Telehealth (HOSPITAL_COMMUNITY): Payer: Self-pay | Admitting: Oncology

## 2013-10-12 ENCOUNTER — Other Ambulatory Visit (HOSPITAL_COMMUNITY): Payer: Self-pay | Admitting: Oncology

## 2013-10-12 ENCOUNTER — Telehealth (HOSPITAL_COMMUNITY): Payer: Self-pay

## 2013-10-12 ENCOUNTER — Other Ambulatory Visit (HOSPITAL_COMMUNITY): Payer: Medicare Other

## 2013-10-12 DIAGNOSIS — C9000 Multiple myeloma not having achieved remission: Secondary | ICD-10-CM

## 2013-10-12 MED ORDER — HYDROCODONE-ACETAMINOPHEN 10-325 MG PO TABS: 1.0000 | ORAL_TABLET | Freq: Four times a day (QID) | ORAL | Status: DC | PRN

## 2013-10-12 NOTE — Telephone Encounter (Signed)
Message copied by Evelena Leyden on Mon Oct 12, 2013  1:20 PM ------      Message from: Ellouise Newer      Created: Mon Oct 12, 2013 12:08 PM       Rx is ready and in drawer for pick-up                   Needs refill on Hydrocodone                  ------

## 2013-10-12 NOTE — Telephone Encounter (Signed)
Message left for Dorene Sorrow to come by and pick -up prescription.

## 2013-10-14 ENCOUNTER — Telehealth (HOSPITAL_COMMUNITY): Payer: Self-pay | Admitting: Oncology

## 2013-10-19 ENCOUNTER — Encounter (HOSPITAL_BASED_OUTPATIENT_CLINIC_OR_DEPARTMENT_OTHER): Payer: Medicare Other

## 2013-10-19 ENCOUNTER — Other Ambulatory Visit (HOSPITAL_COMMUNITY): Payer: Self-pay | Admitting: Oncology

## 2013-10-19 ENCOUNTER — Encounter (HOSPITAL_BASED_OUTPATIENT_CLINIC_OR_DEPARTMENT_OTHER): Payer: Medicare Other | Admitting: Oncology

## 2013-10-19 ENCOUNTER — Other Ambulatory Visit (HOSPITAL_COMMUNITY): Payer: Self-pay | Admitting: Hematology and Oncology

## 2013-10-19 ENCOUNTER — Telehealth (HOSPITAL_COMMUNITY): Payer: Self-pay | Admitting: Oncology

## 2013-10-19 VITALS — BP 118/52 | HR 83 | Temp 98.1°F | Resp 20

## 2013-10-19 DIAGNOSIS — C9000 Multiple myeloma not having achieved remission: Secondary | ICD-10-CM

## 2013-10-19 DIAGNOSIS — G609 Hereditary and idiopathic neuropathy, unspecified: Secondary | ICD-10-CM

## 2013-10-19 LAB — CBC WITH DIFFERENTIAL/PLATELET
Eosinophils Absolute: 0.1 10*3/uL (ref 0.0–0.7)
Eosinophils Relative: 1 % (ref 0–5)
HCT: 31.8 % — ABNORMAL LOW (ref 36.0–46.0)
Hemoglobin: 10.3 g/dL — ABNORMAL LOW (ref 12.0–15.0)
Lymphocytes Relative: 13 % (ref 12–46)
Lymphs Abs: 0.8 10*3/uL (ref 0.7–4.0)
MCH: 32.5 pg (ref 26.0–34.0)
MCV: 100.3 fL — ABNORMAL HIGH (ref 78.0–100.0)
Monocytes Absolute: 0.6 10*3/uL (ref 0.1–1.0)
Monocytes Relative: 10 % (ref 3–12)
Neutrophils Relative %: 76 % (ref 43–77)
RBC: 3.17 MIL/uL — ABNORMAL LOW (ref 3.87–5.11)
WBC: 6 10*3/uL (ref 4.0–10.5)

## 2013-10-19 LAB — COMPREHENSIVE METABOLIC PANEL
AST: 27 U/L (ref 0–37)
Albumin: 4.2 g/dL (ref 3.5–5.2)
BUN: 44 mg/dL — ABNORMAL HIGH (ref 6–23)
Calcium: 14.8 mg/dL (ref 8.4–10.5)
Chloride: 105 mEq/L (ref 96–112)
Creatinine, Ser: 2.51 mg/dL — ABNORMAL HIGH (ref 0.50–1.10)
GFR calc Af Amer: 20 mL/min — ABNORMAL LOW (ref >90)
GFR calc non Af Amer: 17 mL/min — ABNORMAL LOW (ref >90)
Glucose, Bld: 88 mg/dL (ref 70–99)
Sodium: 137 mEq/L (ref 135–145)
Total Bilirubin: 0.8 mg/dL (ref 0.3–1.2)
Total Protein: 7.4 g/dL (ref 6.0–8.3)

## 2013-10-19 MED ORDER — ZOLEDRONIC ACID 4 MG/5ML IV CONC
2.0000 mg | Freq: Once | INTRAVENOUS | Status: AC
  Administered 2013-10-19: 2 mg via INTRAVENOUS

## 2013-10-19 MED ORDER — SODIUM CHLORIDE 0.9 % IV SOLN
Freq: Once | INTRAVENOUS | Status: AC
  Administered 2013-10-19: 12:00:00 via INTRAVENOUS

## 2013-10-19 MED ORDER — SPIRONOLACTONE 50 MG PO TABS: 50.0000 mg | ORAL_TABLET | Freq: Every day | ORAL | Status: AC

## 2013-10-19 MED ORDER — HEPARIN SOD (PORK) LOCK FLUSH 100 UNIT/ML IV SOLN
500.0000 [IU] | Freq: Once | INTRAVENOUS | Status: AC
  Administered 2013-10-19: 500 [IU] via INTRAVENOUS

## 2013-10-19 MED ORDER — POTASSIUM CHLORIDE CRYS ER 20 MEQ PO TBCR: 20.0000 meq | EXTENDED_RELEASE_TABLET | Freq: Every day | ORAL | Status: AC

## 2013-10-19 MED ORDER — SODIUM CHLORIDE 0.9 % IV SOLN
Freq: Once | INTRAVENOUS | Status: AC
  Administered 2013-10-19: 11:00:00 via INTRAVENOUS

## 2013-10-19 MED ORDER — ZOLEDRONIC ACID 4 MG/5ML IV CONC
2.0000 mg | Freq: Once | INTRAVENOUS | Status: DC
  Filled 2013-10-19: qty 2.5

## 2013-10-19 MED ORDER — FOLIC ACID 1 MG PO TABS: 1.0000 mg | ORAL_TABLET | Freq: Every day | ORAL | Status: AC

## 2013-10-19 MED ORDER — HEPARIN SOD (PORK) LOCK FLUSH 100 UNIT/ML IV SOLN
INTRAVENOUS | Status: AC
  Filled 2013-10-19: qty 5

## 2013-10-19 MED ORDER — SODIUM CHLORIDE 0.9 % IV SOLN: INTRAVENOUS | Status: AC

## 2013-10-19 NOTE — Patient Instructions (Signed)
.  [facility] Discharge Instructions  RECOMMENDATIONS MADE BY THE CONSULTANT AND ANY TEST RESULTS WILL BE SENT TO YOUR REFERRING PHYSICIAN.  EXAM FINDINGS BY THE PHYSICIAN TODAY AND SIGNS OR SYMPTOMS TO REPORT TO CLINIC OR PRIMARY PHYSICIAN: Exam and findings as discussed by T. Jacalyn Lefevre PA.  MEDICATIONS PRESCRIBED:  Zometa 2 mg today IV for elevated calcium level  INSTRUCTIONS/FOLLOW-UP: Plan of care discussed. We will recheck labs on Monday January 5th and treat accordingly.  Thank you for choosing [facility] to provide your oncology and hematology care.  To afford each patient quality time with our providers, please arrive at least 15 minutes before your scheduled appointment time.  With your help, our goal is to use those 15 minutes to complete the necessary work-up to ensure our physicians have the information they need to help with your evaluation and healthcare recommendations.    Effective January 1st, 2014, we ask that you re-schedule your appointment with our physicians should you arrive 10 or more minutes late for your appointment.  We strive to give you quality time with our providers, and arriving late affects you and other patients whose appointments are after yours.    Again, thank you for choosing [facility].  Our hope is that these requests will decrease the amount of time that you wait before being seen by our physicians.       _____________________________________________________________  Should you have questions after your visit to [facility], please contact our office at [phone] between the hours of 8:30 a.m. and 5:00 p.m.  Voicemails left after 4:30 p.m. will not be returned until the following business day.  For prescription refill requests, have your pharmacy contact our office with your prescription refill request.

## 2013-10-19 NOTE — Patient Instructions (Signed)
[facility] Discharge Instructions  RECOMMENDATIONS MADE BY THE CONSULTANT AND ANY TEST RESULTS WILL BE SENT TO YOUR REFERRING PHYSICIAN.  MEDICATIONS PRESCRIBED:  none  INSTRUCTIONS GIVEN AND DISCUSSED: Consider options:  1. Treatment with Velcade injections and Dexamethasone steroid pills  2. Hospice/Comfort care  SPECIAL INSTRUCTIONS/FOLLOW-UP: We will do whatever you want and will support any decision you make. Labs in 1 week and follow-up appointment in 1-2 weeks. If you come to a decision regarding what you would like to do, please let us know so we can facilitate sooner, rather than later for you.   Thank you for choosing [facility] to provide your oncology and hematology care.  To afford each patient quality time with our providers, please arrive at least 15 minutes before your scheduled appointment time.  With your help, our goal is to use those 15 minutes to complete the necessary work-up to ensure our physicians have the information they need to help with your evaluation and healthcare recommendations.    Effective January 1st, 2014, we ask that you re-schedule your appointment with our physicians should you arrive 10 or more minutes late for your appointment.  We strive to give you quality time with our providers, and arriving late affects you and other patients whose appointments are after yours.    Again, thank you for choosing [facility].  Our hope is that these requests will decrease the amount of time that you wait before being seen by our physicians.       _____________________________________________________________  Should you have questions after your visit to [facility], please contact our office at [phone] between the hours of 8:30 a.m. and 5:00 p.m.  Voicemails left after 4:30 p.m. will not be returned until the following business day.  For prescription refill requests, have your pharmacy contact  our office with your prescription refill request.

## 2013-10-19 NOTE — Progress Notes (Signed)
Labs drawn today for cbc/diff,cmp 

## 2013-10-19 NOTE — Progress Notes (Signed)
Tolerated well

## 2013-10-19 NOTE — Progress Notes (Signed)
[name] is seen as a work-in today.  [name] has multiple myeloma: Patient initially presented with disease nearly 8 years ago. Her lambda light chains since July 2014 have slowly risen, but she previously expressed no interest in reinitiating treatments.  Now, suddenly, she was noted to be hypercalcemic on 10/02/13.  She was treated with Zometa and repeat labs 7-14 days later.  Repeat labs today demonstrate hypercalcemia that is even higher at 14.8, despite Zometa infusion.   As a result, her renal function is declining.   She is receiving 2 mg of Zometa again today in addition to fluids.   I personally reviewed and went over laboratory results with the patient.  I reviewed labs with the patient and [name], son.    Using her lab results, I demonstrated progression of her multiple myeloma.  I provided her education regarding the definition of results and the correlation of her multiple myeloma with her hypercalcemia and renal function decline.   I have provided [name] with two options: treatment versus Hospice.  With treatment, I have offered her Velcade + Dexamethasone.  She has received this therapy in the past and failed.  Additionally, she has grade 2-3 peripheral neuropathy from past therapy and this may worsen this chemotherapy-induced adverse effect.  The goal of this therapy is to prolong life and she may, or may not, respond to therapy.   Hospice is her other option.  We had a long conversation regarding Hospice vs Chemotherapy.  The patient is aware that chemotherapy may adversely affect quality of life and may not prove to be beneficial.  Patient education was given regarding Hospice and the services they provide.  The patient understands that studies report that early enrollment in Hospice actually allows the patient to live longer compared to those who enroll in Hospice nearer to end of life.  The patient knows that Hospice will not cover chemotherapy.  Hospice will allow the patient to stay at  home at end of life or go to a facility for end of life care.  At this point, the patient would like to remain at home.  Hospice provides the patient with a team of providers to help with care including physicians, nurses, aids, chaplains, and social workers.  Hospice's goal is to keep patients out of the hospital and and comfortable by controlling symptoms with medications.  The patient is certainly Hospice appropriate with a life expectancy of less than 6 months, but closer to weeks - 2 months. The patient understands that he can be discharged from Hospice at anytime.   Hospice may benefit the [place] family more so than the patient and she was informed of this added benefit.  With this information, she wishes to consider her options and she will let us know.  She made it very clear today during conversation that she wants to be a DNR.  The DNR form is signed and [facility] order placed.   [name] wanted to know what would happen to his mom if she decided not to pursue therapy.  With her hypercalcemia, she will experience mental status changes leading to coma.  In addition to this, her renal function will continue to decline causing renal failure and increasing ammonia levels.  This would reinforce her coma state and she would pass in her sleep, hopefully comfortably.  Now this is just one scenario, yet a very likely one.  [name] seems to support the comfort care approach.    [name] asks if I can formulate a letter  to get Larose excused from [employer].  He also asks for a letter summarizing our conversation today so he can provide it to his other siblings to help with decision making.   Plan: 1. Labs today: CBC diff, CMET 2. 2 mg of Zometa IV today 3. 1L NS today 4. Labs in 1 week: CBC diff, CMET 5. Discussion regarding options: treatment versus Hospice 6. Velcade + Dexamethasone would be the treatment of choice at this time.  Goal of treatment is palliative only. 7. Hospice is appropriate and this option was  discussed in detail. 8. DNR form completed.  Order placed in [facility]L. 9. Letter excusing [place] from jury duty completed and printed.  10. Letter summarizing discussion today typed and printed.  11. Patient will consider options and let us know her choice.   Patient and plan discussed with Dr. Alla German and he is in agreement with the aforementioned.   More than 50% of the time spent with the patient was utilized for counseling and coordination of care.  KEFALAS,THOMAS

## 2013-10-19 NOTE — Telephone Encounter (Signed)
Per pt's son, [name] needs refills for the following, and he also requests that refills be given on each if possible.   folic acid 1 MG tablet  Commonly known as:  FOLVITE  Take 1 tablet (1 mg total) by mouth daily.     potassium chloride SA 20 MEQ tablet  Commonly known as:  K-DUR,KLOR-CON  Take 1 tablet (20 mEq total) by mouth daily.     spironolactone 50 MG tablet  Commonly known as:  ALDACTONE  Take 1 tablet (50 mg total) by mouth daily.

## 2013-10-26 ENCOUNTER — Encounter (HOSPITAL_BASED_OUTPATIENT_CLINIC_OR_DEPARTMENT_OTHER): Payer: Medicare Other | Admitting: Oncology

## 2013-10-26 ENCOUNTER — Encounter (HOSPITAL_COMMUNITY): Payer: Medicare Other

## 2013-10-26 ENCOUNTER — Encounter (HOSPITAL_COMMUNITY): Payer: Self-pay | Admitting: Oncology

## 2013-10-26 ENCOUNTER — Encounter (HOSPITAL_COMMUNITY): Payer: Medicare Other | Attending: Oncology

## 2013-10-26 VITALS — BP 133/79 | HR 87 | Temp 98.0°F | Resp 20

## 2013-10-26 DIAGNOSIS — C9000 Multiple myeloma not having achieved remission: Secondary | ICD-10-CM

## 2013-10-26 LAB — CBC WITH DIFFERENTIAL/PLATELET
BASOS ABS: 0 10*3/uL (ref 0.0–0.1)
Basophils Relative: 1 % (ref 0–1)
EOS PCT: 2 % (ref 0–5)
Eosinophils Absolute: 0.1 10*3/uL (ref 0.0–0.7)
HEMATOCRIT: 32 % — AB (ref 36.0–46.0)
HEMOGLOBIN: 10.4 g/dL — AB (ref 12.0–15.0)
LYMPHS ABS: 0.8 10*3/uL (ref 0.7–4.0)
LYMPHS PCT: 13 % (ref 12–46)
MCH: 32.5 pg (ref 26.0–34.0)
MCHC: 32.5 g/dL (ref 30.0–36.0)
MCV: 100 fL (ref 78.0–100.0)
MONO ABS: 0.8 10*3/uL (ref 0.1–1.0)
MONOS PCT: 12 % (ref 3–12)
NEUTROS ABS: 4.6 10*3/uL (ref 1.7–7.7)
Neutrophils Relative %: 73 % (ref 43–77)
Platelets: 135 10*3/uL — ABNORMAL LOW (ref 150–400)
RBC: 3.2 MIL/uL — ABNORMAL LOW (ref 3.87–5.11)
RDW: 15.6 % — AB (ref 11.5–15.5)
WBC: 6.3 10*3/uL (ref 4.0–10.5)

## 2013-10-26 LAB — COMPREHENSIVE METABOLIC PANEL
ALT: 12 U/L (ref 0–35)
AST: 31 U/L (ref 0–37)
Albumin: 4.2 g/dL (ref 3.5–5.2)
Alkaline Phosphatase: 77 U/L (ref 39–117)
BUN: 31 mg/dL — AB (ref 6–23)
CALCIUM: 15 mg/dL — AB (ref 8.4–10.5)
CHLORIDE: 102 meq/L (ref 96–112)
CO2: 20 mEq/L (ref 19–32)
CREATININE: 2.51 mg/dL — AB (ref 0.50–1.10)
GFR calc non Af Amer: 17 mL/min — ABNORMAL LOW (ref >90)
GFR, EST AFRICAN AMERICAN: 20 mL/min — AB (ref >90)
GLUCOSE: 99 mg/dL (ref 70–99)
Potassium: 4.4 mEq/L (ref 3.7–5.3)
Sodium: 137 mEq/L (ref 137–147)
Total Bilirubin: 0.6 mg/dL (ref 0.3–1.2)
Total Protein: 7.4 g/dL (ref 6.0–8.3)

## 2013-10-26 MED ORDER — DEXAMETHASONE 4 MG PO TABS: 20.0000 mg | ORAL_TABLET | Freq: Two times a day (BID) | ORAL | Status: DC

## 2013-10-26 MED ORDER — DEXAMETHASONE 4 MG PO TABS: 20.0000 mg | ORAL_TABLET | ORAL | Status: DC

## 2013-10-26 NOTE — Progress Notes (Signed)
[name] is seen as a work-in today.  Labs demonstrate a calcium > 15.  I personally reviewed and went over laboratory results with the patient.   We had a discussion regarding future options: 1. Vecade + Dex 2. Dex alone 3. Hospice.  We discussed the risks, benefits, alternatives, and side effects of all choices.   We have decided to pursue Dexamethasone for mild multiple myeloma effects and to possibly help with calcium level.   Additionally, [name] is agreeable to Hospice referral.  Therefore, we will do both.  We discussed the potential side effects of Dexamethasone and she understands to stop the medication with any issues.  BP 133/79  Pulse 87  Temp(Src) 98 F (36.7 C)  Resp 20  Assessment: 1. Progressive multiple myeloma, refractory to treatment 2. Hypercalcemia 3. Progressive worsening renal function  Plan: 1. Dexamethasone 20 mg PO weekly 2. Referral to hospice 3. Cancel upcoming appointment 4. Return PRN  Patient and plan discussed with Dr. Farrel Gobble and he is in agreement with the aforementioned.   More than 50% of the time spent with the patient was utilized for counseling and coordination of care.  KEFALAS,THOMAS

## 2013-10-26 NOTE — Progress Notes (Signed)
Labs drawn today for cbc/diff,cmp 

## 2013-10-26 NOTE — Patient Instructions (Signed)
.  [facility] Discharge Instructions  RECOMMENDATIONS MADE BY THE CONSULTANT AND ANY TEST RESULTS WILL BE SENT TO YOUR REFERRING PHYSICIAN.  EXAM FINDINGS BY THE PHYSICIAN TODAY AND SIGNS OR SYMPTOMS TO REPORT TO CLINIC OR PRIMARY PHYSICIAN: Exam and findings as discussed by T. Sheldon Silvan PA. We will call Hospice for referral. They should call you to come out and see you in the next few days. MEDICATIONS PRESCRIBED:  Dexamethasone 20 mg weekly has been sent to your drug store and we requested delivery to your house. You can stop this at any time if you choose.  INSTRUCTIONS/FOLLOW-UP: As needed.  Thank you for choosing [facility] to provide your oncology and hematology care.  To afford each patient quality time with our providers, please arrive at least 15 minutes before your scheduled appointment time.  With your help, our goal is to use those 15 minutes to complete the necessary work-up to ensure our physicians have the information they need to help with your evaluation and healthcare recommendations.    Effective January 1st, 2014, we ask that you re-schedule your appointment with our physicians should you arrive 10 or more minutes late for your appointment.  We strive to give you quality time with our providers, and arriving late affects you and other patients whose appointments are after yours.    Again, thank you for choosing [facility].  Our hope is that these requests will decrease the amount of time that you wait before being seen by our physicians.       _____________________________________________________________  Should you have questions after your visit to [facility], please contact our office at [phone] between the hours of 8:30 a.m. and 5:00 p.m.  Voicemails left after 4:30 p.m. will not be returned until the following business day.  For prescription refill requests, have your pharmacy contact our office with  your prescription refill request.

## 2013-10-30 ENCOUNTER — Ambulatory Visit (HOSPITAL_COMMUNITY): Payer: Medicare Other

## 2013-11-02 ENCOUNTER — Other Ambulatory Visit (HOSPITAL_COMMUNITY): Payer: Self-pay | Admitting: Oncology

## 2013-11-02 DIAGNOSIS — M545 Low back pain: Secondary | ICD-10-CM

## 2013-11-02 DIAGNOSIS — C9000 Multiple myeloma not having achieved remission: Secondary | ICD-10-CM

## 2013-11-02 DIAGNOSIS — G8929 Other chronic pain: Secondary | ICD-10-CM

## 2013-11-02 DIAGNOSIS — S32599S Other specified fracture of unspecified pubis, sequela: Secondary | ICD-10-CM

## 2013-11-02 MED ORDER — OXYCODONE HCL ER 20 MG PO T12A: 20.0000 mg | EXTENDED_RELEASE_TABLET | Freq: Two times a day (BID) | ORAL | Status: DC

## 2013-11-16 ENCOUNTER — Other Ambulatory Visit (HOSPITAL_COMMUNITY): Payer: Self-pay | Admitting: Oncology

## 2013-11-16 DIAGNOSIS — M545 Low back pain, unspecified: Secondary | ICD-10-CM

## 2013-11-16 DIAGNOSIS — G8929 Other chronic pain: Secondary | ICD-10-CM

## 2013-11-16 DIAGNOSIS — C9000 Multiple myeloma not having achieved remission: Secondary | ICD-10-CM

## 2013-11-16 IMAGING — CR DG CHEST 1V
1 series · 1 of 1 positions shown · non-contrast
Comparison: Portable chest x-ray of 03/08/2012

CLINICAL DATA: Cough

CHEST - 1 VIEW

[view not recorded]
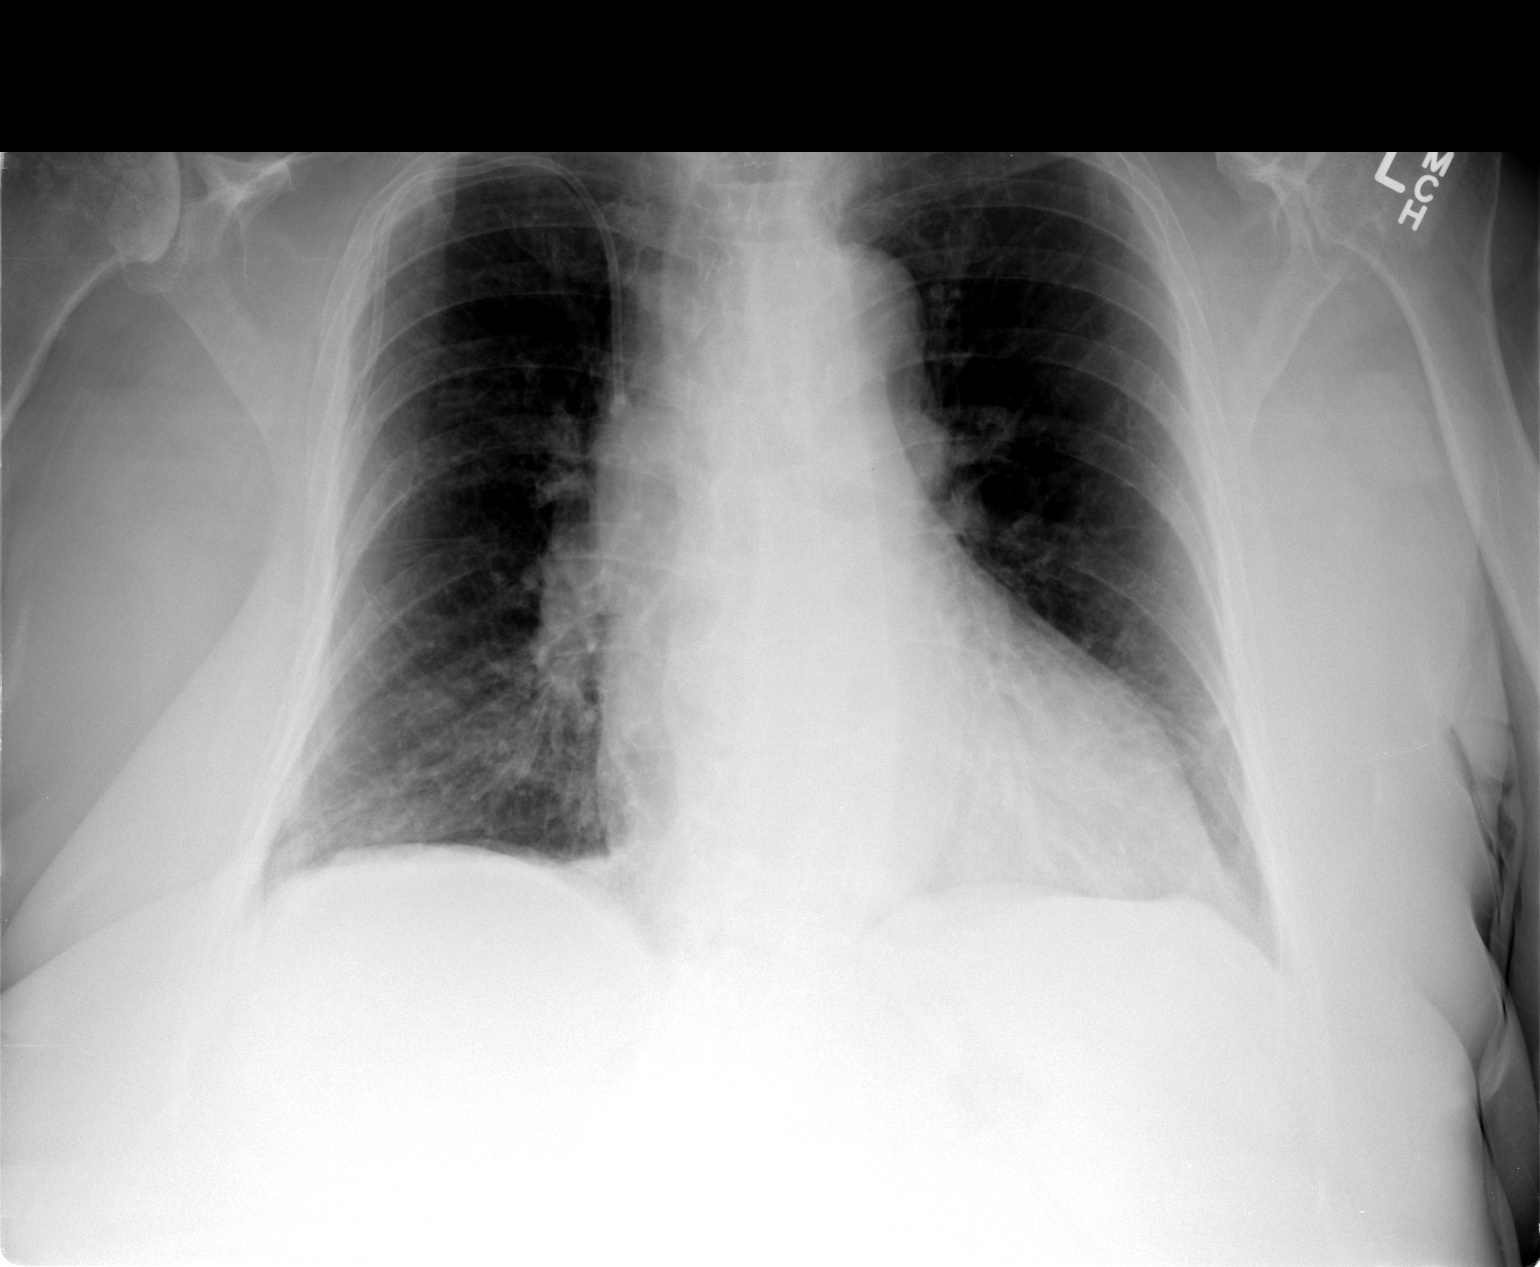

[1 of 1 positions shown; findings below may reference images not displayed]

FINDINGS: The lungs are clear.  Cardiomegaly is stable.  Right-
sided Port-A-Cath remains with the tip in the upper SVC.  No acute
bony abnormality is seen.
IMPRESSION: Stable cardiomegaly.  No active lung disease.

## 2013-11-16 MED ORDER — OXYCODONE HCL ER 20 MG PO T12A: 20.0000 mg | EXTENDED_RELEASE_TABLET | Freq: Three times a day (TID) | ORAL | Status: DC

## 2013-11-24 ENCOUNTER — Other Ambulatory Visit (HOSPITAL_COMMUNITY): Payer: Self-pay | Admitting: Oncology

## 2013-11-24 DIAGNOSIS — M545 Low back pain: Secondary | ICD-10-CM

## 2013-11-24 DIAGNOSIS — C9000 Multiple myeloma not having achieved remission: Secondary | ICD-10-CM

## 2013-11-24 DIAGNOSIS — G8929 Other chronic pain: Secondary | ICD-10-CM

## 2013-11-24 MED ORDER — OXYCODONE HCL ER 20 MG PO T12A: 20.0000 mg | EXTENDED_RELEASE_TABLET | Freq: Three times a day (TID) | ORAL | Status: DC

## 2013-11-26 ENCOUNTER — Other Ambulatory Visit (HOSPITAL_COMMUNITY): Payer: Self-pay | Admitting: Oncology

## 2013-11-26 DIAGNOSIS — C9000 Multiple myeloma not having achieved remission: Secondary | ICD-10-CM

## 2013-11-26 MED ORDER — HYDROCODONE-ACETAMINOPHEN 10-325 MG PO TABS: 1.0000 | ORAL_TABLET | Freq: Four times a day (QID) | ORAL | Status: DC | PRN

## 2013-11-27 ENCOUNTER — Ambulatory Visit (HOSPITAL_COMMUNITY): Payer: Medicare Other

## 2013-11-27 ENCOUNTER — Ambulatory Visit (HOSPITAL_COMMUNITY): Payer: Medicare Other | Admitting: Oncology

## 2013-11-30 ENCOUNTER — Other Ambulatory Visit (HOSPITAL_COMMUNITY): Payer: Self-pay | Admitting: Oncology

## 2013-11-30 DIAGNOSIS — G8929 Other chronic pain: Secondary | ICD-10-CM

## 2013-11-30 DIAGNOSIS — M545 Low back pain: Secondary | ICD-10-CM

## 2013-11-30 DIAGNOSIS — C9000 Multiple myeloma not having achieved remission: Secondary | ICD-10-CM

## 2013-11-30 MED ORDER — OXYCODONE HCL ER 20 MG PO T12A: 20.0000 mg | EXTENDED_RELEASE_TABLET | Freq: Three times a day (TID) | ORAL | Status: DC

## 2013-12-04 ENCOUNTER — Ambulatory Visit (HOSPITAL_COMMUNITY): Payer: Medicare Other | Admitting: Oncology

## 2013-12-07 ENCOUNTER — Other Ambulatory Visit (HOSPITAL_COMMUNITY): Payer: Self-pay | Admitting: Oncology

## 2013-12-07 DIAGNOSIS — G8929 Other chronic pain: Secondary | ICD-10-CM

## 2013-12-07 DIAGNOSIS — C9000 Multiple myeloma not having achieved remission: Secondary | ICD-10-CM

## 2013-12-07 DIAGNOSIS — M545 Low back pain: Secondary | ICD-10-CM

## 2013-12-07 MED ORDER — OXYCODONE HCL ER 20 MG PO T12A: 20.0000 mg | EXTENDED_RELEASE_TABLET | Freq: Three times a day (TID) | ORAL | Status: DC

## 2013-12-07 MED ORDER — HYDROCODONE-ACETAMINOPHEN 10-325 MG PO TABS: 1.0000 | ORAL_TABLET | Freq: Four times a day (QID) | ORAL | Status: DC | PRN

## 2013-12-11 ENCOUNTER — Other Ambulatory Visit (HOSPITAL_COMMUNITY): Payer: Self-pay | Admitting: Oncology

## 2013-12-11 DIAGNOSIS — C9000 Multiple myeloma not having achieved remission: Secondary | ICD-10-CM

## 2013-12-11 DIAGNOSIS — G8929 Other chronic pain: Secondary | ICD-10-CM

## 2013-12-11 DIAGNOSIS — M545 Low back pain: Secondary | ICD-10-CM

## 2013-12-11 MED ORDER — OXYCODONE HCL ER 40 MG PO T12A: 40.0000 mg | EXTENDED_RELEASE_TABLET | Freq: Three times a day (TID) | ORAL | Status: DC

## 2013-12-14 ENCOUNTER — Telehealth (HOSPITAL_COMMUNITY): Payer: Self-pay | Admitting: Oncology

## 2013-12-14 NOTE — Telephone Encounter (Addendum)
Patient's daughter called regarding Marise.  Both legs are bothering her.  Took a long time for the patient to get her to the "bathroom." She notes that her legs are numb.  I worry about cord compression secondary to multiple myeloma progression  Additionally, she has developed another large "bump" at temple of skull.   The patient reports that she is unable to care for herself at home and would like to be admitted to the Hospice home.  Unfortunately, Thayer Headings (daughter) reports that the nurse says she is not a candidate for inpatient hospice, however, skilled nursing home with Hospice is an option.    Thayer Headings is upset with this information and she does not want Mother in a skilled nursing facility.  She notes, "all they do there is put mother on the bed pain and that's it!"  I informed her that the inpatient Hospice Unit will not be able to provide any additional services other than comfort care.  I am not sure what Janice's expectation of inpatient hospice is, but they sound unrealistic and unreasonable.    Since I am getting all of this information second, maybe third-hand, I have reached out to [place] (hospice nurse).  I have spoken to [facility] Nurse).  She reports that the patient has declined with increased weakness, but she is not inpatient Hospice appropriate.  She notes increased deconditioning and the patient is essentially bedbound.  In the nurses opinion, the patient is not declining enough to be an inpatient Hospice candidate.  Additionally, the hospice nurse notes that the patient has declined leaving her house.  The nurse is agreeable to continue to evaluate the patient for consideration of inpatient Hospice.  She has recommended Social Worker to help with the case.  Unfortunately, she is fearful that the patient's family are becoming overwhelmed with caring for the bedbound patient.  Hospice does offer a 5 day respite admission, but this does not solve the patient's family's desires for  admission to inpatient Hospice.  Skilled nursing admission was discussed, but family refused.  I have reconnected with [facility] [place].  I provided her with the information that the Hospice Nurse has provided.  Thayer Headings was much more receptive by the end of our conversation with the information I provided.  She was appreciative.  All questions were answered to her satisfaction.  Johany Hansman

## 2013-12-15 ENCOUNTER — Telehealth (HOSPITAL_COMMUNITY): Payer: Self-pay | Admitting: Oncology

## 2013-12-15 NOTE — Telephone Encounter (Signed)
Patient's granddaughter, Erline Levine called with a question.  Anwita is having more fluid accumulation in ankles and feet.  I recommended the following: 1. Elevation of LE 2. Increase Spirolactone to 100 mg daily (compared to 50 mg).  This raises another issue that is being discussed about family members.  By increasing this medication, on would expect increased urination.  Nia is having a difficult time getting to the bathroom due to weakness and difficulty ambulating and transferring.  A foley cathter placement has been mentioned and the patient is agreeable.  Hospice plans on visiting later this week for Foley catheter placement.  Sonia Side, the patient's son, is worried about increased risk of infection with Foley catheter placement.  I provided Marzetta Board with my support for the Foley catheter from a comfort and dignity standpoint.  This will provide her with increased dignity and alleviate her worry about making it to a toilet in a timely fashion.  She can spend more quality time with family members and loved ones without that concern.  The option remains to hold off on increasing Aldactone until Foley is placed.  I will defer to Leticia, family members, and Hospice.  Erline Levine notes a decline in Dexter.  She is more tired and weak.  She sleep more.  She notes increased confusion which is likely from hypercalcemia.  Her LE edema, may be an indication of worsening renal function secondary to hypercalcemia as well.    I had a nice pep talk with Stacy.    Kyngston Pickelsimer

## 2013-12-21 ENCOUNTER — Other Ambulatory Visit (HOSPITAL_COMMUNITY): Payer: Self-pay | Admitting: Oncology

## 2013-12-21 DIAGNOSIS — C9000 Multiple myeloma not having achieved remission: Secondary | ICD-10-CM

## 2013-12-21 MED ORDER — HYDROCODONE-ACETAMINOPHEN 10-325 MG PO TABS: 1.0000 | ORAL_TABLET | Freq: Four times a day (QID) | ORAL | Status: DC | PRN

## 2013-12-23 ENCOUNTER — Other Ambulatory Visit (HOSPITAL_COMMUNITY): Payer: Self-pay | Admitting: Oncology

## 2013-12-23 ENCOUNTER — Telehealth (HOSPITAL_COMMUNITY): Payer: Self-pay | Admitting: Oncology

## 2013-12-23 DIAGNOSIS — C9000 Multiple myeloma not having achieved remission: Secondary | ICD-10-CM

## 2013-12-23 MED ORDER — FENTANYL 75 MCG/HR TD PT72: 75.0000 ug | MEDICATED_PATCH | TRANSDERMAL | Status: DC

## 2013-12-23 MED ORDER — OXYCODONE HCL 5 MG/5ML PO SOLN: 5.0000 mg | ORAL | Status: DC | PRN

## 2013-12-23 NOTE — Telephone Encounter (Signed)
The patient's granddaughter reports Hydrocodone is missing.  hospice called reporting that this is happening occasionally and there are a lot of people in and out of the patient's house.  For the patient's safety, they recommend switching to Fentanyl and liquid oxycodone.  I will switch her to 75 mcg/hr of fentanyl and oxycodone liquid 5-10 mg every 3 hours PRN.  She is to start the patch today and continue Oxycontin until tomorrow afternoon and then  Stop the oxycontin.  Hospice has recommended inpatient Hospice, which if you look back at previous telephone encounters, the family wanted that.  However, the family has declined that opportunity at this time.   KEFALAS,THOMAS

## 2013-12-24 ENCOUNTER — Telehealth (HOSPITAL_COMMUNITY): Payer: Self-pay | Admitting: Oncology

## 2013-12-24 DIAGNOSIS — N898 Other specified noninflammatory disorders of vagina: Secondary | ICD-10-CM

## 2013-12-24 MED ORDER — NITROFURANTOIN MONOHYD MACRO 100 MG PO CAPS: 100.0000 mg | ORAL_CAPSULE | Freq: Two times a day (BID) | ORAL | Status: AC

## 2013-12-24 MED ORDER — FLUCONAZOLE 100 MG PO TABS: ORAL_TABLET | ORAL | Status: AC

## 2013-12-24 NOTE — Telephone Encounter (Signed)
Patient's grand-daughter reports perineal odor.  Ron, Hospice nurse evaluated and cannot find any open areas of indication of vaginal discharge.    Will treat empirically with Diflucan 100 mg, 2 tabs today and 1 tab thereafter x 6 days.  Will also prescribe Macrobid for possible UTI.  Both medications escribed to [facility].  [name]

## 2013-12-29 ENCOUNTER — Other Ambulatory Visit (HOSPITAL_COMMUNITY): Payer: Self-pay | Admitting: Oncology

## 2013-12-29 DIAGNOSIS — C9000 Multiple myeloma not having achieved remission: Secondary | ICD-10-CM

## 2013-12-29 MED ORDER — OXYCODONE HCL 5 MG/5ML PO SOLN: 5.0000 mg | ORAL | Status: DC | PRN

## 2014-01-01 ENCOUNTER — Other Ambulatory Visit (HOSPITAL_COMMUNITY): Payer: Self-pay | Admitting: Oncology

## 2014-01-01 ENCOUNTER — Telehealth (HOSPITAL_COMMUNITY): Payer: Self-pay | Admitting: Oncology

## 2014-01-01 DIAGNOSIS — B49 Unspecified mycosis: Secondary | ICD-10-CM

## 2014-01-01 DIAGNOSIS — R112 Nausea with vomiting, unspecified: Secondary | ICD-10-CM

## 2014-01-01 DIAGNOSIS — C9000 Multiple myeloma not having achieved remission: Secondary | ICD-10-CM

## 2014-01-01 MED ORDER — FENTANYL 100 MCG/HR TD PT72: 100.0000 ug | MEDICATED_PATCH | TRANSDERMAL | Status: DC

## 2014-01-01 MED ORDER — CLOTRIMAZOLE-BETAMETHASONE 1-0.05 % EX CREA: TOPICAL_CREAM | Freq: Two times a day (BID) | CUTANEOUS | Status: AC

## 2014-01-01 MED ORDER — PROMETHAZINE HCL 25 MG PO TABS: 25.0000 mg | ORAL_TABLET | Freq: Four times a day (QID) | ORAL | Status: AC | PRN
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
## 2014-01-04 ENCOUNTER — Other Ambulatory Visit (HOSPITAL_COMMUNITY): Payer: Self-pay | Admitting: Oncology

## 2014-01-04 DIAGNOSIS — C9000 Multiple myeloma not having achieved remission: Secondary | ICD-10-CM

## 2014-01-04 MED ORDER — FENTANYL 100 MCG/HR TD PT72: 100.0000 ug | MEDICATED_PATCH | TRANSDERMAL | Status: AC

## 2014-01-06 ENCOUNTER — Other Ambulatory Visit (HOSPITAL_COMMUNITY): Payer: Self-pay | Admitting: Oncology

## 2014-01-06 DIAGNOSIS — C9000 Multiple myeloma not having achieved remission: Secondary | ICD-10-CM

## 2014-01-06 MED ORDER — OXYCODONE HCL 20 MG/ML PO CONC: 5.0000 mg | ORAL | Status: DC | PRN

## 2014-01-08 ENCOUNTER — Other Ambulatory Visit (HOSPITAL_COMMUNITY): Payer: Self-pay | Admitting: Oncology

## 2014-01-08 DIAGNOSIS — C9000 Multiple myeloma not having achieved remission: Secondary | ICD-10-CM

## 2014-01-08 MED ORDER — OXYCODONE HCL 20 MG/ML PO CONC: 5.0000 mg | ORAL | Status: AC | PRN

## 2014-01-20 DEATH — deceased

## 2014-06-15 ENCOUNTER — Other Ambulatory Visit: Payer: Self-pay | Admitting: Pharmacist
